# Patient Record
Sex: Male | Born: 1955
Health system: Southern US, Community
[De-identification: ages and names within clinical notes are randomized; demographics above are authoritative.]

## PROBLEM LIST (undated history)

## (undated) DIAGNOSIS — I77810 Thoracic aortic ectasia: Secondary | ICD-10-CM

## (undated) DIAGNOSIS — I4892 Unspecified atrial flutter: Secondary | ICD-10-CM

## (undated) DIAGNOSIS — G4733 Obstructive sleep apnea (adult) (pediatric): Secondary | ICD-10-CM

## (undated) DIAGNOSIS — I420 Dilated cardiomyopathy: Secondary | ICD-10-CM

## (undated) DIAGNOSIS — I4819 Other persistent atrial fibrillation: Secondary | ICD-10-CM

## (undated) DIAGNOSIS — I10 Essential (primary) hypertension: Secondary | ICD-10-CM

## (undated) HISTORY — DX: Obstructive sleep apnea (adult) (pediatric): G47.33

## (undated) HISTORY — DX: Thoracic aortic ectasia: I77.810

## (undated) HISTORY — DX: Unspecified atrial flutter: I48.92

## (undated) HISTORY — DX: Other persistent atrial fibrillation: I48.19

## (undated) HISTORY — DX: Dilated cardiomyopathy: I42.0

## (undated) HISTORY — DX: Morbid (severe) obesity due to excess calories: E66.01

## (undated) HISTORY — DX: Essential (primary) hypertension: I10

---

## 1999-02-15 ENCOUNTER — Emergency Department (HOSPITAL_COMMUNITY): Admission: EM | Admit: 1999-02-15 | Discharge: 1999-02-15 | Payer: Self-pay | Admitting: Emergency Medicine

## 2000-05-20 ENCOUNTER — Encounter: Payer: Self-pay | Admitting: Internal Medicine

## 2000-05-20 ENCOUNTER — Encounter: Admission: RE | Admit: 2000-05-20 | Discharge: 2000-05-20 | Payer: Self-pay | Admitting: Internal Medicine

## 2000-09-25 ENCOUNTER — Inpatient Hospital Stay (HOSPITAL_COMMUNITY): Admission: EM | Admit: 2000-09-25 | Discharge: 2000-09-27 | Payer: Self-pay | Admitting: Emergency Medicine

## 2000-09-25 ENCOUNTER — Encounter: Payer: Self-pay | Admitting: Emergency Medicine

## 2004-04-10 ENCOUNTER — Ambulatory Visit (HOSPITAL_COMMUNITY): Admission: RE | Admit: 2004-04-10 | Discharge: 2004-04-10 | Payer: Self-pay | Admitting: Gastroenterology

## 2012-04-27 ENCOUNTER — Ambulatory Visit (HOSPITAL_BASED_OUTPATIENT_CLINIC_OR_DEPARTMENT_OTHER): Payer: PRIVATE HEALTH INSURANCE

## 2012-04-28 ENCOUNTER — Ambulatory Visit (HOSPITAL_BASED_OUTPATIENT_CLINIC_OR_DEPARTMENT_OTHER): Payer: PRIVATE HEALTH INSURANCE | Attending: Internal Medicine | Admitting: Radiology

## 2012-04-28 VITALS — Ht 72.0 in | Wt 250.0 lb

## 2012-04-28 DIAGNOSIS — G4733 Obstructive sleep apnea (adult) (pediatric): Secondary | ICD-10-CM | POA: Insufficient documentation

## 2012-04-28 DIAGNOSIS — Z6833 Body mass index (BMI) 33.0-33.9, adult: Secondary | ICD-10-CM | POA: Insufficient documentation

## 2012-04-28 DIAGNOSIS — E669 Obesity, unspecified: Secondary | ICD-10-CM | POA: Insufficient documentation

## 2012-05-03 DIAGNOSIS — G4733 Obstructive sleep apnea (adult) (pediatric): Secondary | ICD-10-CM

## 2012-05-03 DIAGNOSIS — R0609 Other forms of dyspnea: Secondary | ICD-10-CM

## 2012-05-03 DIAGNOSIS — R0989 Other specified symptoms and signs involving the circulatory and respiratory systems: Secondary | ICD-10-CM

## 2012-05-04 NOTE — Procedures (Cosign Needed)
NAME:  Colin [name], Colin [name]                ACCOUNT NO.:  [id number]  MEDICAL RECORD NO.:  [id number]          PATIENT TYPE:  OUT  LOCATION:  SLEEP CENTER                 FACILITY:  [facility]  PHYSICIAN:  Clinton D. Maple Hudson, MD, FCCP, FACPDATE OF BIRTH:  12-07-56  DATE OF STUDY:  04/28/2012                           NOCTURNAL POLYSOMNOGRAM  REFERRING PHYSICIAN:  Merlene Laughter. Renae Gloss, M.D.  INDICATION FOR STUDY:  Hypersomnia with sleep apnea.  EPWORTH SLEEPINESS SCORE:  6/24.  BMI 33.9, weight 250 pounds, height 72 inches, neck 18 inches.  MEDICATIONS:  Home medications are charted and reviewed.  SLEEP ARCHITECTURE:  Split study protocol.  During the diagnostic phase, total sleep time 204.5 minutes with sleep efficiency 87.8%.  Stage I was 5.6%, stage II 72.6%, stage III absent, REM 21.8% of total sleep time. Sleep latency 11 minutes, REM latency 36.5 minutes, awake after sleep onset 12.5 minutes.  Arousal index 13.8.  BEDTIME MEDICATION:  None.  RESPIRATORY DATA:  Split study protocol.  Apnea/hypopnea index (AHI) 14.7 per hour.  A total of 50 events was scored including 2 obstructive apneas and 48 hypopneas.  Events were not positional.  REM AHI 45.8 per hour.  CPAP was titrated to 15 CWP, AHI 0 per hour.  He wore a medium ResMed Mirage Quattro full-face mask with heated humidifier.  OXYGEN DATA:  Before CPAP, snoring was moderately loud with oxygen desaturation to a nadir of 84% on room air.  With CPAP titration, snoring was prevented and mean oxygen saturation held 95.7% on room air.  CARDIAC DATA:  Sinus rhythm with PACs.  MOVEMENT-PARASOMNIA:  No significant movement disturbance.  Bathroom x1.  IMPRESSIONS-RECOMMENDATIONS: 1. Mild obstructive sleep apnea/hypopnea syndrome, AHI 14.7 per hour     with non-positional events.  Moderately loud snoring with oxygen     desaturation to a nadir of 84% on room air. 2. Successful CPAP titration to 15 CWP, AHI 0 per hour.  He wore a     medium  ResMed Mirage Quattro full-face mask with heated humidifier.     Snoring was prevented and mean oxygen saturation held 95.7% on room     air.     Clinton D. Maple Hudson, MD, [facility], FACP Diplomate, American Board of Sleep Medicine    CDY/MEDQ  D:  05/03/2012 16:35:11  T:  05/04/2012 00:54:34  Job:  [id number]

## 2014-09-14 ENCOUNTER — Encounter (HOSPITAL_COMMUNITY): Payer: Self-pay | Admitting: Nurse Practitioner

## 2014-09-14 ENCOUNTER — Other Ambulatory Visit (HOSPITAL_COMMUNITY): Payer: Self-pay | Admitting: *Deleted

## 2014-09-14 ENCOUNTER — Ambulatory Visit (HOSPITAL_COMMUNITY)
Admission: RE | Admit: 2014-09-14 | Discharge: 2014-09-14 | Disposition: A | Discharge status: Home / Self Care | Payer: PRIVATE HEALTH INSURANCE | Source: Ambulatory Visit | Attending: Nurse Practitioner | Admitting: Nurse Practitioner

## 2014-09-14 ENCOUNTER — Other Ambulatory Visit: Payer: Self-pay

## 2014-09-14 VITALS — BP 150/98 | HR 72 | Ht 72.0 in | Wt 252.8 lb

## 2014-09-14 DIAGNOSIS — I4891 Unspecified atrial fibrillation: Secondary | ICD-10-CM | POA: Insufficient documentation

## 2014-09-14 DIAGNOSIS — G4733 Obstructive sleep apnea (adult) (pediatric): Secondary | ICD-10-CM | POA: Diagnosis not present

## 2014-09-14 DIAGNOSIS — R030 Elevated blood-pressure reading, without diagnosis of hypertension: Secondary | ICD-10-CM | POA: Insufficient documentation

## 2014-09-14 DIAGNOSIS — I4892 Unspecified atrial flutter: Secondary | ICD-10-CM

## 2014-09-14 DIAGNOSIS — Z87891 Personal history of nicotine dependence: Secondary | ICD-10-CM | POA: Diagnosis not present

## 2014-09-14 LAB — BASIC METABOLIC PANEL
ANION GAP: 10 (ref 5–15)
BUN: 13 mg/dL (ref 6–20)
CHLORIDE: 104 mmol/L (ref 101–111)
CO2: 25 mmol/L (ref 22–32)
CREATININE: 1.14 mg/dL (ref 0.61–1.24)
Calcium: 9.7 mg/dL (ref 8.9–10.3)
Glucose, Bld: 208 mg/dL — ABNORMAL HIGH (ref 70–99)
Potassium: 4.3 mmol/L (ref 3.5–5.1)
Sodium: 139 mmol/L (ref 135–145)

## 2014-09-14 LAB — CBC
HEMATOCRIT: 45.2 % (ref 39.0–52.0)
Hemoglobin: 15.3 g/dL (ref 13.0–17.0)
MCH: 28 pg (ref 26.0–34.0)
MCHC: 33.8 g/dL (ref 30.0–36.0)
MCV: 82.8 fL (ref 78.0–100.0)
Platelets: 224 10*3/uL (ref 150–400)
RBC: 5.46 MIL/uL (ref 4.22–5.81)
RDW: 14.6 % (ref 11.5–15.5)
WBC: 7.1 10*3/uL (ref 4.0–10.5)

## 2014-09-14 MED ORDER — APIXABAN 5 MG PO TABS: 5.0000 mg | ORAL_TABLET | Freq: Two times a day (BID) | ORAL | Status: DC

## 2014-09-14 NOTE — Patient Instructions (Signed)
Your physician has recommended you make the following change in your medication:  1)Stop Aspirin 2)Start Eliquis 5mg  twice a day  Your physician has requested that you have an echocardiogram. Echocardiography is a painless test that uses sound waves to create images of your heart. It provides your doctor with information about the size and shape of your heart and how well your heart's chambers and valves are working. This procedure takes approximately one hour. There are no restrictions for this procedure.  Cardioversion instructions:  Scheduled for May 20th at 2pm.  You will need [facility] Do NOT eat or drink anything after midnight. Do NOT take your diabetes medication (actos, invomet). You can take your other medications with a sip of water.

## 2014-09-15 NOTE — Progress Notes (Signed)
Name: Colin [name] DOB: 10/20/1955     MRN: [id number]   Primary Care Physician: Dr. [facility]sel[facility]arks [facility] Primary Electrophysiologist: Referring Physician: Same   Consepcion HearingBruce E [name] is a 59 y.o. male with a h/o new onset atrial flutter found on Monday at time of regular physical exam. HR per pt initially in the 120's,  who [facility]. He is unsure as to how long this has been present, but did see his PCP 3 months ago and arrythmia was not present at that time. The patient is asymptomatic. He does have sleep apnea but has not been using CPAP for sometime. Positive sleep study in 2013. Additional obesity and nor exercise program. No tobacco or alcohol use. No OTC stimulents, no high caffeine use. Remote history of stent for CAD in 2002? ASA was added at 325 mg and metoprolol at 50 mg bid. Today EKG shows a typical a. Flutter with variable block at 72 bpm. He denies any bleeding history and has a CHA2DS2-VASc Score of  at least 2 for DM and CAD with maybe a 3 for HTN with BP being elevated today but he denies history of same.  Today, he denies symptoms of palpitations, chest pain, shortness of breath, orthopnea, PND, lower extremity edema, dizziness, presyncope, syncope, snoring, daytime somnolence, bleeding, or neurologic sequela. The patient is tolerating medications without difficulties and is otherwise without complaint today.    Atrial Fibrillation Risk Factors:  He does have symptoms or diagnosis of sleep apnea. He  is not compliant with CPAP therapy.  He does not have a history of rheumatic fever.  He does not have a history of alcohol use.  Atrial Fibrillation Management history:  Previous antiarrhythmic drugs: none  Previous cardioversions: none  Previous ablations: none  CHADS2VASC score: 2/3  Anticoagulation history:asa    No Known Allergies  History   Social History  . Marital Status: Married    Spouse  Name: N/A  . Number of Children: N/A  . Years of Education: N/A   Occupational History  . Not on file.   Social History Main Topics  . Smoking status: Former Games developermoker  . Smokeless tobacco: Not on file  . Alcohol Use: Not on file  . Drug Use: Not on file  . Sexual Activity: Not on file   Other Topics Concern  . Not on file   Social History Narrative  . No narrative on file    No family history on file. The patient does not have a history of early familial atrial fibrillation or other arrhythmias.  ROS- All systems are reviewed and negative except as per the HPI above.  Physical Exam: Filed Vitals:   09/14/14 1514  BP: 150/98  Pulse: 72  Height: 6' (1.829 m)  Weight: 252 lb 12.8 oz (114.669 kg)    GEN- The patient is well appearing, alert and oriented x 3 today.   Head- normocephalic, atraumatic Eyes-  Sclera clear, conjunctiva pink Ears- hearing intact Oropharynx- clear Neck- supple, no JVP Lymph- no cervical lymphadenopathy Lungs- Clear to ausculation bilaterally, normal work of breathing Heart- Slightly irregular rate and rhythm, no murmurs, rubs or gallops, PMI not laterally displaced GI- soft, NT, ND, + BS Extremities- no clubbing, cyanosis, or edema MS- no significant deformity or atrophy Skin- no rash or lesion Psych- euthymic mood, full affect Neuro- strength and sensation are intact  EKG Typical aflutter with variable block with HR 72 bpm, LAD, septal infarct,  age indetermined, t wave abnormality. Echo none found   BP Readings from Last 3 Encounters:  09/14/14 150/98   Wt Readings from Last 3 Encounters:  09/14/14 252 lb 12.8 oz (114.669 kg)  04/28/12 250 lb (113.399 kg)   He has a BMI of Body mass index is 34.28 kg/(m^2)..  CBC Latest Ref Rng 09/14/2014  WBC 4.0 - 10.5 K/uL 7.1  Hemoglobin 13.0 - 17.0 g/dL 15.3  Hematocrit 39.0 - 52.0 % 45.2  Platelets 150 - 400 K/uL 224   BMP Latest Ref Rng 09/14/2014  Glucose 70 - 99 mg/dL 208(H)  BUN 6 -  20 mg/dL 13  Creatinine 0.61 - 1.24 mg/dL 1.14  Sodium 135 - 145 mmol/L 139  Potassium 3.5 - 5.1 mmol/L 4.3  Chloride 101 - 111 mmol/L 104  CO2 22 - 32 mmol/L 25  Calcium 8.9 - 10.3 mg/dL 9.7    Lipid Panel  No results found for: CHOL, TRIG, HDL, CHOLHDL, VLDL, LDLCALC, LDLDIRECT   BNP (last 3 results) No results for input(s): BNP in the last 8760 hours.  [facility] are [facility] length today.  Assessment and Plan:  1. Atrial fibrillation The patient has Asymptomatic atrial flutter.   He  is not  appropriately anticoagulated at this time. The patient is adequately rate controlled with metoprolol. Antiarrhythmic therapy to dates has included none  Echo not available and will be ordered. A long discussion with the patient was had today regarding therapeutic strategies.  Extensive discussion of lifestyle modification including begin progressive daily aerobic exercise program, follow a low fat, low cholesterol diet, attempt to lose weight, reduce salt in diet and cooking and continue current medications was also discussed.  Presently, our recommendations include  1. New onset typical a. Flutter Stop ASA and start eliquis 5 mg bid. Pt was informed of risk vrs benefit of all available anticoagulants.Denies bleeding history.  Continue metoprolol. Echocardiogram to look for structural abnormalities.  2. Morbid obesity As above, lifestyle modification was discussed at length including regular exercise and weight reduction.  3. Obstructive sleep apnea The importance of adequate treatment of sleep apnea was discussed today in order to improve our ability to maintain sinus rhythm long term.Pt is willing to restart cpap.  4. Elevated BP today but pt denies DX of HTN Avoid salt.  5. Will be scheduled for TEE/DCCV early next week.  Peni Rupard NP  Nurse Practitioner, [facility] 09/15/2014 8:01 AM     

## 2014-09-16 ENCOUNTER — Ambulatory Visit (HOSPITAL_COMMUNITY)
Admission: RE | Admit: 2014-09-16 | Discharge: 2014-09-16 | Disposition: A | Discharge status: Home / Self Care | Payer: PRIVATE HEALTH INSURANCE | Source: Ambulatory Visit | Attending: Nurse Practitioner | Admitting: Nurse Practitioner

## 2014-09-16 DIAGNOSIS — I080 Rheumatic disorders of both mitral and aortic valves: Secondary | ICD-10-CM | POA: Diagnosis not present

## 2014-09-16 DIAGNOSIS — I4892 Unspecified atrial flutter: Secondary | ICD-10-CM | POA: Diagnosis not present

## 2014-09-16 NOTE — Progress Notes (Signed)
*  PRELIMINARY RESULTS* Echocardiogram 2D Echocardiogram has been performed.  Jeryl Columbialliott, Mahealani Sulak 09/16/2014, 10:02 AM

## 2014-09-17 ENCOUNTER — Telehealth (HOSPITAL_COMMUNITY): Payer: Self-pay | Admitting: *Deleted

## 2014-09-17 NOTE — Telephone Encounter (Signed)
No preauthorization required for TEE/DCCV - [facility]

## 2014-09-20 ENCOUNTER — Telehealth (HOSPITAL_COMMUNITY): Payer: Self-pay | Admitting: Nurse Practitioner

## 2014-09-20 NOTE — Telephone Encounter (Signed)
I told pt results of echo. Tried to find old echo to compare when he was an pt of [facility], [facility] and spoke to medical records. Can not find in system. Pt does not remember if he had reduced EF in past but has had an old MI with previous stent.

## 2014-09-24 ENCOUNTER — Ambulatory Visit (HOSPITAL_COMMUNITY): Payer: PRIVATE HEALTH INSURANCE | Admitting: Anesthesiology

## 2014-09-24 ENCOUNTER — Encounter (HOSPITAL_COMMUNITY): Payer: Self-pay | Admitting: Anesthesiology

## 2014-09-24 ENCOUNTER — Ambulatory Visit (HOSPITAL_COMMUNITY)
Admission: RE | Admit: 2014-09-24 | Discharge: 2014-09-24 | Disposition: A | Discharge status: Home / Self Care | Payer: PRIVATE HEALTH INSURANCE | Source: Ambulatory Visit | Attending: Cardiology | Admitting: Cardiology

## 2014-09-24 ENCOUNTER — Encounter (HOSPITAL_COMMUNITY)
Admission: RE | Disposition: A | Discharge status: Home / Self Care | Payer: Self-pay | Source: Ambulatory Visit | Attending: Cardiology

## 2014-09-24 ENCOUNTER — Other Ambulatory Visit: Payer: Self-pay

## 2014-09-24 DIAGNOSIS — I4892 Unspecified atrial flutter: Secondary | ICD-10-CM | POA: Diagnosis not present

## 2014-09-24 DIAGNOSIS — Z87891 Personal history of nicotine dependence: Secondary | ICD-10-CM | POA: Insufficient documentation

## 2014-09-24 DIAGNOSIS — I483 Typical atrial flutter: Secondary | ICD-10-CM | POA: Insufficient documentation

## 2014-09-24 DIAGNOSIS — I4891 Unspecified atrial fibrillation: Secondary | ICD-10-CM | POA: Diagnosis not present

## 2014-09-24 DIAGNOSIS — I351 Nonrheumatic aortic (valve) insufficiency: Secondary | ICD-10-CM

## 2014-09-24 DIAGNOSIS — G4733 Obstructive sleep apnea (adult) (pediatric): Secondary | ICD-10-CM | POA: Diagnosis not present

## 2014-09-24 HISTORY — PX: TEE WITHOUT CARDIOVERSION: SHX5443

## 2014-09-24 HISTORY — PX: CARDIOVERSION: SHX1299

## 2014-09-24 LAB — GLUCOSE, CAPILLARY: Glucose-Capillary: 154 mg/dL — ABNORMAL HIGH (ref 65–99)

## 2014-09-24 SURGERY — ECHOCARDIOGRAM, TRANSESOPHAGEAL
Anesthesia: Monitor Anesthesia Care

## 2014-09-24 MED ORDER — BUTAMBEN-TETRACAINE-BENZOCAINE 2-2-14 % EX AERO
INHALATION_SPRAY | CUTANEOUS | Status: DC | PRN
  Administered 2014-09-24: 2 via TOPICAL

## 2014-09-24 MED ORDER — SODIUM CHLORIDE 0.9 % IV SOLN
INTRAVENOUS | Status: DC | PRN
  Administered 2014-09-24: 10:00:00 via INTRAVENOUS

## 2014-09-24 MED ORDER — LIDOCAINE VISCOUS 2 % MT SOLN
OROMUCOSAL | Status: DC | PRN
  Administered 2014-09-24: 10 mL via OROMUCOSAL

## 2014-09-24 MED ORDER — PROPOFOL INFUSION 10 MG/ML OPTIME
INTRAVENOUS | Status: DC | PRN
  Administered 2014-09-24: 75 ug/kg/min via INTRAVENOUS

## 2014-09-24 MED ORDER — PROPOFOL 10 MG/ML IV BOLUS
INTRAVENOUS | Status: DC | PRN
  Administered 2014-09-24: 50 mg via INTRAVENOUS
  Administered 2014-09-24: 75 mg via INTRAVENOUS

## 2014-09-24 MED ORDER — MIDAZOLAM HCL 5 MG/5ML IJ SOLN
INTRAMUSCULAR | Status: DC | PRN
  Administered 2014-09-24: 2 mg via INTRAVENOUS

## 2014-09-24 MED ORDER — LIDOCAINE VISCOUS 2 % MT SOLN
OROMUCOSAL | Status: AC
  Filled 2014-09-24: qty 15

## 2014-09-24 MED ORDER — LACTATED RINGERS IV SOLN
INTRAVENOUS | Status: DC
  Administered 2014-09-24: 09:00:00 via INTRAVENOUS

## 2014-09-24 NOTE — CV Procedure (Signed)
       PROCEDURE NOTE  Procedure:  Transesophageal echocardiogram Operator:  Armanda Magicraci Turner, MD Indications:  Atrial Fibrillation Complications:  None IV Meds: Propofol 150mg  IV  Results: Moderately reduced LVF with EF 35-40%.  Spontaneous echo contrast was noted. Normal RV size and function Moderately dilated RA with spontaneous echo contrast Moderately dilated LA with spontaneous echo contrast  Normal sized LA appendage with mild spontaneous echo contrast at the mouth of the appendage but no evidence of thrombus in the LA or LAA.  Emptying velocity 30-40. Normal TV with mild to moderate TR Normal MV with mild MR Normal PV with trivial PR Trileaflet AV with mildly calcified AV leaflets and mild trivial AR Normal interatrial septum with ? Very small PFO - agitated saline contrast injection showed ? Shunt on 5th cardiac cycle. Normal thoracic and ascending aorta.   The patient tolerated the procedure well and went on to DCCV.        Electrical Cardioversion Procedure Note Sandi CarneBruce E Dahmen [id number] 09/07/1955  Procedure: Electrical Cardioversion Indications:  Atrial Flutter  Time Out: Verified patient identification, verified procedure,medications/allergies/relevent history reviewed, required imaging and test results available.  Performed  Procedure Details  The patient was NPO after midnight. Anesthesia was administered at the beside  by Dr.Singer with 75mg  of propofol.  Cardioversion was done with synchronized biphasic defibrillation with AP pads with 150watts.  The patient converted to normal sinus rhythm. The patient tolerated the procedure well   IMPRESSION:  Successful cardioversion of atrial flutter    TURNER,TRACI R 09/24/2014, 10:29 AM

## 2014-09-24 NOTE — Anesthesia Preprocedure Evaluation (Addendum)
Anesthesia Evaluation  Patient identified by MRN, date of birth, ID band Patient awake    Reviewed: Allergy & Precautions, NPO status , Patient's Chart, lab work & pertinent test results  History of Anesthesia Complications Negative for: history of anesthetic complications  Airway Mallampati: II  TM Distance: >3 FB Neck ROM: Full    Dental  (+) Teeth Intact, Dental Advisory Given   Pulmonary former smoker,    Pulmonary exam normal       Cardiovascular + dysrhythmias Atrial Fibrillation Rhythm:Regular     Neuro/Psych negative neurological ROS  negative psych ROS   GI/Hepatic Neg liver ROS,   Endo/Other  diabetesMorbid obesity  Renal/GU      Musculoskeletal   Abdominal   Peds  Hematology   Anesthesia Other Findings   Reproductive/Obstetrics                            Anesthesia Physical Anesthesia Plan  ASA: III  Anesthesia Plan: MAC   Post-op Pain Management:    Induction: Intravenous  Airway Management Planned: Nasal Cannula  Additional Equipment:   Intra-op Plan:   Post-operative Plan:   Informed Consent: I have reviewed the patients History and Physical, chart, labs and discussed the procedure including the risks, benefits and alternatives for the proposed anesthesia with the patient or authorized representative who has indicated his/her understanding and acceptance.   Dental advisory given and Dental Advisory Given  Plan Discussed with: CRNA, Anesthesiologist and Surgeon  Anesthesia Plan Comments:        Anesthesia Quick Evaluation

## 2014-09-24 NOTE — Anesthesia Postprocedure Evaluation (Signed)
  Anesthesia Post-op Note  Patient: Colin [name]  Procedure(s) Performed: Procedure(s): TRANSESOPHAGEAL ECHOCARDIOGRAM (TEE) (N/A) CARDIOVERSION (N/A)  Patient Location: Endoscopy Unit  Anesthesia Type:MAC  Level of Consciousness: awake, alert  and oriented  Airway and Oxygen Therapy: Patient Spontanous Breathing and Patient connected to nasal cannula oxygen  Post-op Pain: none  Post-op Assessment: Post-op Vital signs reviewed, Patient's Cardiovascular Status Stable, Respiratory Function Stable, Patent Airway, No signs of Nausea or vomiting, Adequate PO intake and Pain level controlled  Post-op Vital Signs: Reviewed and stable  Last Vitals:  Filed Vitals:   09/24/14 1034  BP: 121/91  Pulse:   Temp:   Resp: 28    Complications: No apparent anesthesia complications

## 2014-09-24 NOTE — Interval H&P Note (Signed)
History and Physical Interval Note:  09/24/2014 9:39 AM  Colin [name]  has presented today for surgery, with the diagnosis of AFLUTTER  The various methods of treatment have been discussed with the patient and family. After consideration of risks, benefits and other options for treatment, the patient has consented to  Procedure(s): TRANSESOPHAGEAL ECHOCARDIOGRAM (TEE) (N/A) CARDIOVERSION (N/A) as a surgical intervention .  The patient's history has been reviewed, patient examined, no change in status, stable for surgery.  I have reviewed the patient's chart and labs.  Questions were answered to the patient's satisfaction.     [name] R

## 2014-09-24 NOTE — H&P (View-Only) (Signed)
Name: Colin [name] DOB: 10/20/1955     MRN: [id number]   Primary Care Physician: Dr. [facility]selm [facility]Starks [name] Primary Electrophysiologist: Referring Physician: Same   Consepcion HearingBruce E [name] is a 59 y.o. male with a h/o new onset atrial flutter found on Monday at time of regular physical exam. HR per pt initially in the 120's,  who [facility]. He is unsure as to how long this has been present, but did see his PCP 3 months ago and arrythmia was not present at that time. The patient is asymptomatic. He does have sleep apnea but has not been using CPAP for sometime. Positive sleep study in 2013. Additional obesity and nor exercise program. No tobacco or alcohol use. No OTC stimulents, no high caffeine use. Remote history of stent for CAD in 2002? ASA was added at 325 mg and metoprolol at 50 mg bid. Today EKG shows a typical a. Flutter with variable block at 72 bpm. He denies any bleeding history and has a CHA2DS2-VASc Score of  at least 2 for DM and CAD with maybe a 3 for HTN with BP being elevated today but he denies history of same.  Today, he denies symptoms of palpitations, chest pain, shortness of breath, orthopnea, PND, lower extremity edema, dizziness, presyncope, syncope, snoring, daytime somnolence, bleeding, or neurologic sequela. The patient is tolerating medications without difficulties and is otherwise without complaint today.    Atrial Fibrillation Risk Factors:  He does have symptoms or diagnosis of sleep apnea. He  is not compliant with CPAP therapy.  He does not have a history of rheumatic fever.  He does not have a history of alcohol use.  Atrial Fibrillation Management history:  Previous antiarrhythmic drugs: none  Previous cardioversions: none  Previous ablations: none  CHADS2VASC score: 2/3  Anticoagulation history:asa    No Known Allergies  History   Social History  . Marital Status: Married    Spouse  Name: N/A  . Number of Children: N/A  . Years of Education: N/A   Occupational History  . Not on file.   Social History Main Topics  . Smoking status: Former Games developermoker  . Smokeless tobacco: Not on file  . Alcohol Use: Not on file  . Drug Use: Not on file  . Sexual Activity: Not on file   Other Topics Concern  . Not on file   Social History Narrative  . No narrative on file    No family history on file. The patient does not have a history of early familial atrial fibrillation or other arrhythmias.  ROS- All systems are reviewed and negative except as per the HPI above.  Physical Exam: Filed Vitals:   09/14/14 1514  BP: 150/98  Pulse: 72  Height: 6' (1.829 m)  Weight: 252 lb 12.8 oz (114.669 kg)    GEN- The patient is well appearing, alert and oriented x 3 today.   Head- normocephalic, atraumatic Eyes-  Sclera clear, conjunctiva pink Ears- [name] intact Oropharynx- clear Neck- supple, no JVP Lymph- no cervical lymphadenopathy Lungs- Clear to ausculation bilaterally, normal work of breathing Heart- Slightly irregular rate and rhythm, no murmurs, rubs or gallops, PMI not laterally displaced GI- soft, NT, ND, + BS Extremities- no clubbing, cyanosis, or edema MS- no significant deformity or atrophy Skin- no rash or lesion Psych- euthymic mood, full affect Neuro- strength and sensation are intact  EKG Typical aflutter with variable block with HR 72 bpm, LAD, septal infarct,  age indetermined, t wave abnormality. Echo none found   BP Readings from Last 3 Encounters:  09/14/14 150/98   Wt Readings from Last 3 Encounters:  09/14/14 252 lb 12.8 oz (114.669 kg)  04/28/12 250 lb (113.399 kg)   He has a BMI of Body mass index is 34.28 kg/(m^2)..  CBC Latest Ref Rng 09/14/2014  WBC 4.0 - 10.5 K/uL 7.1  Hemoglobin 13.0 - 17.0 g/dL 16.115.3  Hematocrit 09.639.0 - 52.0 % 45.2  Platelets 150 - 400 K/uL 224   BMP Latest Ref Rng 09/14/2014  Glucose 70 - 99 mg/dL 045(W208(H)  BUN 6 -  20 mg/dL 13  Creatinine 0.980.61 - 1.191.24 mg/dL 1.471.14  Sodium 829135 - 562145 mmol/L 139  Potassium 3.5 - 5.1 mmol/L 4.3  Chloride 101 - 111 mmol/L 104  CO2 22 - 32 mmol/L 25  Calcium 8.9 - 10.3 mg/dL 9.7    Lipid Panel  No results found for: CHOL, TRIG, HDL, CHOLHDL, VLDL, LDLCALC, LDLDIRECT   BNP (last 3 results) No results for input(s): BNP in the last 8760 hours.  [facility] are [facility] at length today.  Assessment and Plan:  1. Atrial fibrillation The patient has Asymptomatic atrial flutter.   He  is not  appropriately anticoagulated at this time. The patient is adequately rate controlled with metoprolol. Antiarrhythmic therapy to dates has included none  Echo not available and will be ordered. A long discussion with the patient was had today regarding therapeutic strategies.  Extensive discussion of lifestyle modification including begin progressive daily aerobic exercise program, follow a low fat, low cholesterol diet, attempt to lose weight, reduce salt in diet and cooking and continue current medications was also discussed.  Presently, our recommendations include  1. New onset typical a. Flutter Stop ASA and start eliquis 5 mg bid. Pt was informed of risk vrs benefit of all available anticoagulants.Denies bleeding history.  Continue metoprolol. Echocardiogram to look for structural abnormalities.  2. Morbid obesity As above, lifestyle modification was discussed at length including regular exercise and weight reduction.  3. Obstructive sleep apnea The importance of adequate treatment of sleep apnea was discussed today in order to improve our ability to maintain sinus rhythm long term.Pt is willing to restart cpap.  4. Elevated BP today but pt denies DX of HTN Avoid salt.  5. Will be scheduled for TEE/DCCV early next week.  Colin Cocoonna Abria Vannostrand NP  Nurse Practitioner, [facility] 09/15/2014 8:01 AM

## 2014-09-24 NOTE — Transfer of Care (Signed)
Immediate Anesthesia Transfer of Care Note  Patient: Colin [name]  Procedure(s) Performed: Procedure(s): TRANSESOPHAGEAL ECHOCARDIOGRAM (TEE) (N/A) CARDIOVERSION (N/A)  Patient Location: Endoscopy Unit  Anesthesia Type:MAC  Level of Consciousness: awake, alert  and oriented  Airway & Oxygen Therapy: Patient Spontanous Breathing and Patient connected to nasal cannula oxygen  Post-op Assessment: Report given to RN, Post -op Vital signs reviewed and stable and Patient moving all extremities X 4  Post vital signs: Reviewed and stable  Last Vitals:  Filed Vitals:   09/24/14 1034  BP: 121/91  Pulse:   Temp:   Resp: 28    Complications: No apparent anesthesia complications

## 2014-09-24 NOTE — Anesthesia Procedure Notes (Signed)
Procedure Name: MAC Date/Time: 09/24/2014 10:27 AM Performed by: Carmela RimaMARTINELLI, Josia Cueva F Pre-anesthesia Checklist: Patient being monitored, Suction available, Emergency Drugs available, Patient identified and Timeout performed Patient Re-evaluated:Patient Re-evaluated prior to inductionOxygen Delivery Method: Nasal cannula Placement Confirmation: positive ETCO2

## 2014-09-24 NOTE — Progress Notes (Signed)
Timeout was performed at 1032 but it was not verified completely so recorded at 1054. Timeout was documented at 1032 see timeout. Smonday Rn

## 2014-09-24 NOTE — Progress Notes (Signed)
Echocardiogram Echocardiogram Transesophageal has been performed.  Colin [name], Colin [name] 09/24/2014, 1:41 PM

## 2014-09-24 NOTE — Discharge Instructions (Signed)
Transesophageal Echocardiogram Transesophageal echocardiography (TEE) is a picture test of your heart using sound waves. The pictures taken can give very detailed pictures of your heart. This can help your doctor see if there are problems with your heart. TEE can check:  If your heart has blood clots in it.  How well your heart valves are working.  If you have an infection on the inside of your heart.  Some of the major arteries of your heart.  If your heart valve is working after a Psychologist, forensicrepair.  Your heart before a procedure that uses a shock to your heart to get the rhythm back to normal. BEFORE THE PROCEDURE  Do not eat or drink for 6 hours before the procedure or as told by your doctor.  Make plans to have someone drive you home after the procedure. Do not drive yourself home.  An IV tube will be put in your arm. PROCEDURE  You will be given a medicine to help you relax (sedative). It will be given through the IV tube.  A numbing medicine will be sprayed or gargled in the back of your throat to help numb it.  The tip of the probe is placed into the back of your mouth. You will be asked to swallow. This helps to pass the probe into your esophagus.  Once the tip of the probe is in the right place, your doctor can take pictures o    f your heart.    You may feel pressure at the back of your throat. AFTER THE PROCEDURE  You will be taken to a recovery area so the sedative can wear off.  Your throat may be sore and scratchy. This will go away slowly over time.  You will go home when you are fully awake and able to swallow liquids.  You should have someone stay with you for the next 24 hours.  Do not drive or operate machinery for the next 24 hours. Document Released: 02/18/2009 Document Revised: 04/28/2013 Document Reviewed: 10/23/2012    Electrical Cardioversion, Care After Refer to this sheet in the next few weeks. These instructions provide you with information on  caring for yourself after your procedure. Your health care provider may also give you more specific instructions. Your treatment has been planned according to current medical practices, but problems sometimes occur. Call your health care provider if you have any problems or questions after your procedure. WHAT TO EXPECT AFTER THE PROCEDURE After your procedure, it is typical to have the following sensations:  Some redness on the skin where the shocks were delivered. If this is tender, a sunburn lotion or hydrocortisone cream may help.  Possible return of an abnormal heart rhythm within hours or days after the procedure. HOME CARE INSTRUCTIONS  Take medicines only as directed by your health care provider. Be sure you understand how and when to take your medicine.  Learn how to feel your pulse and check it often.  Limit your activity for 48 hours after the procedure or as directed by your health care provider.  Avoid or minimize caffeine and other stimulants as directed by your health care provider. SEEK MEDICAL CARE IF:  You feel like your heart is beating too fast or your pulse is not regular.  You have any questions about your medicines.  You have bleeding that will not stop. SEEK IMMEDIATE MEDICAL CARE IF:  You are dizzy or feel faint.  It is hard to breathe or you feel short of breath.  There is a change in discomfort in your chest.  Your speech is slurred or you have trouble moving an arm or leg on one side of your body.  You get a serious muscle cramp that does not go away.  Your fingers or toes turn cold or blue. Document Released: 02/11/2013 Document Revised: 09/07/2013 Document Reviewed: 02/11/2013 [facility] Patient Information 2015 [place], [place]. This information is not intended to replace advice given to you by your health care provider. Make sure you discuss any questions you have with your health care provider.  ExitCare Patient Information 2015 [place], [place].  This information is not intended to replace advice given to you by your health care provider. Make sure you discuss any questions you have with your health care provider.

## 2014-09-27 ENCOUNTER — Encounter (HOSPITAL_COMMUNITY): Payer: Self-pay | Admitting: Cardiology

## 2014-09-30 ENCOUNTER — Ambulatory Visit (HOSPITAL_COMMUNITY)
Admission: RE | Admit: 2014-09-30 | Discharge: 2014-09-30 | Disposition: A | Discharge status: Home / Self Care | Payer: PRIVATE HEALTH INSURANCE | Source: Ambulatory Visit | Attending: Nurse Practitioner | Admitting: Nurse Practitioner

## 2014-09-30 ENCOUNTER — Encounter (HOSPITAL_COMMUNITY): Payer: Self-pay | Admitting: Nurse Practitioner

## 2014-09-30 ENCOUNTER — Other Ambulatory Visit (HOSPITAL_COMMUNITY): Payer: Self-pay | Admitting: *Deleted

## 2014-09-30 VITALS — BP 140/84 | HR 102 | Ht 72.0 in | Wt 251.4 lb

## 2014-09-30 DIAGNOSIS — I4892 Unspecified atrial flutter: Secondary | ICD-10-CM | POA: Insufficient documentation

## 2014-09-30 DIAGNOSIS — G4733 Obstructive sleep apnea (adult) (pediatric): Secondary | ICD-10-CM | POA: Diagnosis not present

## 2014-09-30 DIAGNOSIS — I10 Essential (primary) hypertension: Secondary | ICD-10-CM | POA: Diagnosis not present

## 2014-09-30 NOTE — Progress Notes (Signed)
Patient ID: Colin [name], male   DOB: 07/18/1955, 59 y.o.   MRN: [id number]   Name: Colin [name] DOB: 04/20/1956     MRN: [id number]   Primary Care Physician: [facility] Primary Electrophysiologist: None Referring Physician: Same   Colin [name] is a 59 y.o. male with a h/o new onset atrial flutter found on Monday at time of regular physical exam, asymptomatic. HR per pt initially in the 120's He is unsure as to how long this has been present, but did see his PCP 3 months ago and arrythmia was not present at that time. The patient is asymptomatic. He does have sleep apnea but has not been using CPAP for sometime. Positive sleep study in 2013. Additional obesity and no exercise program. No tobacco or alcohol use. No OTC stimulents, no high caffeine use. Remote history of stent for CAD in 2002?  ASA was added at 325 mg and metoprolol at 50 mg bid by PCP.   He denies any bleeding history and has a CHA2DS2-VASc Score of  at least 2 for DM and CAD with maybe a 3 for HTN with BP being elevated today but he denies history of same. He was placed on eliquis 5 mg bid and asa stopped.   He was set up for DCCV and was initially successful but unfortunately had ERAF , but pt was unaware until he was told today. Echo showed moderate LV dysfunction, EF 35-40%,? [facility] or from prior MI. Dr. Alanda AmassWeintraub was his former cardiologist and I did try to locate previous echo but to no avail. Left atrium 43 bpm. He states he has restarted using cpap.  Today, he denies symptoms of palpitations, chest pain, shortness of breath, orthopnea, PND, lower extremity edema, dizziness, presyncope, syncope, snoring, daytime somnolence, bleeding, or neurologic sequela. The patient is tolerating medications without difficulties and is otherwise without complaint today.    Atrial Fibrillation Risk Factors:  He does have symptoms or diagnosis of sleep apnea. He  is not compliant with CPAP therapy.  He does not  have a history of rheumatic fever.  He does not have a history of alcohol use.  Atrial Fibrillation Management history:  Previous antiarrhythmic drugs: none  Previous cardioversions: x1  Previous ablations: none  CHADS2VASC score: 2/3  Anticoagulation history:asa    No Known Allergies  History   Social History  . Marital Status: Married    Spouse Name: N/A  . Number of Children: N/A  . Years of Education: N/A   Occupational History  . Not on file.   Social History Main Topics  . Smoking status: Former Games developermoker  . Smokeless tobacco: Not on file  . Alcohol Use: Not on file  . Drug Use: Not on file  . Sexual Activity: Not on file   Other Topics Concern  . Not on file   Social History Narrative    No family history on file. The patient does not have a history of early familial atrial fibrillation or other arrhythmias.  ROS- All systems are reviewed and negative except as per the HPI above.  Physical Exam: Filed Vitals:   09/30/14 1558 09/30/14 1624  BP: 158/90 140/84  Pulse: 102   Height: 6' (1.829 m)   Weight: 251 lb 6.4 oz (114.034 kg)     GEN- The patient is well appearing, alert and oriented x 3 today.   Head- normocephalic, atraumatic Eyes-  Sclera clear, conjunctiva pink Ears- hearing intact Oropharynx- clear Neck-  supple, no JVP Lymph- no cervical lymphadenopathy Lungs- Clear to ausculation bilaterally, normal work of breathing Heart- Slightly irregular rate and rhythm, no murmurs, rubs or gallops, PMI not laterally displaced GI- soft, NT, ND, + BS Extremities- no clubbing, cyanosis, or edema MS- no significant deformity or atrophy Skin- no rash or lesion Psych- euthymic mood, full affect Neuro- strength and sensation are intact  EKG- Initial EKG Typical aflutter with variable block with HR 72 bpm, LAD, septal infarct, age indetermined, t wave abnormality. EKG today ?Aflutter/fib , 102 bpm, QRS 80 ms, Qtc 380 ms  Echo 5/12-Left ventricle:  The cavity size was normal. Wall thickness was increased in a pattern of mild LVH. Indeterminant diastolic function (atrial fibrillation). Diffuse hypokinesis, worst in the inferior wall. Systolic function was moderately reduced. The estimated ejection fraction was in the range of 35% to 40%. - Aortic valve: There was no stenosis. There was mild regurgitation. - Aorta: Borderline dilated aortic root. Aortic root dimension: 37 mm (ED). - Mitral valve: There was mild regurgitation. - Left atrium: The atrium was moderately dilated. - Right ventricle: The cavity size was normal. Systolic function was normal. - Right atrium: The atrium was mildly dilated. - Pulmonary arteries: PA peak pressure: 28 mm Hg (S). - Systemic veins: IVC measured 2.2 cm with > 50% respirophasic variation, suggesting RA pressure 8 mmHg.  Impressions:  - Normal LV size with mild LV hypertrophy. EF 35-40%. Diffuse hypokinesis, worst in the inferior wall. Normal RV size and systolic function. Mild MR, mild AI. Biatrial enlargement.   BP Readings from Last 3 Encounters:  09/24/14 122/82   Wt Readings from Last 3 Encounters:  09/24/14 252 lb (114.306 kg)  04/28/12 250 lb (113.399 kg)   He has a BMI of Body mass index is 34.09 kg/(m^2)..  CBC Latest Ref Rng 09/14/2014  WBC 4.0 - 10.5 K/uL 7.1  Hemoglobin 13.0 - 17.0 g/dL 40.9  Hematocrit 81.1 - 52.0 % 45.2  Platelets 150 - 400 K/uL 224   BMP Latest Ref Rng 09/14/2014  Glucose 70 - 99 mg/dL 914(N)  BUN 6 - 20 mg/dL 13  Creatinine 8.29 - 5.62 mg/dL 1.30  Sodium 865 - 784 mmol/L 139  Potassium 3.5 - 5.1 mmol/L 4.3  Chloride 101 - 111 mmol/L 104  CO2 22 - 32 mmol/L 25  Calcium 8.9 - 10.3 mg/dL 9.7    Lipid Panel  No results found for: CHOL, TRIG, HDL, CHOLHDL, VLDL, LDLCALC, LDLDIRECT   BNP (last 3 results) No results for input(s): BNP in the last 8760 hours.  [facility].  Assessment and  Plan:  1. Atrial fibrillation The patient has Asymptomatic atrial flutter/?fib.   He  is  appropriately anticoagulated at this time. The patient is adequately rate controlled with metoprolol. Antiarrhythmic therapy to dates has included none  A long discussion with the patient was had today regarding therapeutic strategies.  Extensive discussion of lifestyle modification including begin progressive daily aerobic exercise program, follow a low fat, low cholesterol diet, attempt to lose weight, reduce salt in diet and cooking and continue current medications was also discussed.  Presently, our recommendations include :  1. New onset typical a. Flutter(? Mixed fib), asymptomatic/ ERAF s/p DCCV Continue eliquis 5 mg bid.  Continue metoprolol.  2. Morbid obesity As above, lifestyle modification was discussed at length including regular exercise and weight reduction. Info re weight class given to pt.  3. Obstructive sleep apnea The importance of adequate treatment  of sleep apnea was discussed today in order to improve our ability to maintain sinus rhythm long term.Pt has restarted cpap  4. HTN Avoid salt. Weight loss  F/u with Dr. Johney Frame to discuss ablation vrs AAD. With reduced EF, I think it would be beneficial to pt to restore SR going forward Not a candidate for 1c agents due to CAD/LVD Probably sotalol or Tikosyn best choice for him  Will need a general cardiologist as well Pt requests Dr. Mayford Knife and will arrange for f/u.  Rudi Coco NP  Nurse Practitioner, [facility] 09/30/2014 4:29 PM

## 2014-09-30 NOTE — Patient Instructions (Signed)
Scheduler will be in touch with you regarding appointments with Dr. Johney FrameAllred and Dr. Mayford Knifeurner.  [phone] [address]

## 2014-10-08 ENCOUNTER — Ambulatory Visit (INDEPENDENT_AMBULATORY_CARE_PROVIDER_SITE_OTHER): Payer: PRIVATE HEALTH INSURANCE | Admitting: Internal Medicine

## 2014-10-08 ENCOUNTER — Encounter: Payer: Self-pay | Admitting: Internal Medicine

## 2014-10-08 VITALS — BP 134/80 | HR 75 | Ht 72.0 in | Wt 251.2 lb

## 2014-10-08 DIAGNOSIS — I5022 Chronic systolic (congestive) heart failure: Secondary | ICD-10-CM | POA: Diagnosis not present

## 2014-10-08 DIAGNOSIS — I481 Persistent atrial fibrillation: Secondary | ICD-10-CM | POA: Diagnosis not present

## 2014-10-08 DIAGNOSIS — I4819 Other persistent atrial fibrillation: Secondary | ICD-10-CM

## 2014-10-08 DIAGNOSIS — I4892 Unspecified atrial flutter: Secondary | ICD-10-CM

## 2014-10-08 MED ORDER — SOTALOL HCL 80 MG PO TABS: 80.0000 mg | ORAL_TABLET | Freq: Two times a day (BID) | ORAL | Status: DC

## 2014-10-08 NOTE — Patient Instructions (Signed)
Medication Instructions: - decrease metoprolol to 50 mg 1/2 tablet (25 mg) by mouth twice daily - start sotalol 80 mg one tablet by mouth twice daily  Labwork: - none  Procedures/Testing: - EKG on Monday 10/11/14 at 4:00 pm at the A-fib clinic Rudi Coco(Donna Carroll, NP)  Follow-Up: - You will be set up on Monday to see  Lupita LeashDonna in 2 weeks  Any Additional Special Instructions Will Be Listed Below (If Applicable).

## 2014-10-08 NOTE — Progress Notes (Deleted)
Primary Care Physician: Gwynneth Aliment, MD Referring Physician: Rudi Coco, NP   Colin [name] is a 59 y.o. male with a h/o  new onset atrial flutter found early May at time of regular physical exam, asymptomatic. HR per pt initially in the 120's He is unsure as to how long this has been present, but did see his PCP 3 months ago and arrythmia was not present at that time. The patient is asymptomatic. He does have sleep apnea but has not been using CPAP for sometime. Positive sleep study in 2013. Additional obesity and no exercise program. No tobacco or alcohol use. No OTC stimulents, no high caffeine use. Remote history of stent for CAD in 2002?  Echo obtained 5/12 and was found to have lv dysfunction at 35-40%. Unsure as a consequence of old MI vrs tachycardia mediated cardiomyopathy. Old Echo could not be found at [facility] office. Prior pt of Dr. Alanda Amass. He did have a successful DCCV 5/16 but when he returned for f/u he was in afib/flutter.   He is being evaluated today for other options to obtain SR. He is in rhythm today. Dr. Johney Frame would like to try AAD first before considering ablation. Sotalol is thought to be a good option with pt to start today.   Today, he denies symptoms of palpitations, chest pain, shortness of breath, orthopnea, PND, lower extremity edema, dizziness, presyncope, syncope, or neurologic sequela. The patient is tolerating medications without difficulties and is otherwise without complaint today.   No past medical history on file. Past Surgical History  Procedure Laterality Date  . Tee without cardioversion N/A 09/24/2014    Procedure: TRANSESOPHAGEAL ECHOCARDIOGRAM (TEE);  Surgeon: Quintella Reichert, MD;  Location: [facility] ENDOSCOPY;  Service: Cardiovascular;  Laterality: N/A;  . Cardioversion N/A 09/24/2014    Procedure: CARDIOVERSION;  Surgeon: Quintella Reichert, MD;  Location: [facility] ENDOSCOPY;  Service: Cardiovascular;  Laterality: N/A;    Current Outpatient  Prescriptions  Medication Sig Dispense Refill  . apixaban (ELIQUIS) 5 MG TABS tablet Take 1 tablet (5 mg total) by mouth 2 (two) times daily. 60 tablet 3  . atorvastatin (LIPITOR) 40 MG tablet Take 40 mg by mouth daily.    . Canagliflozin-Metformin HCl 50-1000 MG TABS Take 1 tablet by mouth 2 (two) times daily.    . Cholecalciferol (VITAMIN D) 2000 UNITS tablet Take 2,000 Units by mouth daily.     Marland Kitchen levothyroxine (SYNTHROID, LEVOTHROID) 150 MCG tablet Take 150 mcg by mouth daily before breakfast.    . metoprolol (LOPRESSOR) 50 MG tablet Take 1/2 tablet (25 mg) by mouth twice daily    . Multiple Vitamins-Minerals (CENTRUM SILVER ADULT 50+ PO) Take 1 tablet by mouth daily.     . pioglitazone (ACTOS) 30 MG tablet Take 30 mg by mouth daily.    . sotalol (BETAPACE) 80 MG tablet Take 1 tablet (80 mg total) by mouth 2 (two) times daily. 60 tablet 6   No current facility-administered medications for this visit.    No Known Allergies  History   Social History  . Marital Status: Married    Spouse Name: N/A  . Number of Children: N/A  . Years of Education: N/A   Occupational History  . Not on file.   Social History Main Topics  . Smoking status: Former Games developer  . Smokeless tobacco: Not on file  . Alcohol Use: Not on file  . Drug Use: Not on file  . Sexual Activity: Not on file   Other Topics Concern  .  Not on file   Social History Narrative    No family history on file.  ROS- All systems are reviewed and negative except as per the HPI above  Physical Exam: Filed Vitals:   10/08/14 1626  BP: 134/80  Pulse: 75  Height: 6' (1.829 m)  Weight: 251 lb 3.2 oz (113.944 kg)    GEN- The patient is well appearing, alert and oriented x 3 today.   Head- normocephalic, atraumatic Eyes-  Sclera clear, conjunctiva pink Ears- hearing intact Oropharynx- clear Neck- supple, no JVP Lymph- no cervical lymphadenopathy Lungs- Clear to ausculation bilaterally, normal work of  breathing Heart- Regular rate and rhythm, no murmurs, rubs or gallops, PMI not laterally displaced GI- soft, NT, ND, + BS Extremities- no clubbing, cyanosis, or edema MS- no significant deformity or atrophy Skin- no rash or lesion Psych- euthymic mood, full affect Neuro- strength and sensation are intact  EKG-SR with rare PVC, LAD, NS t wave abnormality. Pr int 160 ms, QRS 82 bpm, QTc 437 ms.  Assessment and Plan:  1. Afib/flutter In sinus rhythm today Start sotalol 80 mg bid  Cut metoprolol to 25 mg bid EKG on Monday in afib clinic F/u Dr. Mayford Knifeurner on 6/17 Echo when in SR to further assess EF

## 2014-10-10 ENCOUNTER — Encounter: Payer: Self-pay | Admitting: Internal Medicine

## 2014-10-10 DIAGNOSIS — I5022 Chronic systolic (congestive) heart failure: Secondary | ICD-10-CM | POA: Insufficient documentation

## 2014-10-10 DIAGNOSIS — I4819 Other persistent atrial fibrillation: Secondary | ICD-10-CM | POA: Insufficient documentation

## 2014-10-10 NOTE — Progress Notes (Signed)
Primary Care Physician: Gwynneth AlimentSANDERS,ROBYN N, MD Referring Physician: Rudi Cocoonna Carroll, NP  Sandi CarneBruce E [name] is a 59 y.o. male with a h/o  new onset atrial flutter found early May at time of regular physical exam, asymptomatic. HR per pt initially in the 120's He is unsure as to how long this has been present, but did see his PCP 3 months ago and arrythmia was not present at that time. The patient is asymptomatic. He does have sleep apnea but has not been using CPAP for sometime. Positive sleep study in 2013. Additional obesity and no exercise program. No tobacco or alcohol use. No OTC stimulents, no high caffeine use. Remote history of stent for CAD in 2002?  Echo obtained 5/12 and was found to have lv dysfunction at 35-40%. Unsure as a consequence of old MI vrs tachycardia mediated cardiomyopathy. [facility] Prior pt of Dr. Alanda AmassWeintraub. He did have a successful DCCV 5/16 but when he returned for f/u he was in afib/flutter.   He is being evaluated today for other options to obtain SR. He is in rhythm today. Dr. Johney FrameAllred would like to try AAD first before considering ablation. Sotalol is thought to be a good option with pt to start today.   Today, he denies symptoms of palpitations, chest pain, shortness of breath, orthopnea, PND, lower extremity edema, dizziness, presyncope, syncope, or neurologic sequela. The patient is tolerating medications without difficulties and is otherwise without complaint today.   Past Medical History  Diagnosis Date  . Persistent atrial fibrillation   . Atrial flutter   . Obesities, morbid   . Hypertension   . Sleep apnea     Past Surgical History  Procedure Laterality Date  . Tee without cardioversion N/A 09/24/2014    Procedure: TRANSESOPHAGEAL ECHOCARDIOGRAM (TEE);  Surgeon: Quintella Reichertraci R Turner, MD;  [facility];  Service: Cardiovascular;  Laterality: N/A;  . Cardioversion N/A 09/24/2014    Procedure: CARDIOVERSION;  Surgeon:  Quintella Reichertraci R Turner, MD;  Location: [facility] ENDOSCOPY;  Service: Cardiovascular;  Laterality: N/A;    Current Outpatient Prescriptions  Medication Sig Dispense Refill  . apixaban (ELIQUIS) 5 MG TABS tablet Take 1 tablet (5 mg total) by mouth 2 (two) times daily. 60 tablet 3  . atorvastatin (LIPITOR) 40 MG tablet Take 40 mg by mouth daily.    . Canagliflozin-Metformin HCl 50-1000 MG TABS Take 1 tablet by mouth 2 (two) times daily.    . Cholecalciferol (VITAMIN D) 2000 UNITS tablet Take 2,000 Units by mouth daily.     Marland Kitchen. levothyroxine (SYNTHROID, LEVOTHROID) 150 MCG tablet Take 150 mcg by mouth daily before breakfast.    . metoprolol (LOPRESSOR) 50 MG tablet Take 1/2 tablet (25 mg) by mouth twice daily    . Multiple Vitamins-Minerals (CENTRUM SILVER ADULT 50+ PO) Take 1 tablet by mouth daily.     . pioglitazone (ACTOS) 30 MG tablet Take 30 mg by mouth daily.    . sotalol (BETAPACE) 80 MG tablet Take 1 tablet (80 mg total) by mouth 2 (two) times daily. 60 tablet 6   No current facility-administered medications for this visit.    No Known Allergies  History   Social History  . Marital Status: Married    Spouse Name: N/A  . Number of Children: N/A  . Years of Education: N/A   Occupational History  . Not on file.   Social History Main Topics  . Smoking status: Former Games developermoker  . Smokeless tobacco: Not on  file  . Alcohol Use: Not on file  . Drug Use: Not on file  . Sexual Activity: Not on file   Other Topics Concern  . Not on file   Social History Narrative   FH- HTN  ROS- All systems are reviewed and negative except as per the HPI above  Physical Exam: Filed Vitals:   10/08/14 1626  BP: 134/80  Pulse: 75  Height: 6' (1.829 m)  Weight: 251 lb 3.2 oz (113.944 kg)    GEN- The patient is overweight appearing, alert and oriented x 3 today.   Head- normocephalic, atraumatic Eyes-  Sclera clear, conjunctiva pink Ears- hearing intact Oropharynx- clear Neck- supple,   Lungs- Clear  to ausculation bilaterally, normal work of breathing Heart- Regular rate and rhythm, no murmurs, rubs or gallops, PMI not laterally displaced GI- soft, NT, ND, + BS Extremities- no clubbing, cyanosis, or edema MS- no significant deformity or atrophy Skin- no rash or lesion Psych- euthymic mood, full affect Neuro- strength and sensation are intact  EKG-SR with stable QT  Assessment and Plan:  1. Afib/flutter In sinus rhythm today Therapeutic strategies for afib/ atrial flutter including medicine and ablation were discussed in detail with the patient today.  At this time, I would advise AAD therapy.  I think that sotalol may be his best option. Start sotalol 80 mg bid   Reduce metoprolol to 25 mg bid EKG on Monday in afib clinic  2. Obesity Lifestyle modification is encouraged  3. OSA F/u Dr. Mayford Knife on 6/17 for further assessment  4. Chronic systolic dysfunction Possibly worsened by AF Consider repeat echo after optimization of AF management and after appropriate medical therapy for reduced EF  Follow up with Dr Mayford Knife and also the AF clinic I will see as needed going forward

## 2014-10-11 ENCOUNTER — Ambulatory Visit (HOSPITAL_COMMUNITY): Payer: PRIVATE HEALTH INSURANCE | Admitting: Nurse Practitioner

## 2014-10-12 ENCOUNTER — Ambulatory Visit (HOSPITAL_COMMUNITY)
Admission: RE | Admit: 2014-10-12 | Discharge: 2014-10-12 | Disposition: A | Discharge status: Home / Self Care | Payer: PRIVATE HEALTH INSURANCE | Source: Ambulatory Visit | Attending: Nurse Practitioner | Admitting: Nurse Practitioner

## 2014-10-12 DIAGNOSIS — I481 Persistent atrial fibrillation: Secondary | ICD-10-CM | POA: Diagnosis present

## 2014-10-12 DIAGNOSIS — I480 Paroxysmal atrial fibrillation: Secondary | ICD-10-CM

## 2014-10-12 DIAGNOSIS — I4819 Other persistent atrial fibrillation: Secondary | ICD-10-CM

## 2014-10-12 NOTE — Progress Notes (Signed)
Patient in for ekg only.  Patient reports no problems since starting sotalol on Friday 6/3. Rudi Cocoonna Carroll, NP to review EKG

## 2014-10-12 NOTE — Progress Notes (Signed)
Patient ID: Sandi CarneBruce E [name], male   DOB: 03/26/1956, 59 y.o.   MRN: [id number] Pt in for f/u starting sotalol 80 mg bid on Friday. He is tolerating and maintaining SR with HR 64 bpm, Pr int 168 ms, QRS 98 ms, QTc 470 ms. He wants to have a tooth pulled but will have to stay on DOAC for 30 days post cardioversion. He is scheduled to get established with Dr. Mayford Knifeurner 6/17 and can have sotalol/rhythm further evaluated then.

## 2014-10-20 ENCOUNTER — Encounter: Payer: Self-pay | Admitting: *Deleted

## 2014-10-20 ENCOUNTER — Telehealth: Payer: Self-pay | Admitting: *Deleted

## 2014-10-20 NOTE — Telephone Encounter (Signed)
unable to reach pt, need fm hx and status... 

## 2014-10-22 ENCOUNTER — Ambulatory Visit (INDEPENDENT_AMBULATORY_CARE_PROVIDER_SITE_OTHER): Payer: PRIVATE HEALTH INSURANCE | Admitting: Cardiology

## 2014-10-22 ENCOUNTER — Encounter: Payer: Self-pay | Admitting: Cardiology

## 2014-10-22 VITALS — BP 124/80 | HR 68 | Ht 72.0 in | Wt 249.4 lb

## 2014-10-22 DIAGNOSIS — G4733 Obstructive sleep apnea (adult) (pediatric): Secondary | ICD-10-CM

## 2014-10-22 DIAGNOSIS — I5022 Chronic systolic (congestive) heart failure: Secondary | ICD-10-CM | POA: Diagnosis not present

## 2014-10-22 DIAGNOSIS — I480 Paroxysmal atrial fibrillation: Secondary | ICD-10-CM | POA: Diagnosis not present

## 2014-10-22 DIAGNOSIS — I481 Persistent atrial fibrillation: Secondary | ICD-10-CM | POA: Diagnosis not present

## 2014-10-22 MED ORDER — METOPROLOL TARTRATE 25 MG PO TABS: 25.0000 mg | ORAL_TABLET | Freq: Two times a day (BID) | ORAL | Status: DC

## 2014-10-22 NOTE — Progress Notes (Signed)
-    Cardiology Office Note   Date:  10/23/2014   ID:  Colin [name], DOB Sep 04, 1955, MRN [id number]  PCP:  Gwynneth Aliment, MD    Chief Complaint  Patient presents with  . Atrial Fibrillation  . Sleep Apnea      History of Present Illness: Colin [name] is a 59 y.o. male who presents for evaluation of sleep apnea.  The patient has a history of atrial fibrillation and flutter and is s/p ablation.  He has chronic systolic CHF felt secondary to AF.  He has a history of OSA but has not used CPAP in some time.  He has now started back using the CPAP device a few months ago.  His last sleep study was about 3 years ago.  He is now referred for further evaluation of sleep apnea and to establish cardiac care for his afib. He uses a full face mask which is working well for him but he wakes up with mouth dryness.  His machine is > 5 years and he says he thinks the humidifier is not working well anymore.  His wife says that he snores even with the mask.  He does not feel rested for the most part when he gets up in the am.  He has problems with napping during the day.  He does not get any aerobic exercise.  He recently saw Dr. Johney Frame for PAF and underwent TEE/DCCV.  He is now on Sotolol and maintaining NSR with no breakthrough of palpitations. During his workup for afib he was found to have a DCM with EF 35-40% and was felt to possibly be tachycardia mediated from afib.  He denies any chest pain, SOB, DOE, LE edema, dizziness, palpitations or syncope.      Past Medical History  Diagnosis Date  . Atrial flutter   . Obesities, morbid   . Hypertension   . Chronic systolic dysfunction of left ventricle   . PAF (paroxysmal atrial fibrillation)     s/p TEE/DCCV now on Sotolol  . OSA (obstructive sleep apnea)     Past Surgical History  Procedure Laterality Date  . Tee without cardioversion N/A 09/24/2014    Procedure: TRANSESOPHAGEAL ECHOCARDIOGRAM (TEE);  Surgeon: Quintella Reichert, MD;   Location: [facility] ENDOSCOPY;  Service: Cardiovascular;  Laterality: N/A;  . Cardioversion N/A 09/24/2014    Procedure: CARDIOVERSION;  Surgeon: Quintella Reichert, MD;  Location: [facility] ENDOSCOPY;  Service: Cardiovascular;  Laterality: N/A;     Current Outpatient Prescriptions  Medication Sig Dispense Refill  . apixaban (ELIQUIS) 5 MG TABS tablet Take 1 tablet (5 mg total) by mouth 2 (two) times daily. 60 tablet 3  . atorvastatin (LIPITOR) 40 MG tablet Take 40 mg by mouth daily.    . Canagliflozin-Metformin HCl 50-1000 MG TABS Take 1 tablet by mouth 2 (two) times daily.    . Cholecalciferol (VITAMIN D) 2000 UNITS tablet Take 2,000 Units by mouth daily.     Marland Kitchen levothyroxine (SYNTHROID, LEVOTHROID) 150 MCG tablet Take 150 mcg by mouth daily before breakfast.    . metoprolol tartrate (LOPRESSOR) 25 MG tablet Take 1 tablet (25 mg total) by mouth 2 (two) times daily. 60 tablet 6  . Multiple Vitamins-Minerals (CENTRUM SILVER ADULT 50+ PO) Take 1 tablet by mouth daily.     . pioglitazone (ACTOS) 30 MG tablet Take 30 mg by mouth daily.    . sotalol (BETAPACE) 80 MG  tablet Take 1 tablet (80 mg total) by mouth 2 (two) times daily. 60 tablet 6   No current facility-administered medications for this visit.    Allergies:   Review of patient's allergies indicates no known allergies.    Social History:  The patient  reports that he has quit smoking. He does not have any smokeless tobacco history on file. He reports that he does not drink alcohol or use illicit drugs.   Family History:  The patient's family history includes Heart Problems in his father and paternal grandfather; Stroke in his mother.    ROS:  Please see the history of present illness.   Otherwise, review of systems are positive for none.   All other systems are reviewed and negative.    PHYSICAL EXAM: VS:  BP 124/80 mmHg  Pulse 68  Ht 6' (1.829 m)  Wt 249 lb 6.4 oz (113.127 kg)  BMI 33.82 kg/m2  SpO2 97% , BMI Body mass index is 33.82  kg/(m^2). GEN: Well nourished, well developed, in no acute distress HEENT: normal Neck: no JVD, carotid bruits, or masses Cardiac: RRR; no murmurs, rubs, or gallops,no edema  Respiratory:  clear to auscultation bilaterally, normal work of breathing GI: soft, nontender, nondistended, + BS MS: no deformity or atrophy Skin: warm and dry, no rash Neuro:  Strength and sensation are intact Psych: euthymic mood, full affect   EKG:  EKG was ordered today and showed NSR with LAD, LVH by voltage QTc 429 msec    Recent Labs: 09/14/2014: BUN 13; Creatinine, Ser 1.14; Hemoglobin 15.3; Platelets 224; Potassium 4.3; Sodium 139    Lipid Panel No results found for: CHOL, TRIG, HDL, CHOLHDL, VLDL, LDLCALC, LDLDIRECT    Wt Readings from Last 3 Encounters:  10/22/14 249 lb 6.4 oz (113.127 kg)  10/08/14 251 lb 3.2 oz (113.944 kg)  09/30/14 251 lb 6.4 oz (114.034 kg)        ASSESSMENT AND PLAN:  1.  Obstructive sleep apnea - his CPAP is > 89 years old.  I would like to repeat a split night PSG and order a new machine for him.  He is having daytime sleepiness so need to repeat titration to find appropriate pressure.  He will continue on current CPAP settings until we get his new sleep study.   2.  Obesity - I have encouraged him to try to get into a routine exercise program 3.  PAF s/p DCCV and maintaining NSR.  QTc stable after starting Sotolol.  Continue metoprolol/sotolol/Eliquis   4.  Chronic systolic CHF most likely secondary to afib.  Appears euvolemic on exam.  EF 35-40%. Continue BB.   Will recheck 2D echo to reassess LVF in August.   Current medicines are reviewed at length with the patient today.  The patient does not have concerns regarding medicines.  The following changes have been made:  no change  Labs/ tests ordered today: See above Assessment and Plan  Orders Placed This Encounter  Procedures  . EKG 12-Lead  . Echocardiogram  . Split night study     Disposition:   FU  with me in 6 months  Signed, Quintella Reichert, MD  10/23/2014 6:46 PM    [facility] [address] [place], [place], [place]  [address] Phone: [phone]; Fax: [phone]

## 2014-10-22 NOTE — Patient Instructions (Addendum)
Medication Instructions:  Your physician recommends that you continue on your current medications as directed. Please refer to the Current Medication list given to you today.   Labwork: None  Testing/Procedures: Your physician has requested that you have an echocardiogram in August, 2016. Echocardiography is a painless test that uses sound waves to create images of your heart. It provides your doctor with information about the size and shape of your heart and how well your heart's chambers and valves are working. This procedure takes approximately one hour. There are no restrictions for this procedure.   Your physician has recommended that you have a sleep study. This test records several body functions during sleep, including: brain activity, eye movement, oxygen and carbon dioxide blood levels, heart rate and rhythm, breathing rate and rhythm, the flow of air through your mouth and nose, snoring, body muscle movements, and chest and belly movement.  Follow-Up: Your physician wants you to follow-up in: 6 months with Dr. Mayford Knife. You will receive a reminder letter in the mail two months in advance. If you don't receive a letter, please call our office to schedule the follow-up appointment.   Any Other Special Instructions Will Be Listed Below (If Applicable).

## 2014-10-23 ENCOUNTER — Encounter: Payer: Self-pay | Admitting: Cardiology

## 2014-10-23 DIAGNOSIS — Z9989 Dependence on other enabling machines and devices: Secondary | ICD-10-CM

## 2014-10-23 DIAGNOSIS — G4733 Obstructive sleep apnea (adult) (pediatric): Secondary | ICD-10-CM | POA: Insufficient documentation

## 2014-12-08 ENCOUNTER — Other Ambulatory Visit (HOSPITAL_COMMUNITY): Payer: PRIVATE HEALTH INSURANCE

## 2014-12-20 ENCOUNTER — Ambulatory Visit (HOSPITAL_COMMUNITY): Payer: PRIVATE HEALTH INSURANCE | Attending: Cardiology

## 2014-12-20 ENCOUNTER — Other Ambulatory Visit: Payer: Self-pay

## 2014-12-20 ENCOUNTER — Other Ambulatory Visit: Payer: Self-pay | Admitting: Cardiology

## 2014-12-20 DIAGNOSIS — I77810 Thoracic aortic ectasia: Secondary | ICD-10-CM | POA: Diagnosis not present

## 2014-12-20 DIAGNOSIS — I10 Essential (primary) hypertension: Secondary | ICD-10-CM | POA: Diagnosis not present

## 2014-12-20 DIAGNOSIS — I480 Paroxysmal atrial fibrillation: Secondary | ICD-10-CM

## 2014-12-20 DIAGNOSIS — E669 Obesity, unspecified: Secondary | ICD-10-CM | POA: Insufficient documentation

## 2014-12-20 DIAGNOSIS — I5022 Chronic systolic (congestive) heart failure: Secondary | ICD-10-CM | POA: Diagnosis not present

## 2014-12-20 DIAGNOSIS — Z6833 Body mass index (BMI) 33.0-33.9, adult: Secondary | ICD-10-CM | POA: Diagnosis not present

## 2014-12-23 ENCOUNTER — Telehealth: Payer: Self-pay | Admitting: *Deleted

## 2014-12-23 DIAGNOSIS — I480 Paroxysmal atrial fibrillation: Secondary | ICD-10-CM

## 2014-12-23 DIAGNOSIS — I5022 Chronic systolic (congestive) heart failure: Secondary | ICD-10-CM

## 2014-12-23 DIAGNOSIS — I4892 Unspecified atrial flutter: Secondary | ICD-10-CM

## 2014-12-23 NOTE — Telephone Encounter (Signed)
I rtnd ptcb and he has been notified of echo results and findings of mildly dilated aortic root. Pt agreeable to plan of care to repeat echo in 15yr. Pt verbalized understanding to plan of care.

## 2015-01-07 ENCOUNTER — Ambulatory Visit (HOSPITAL_BASED_OUTPATIENT_CLINIC_OR_DEPARTMENT_OTHER): Payer: PRIVATE HEALTH INSURANCE | Attending: Cardiology

## 2015-01-07 DIAGNOSIS — G4733 Obstructive sleep apnea (adult) (pediatric): Secondary | ICD-10-CM | POA: Insufficient documentation

## 2015-01-07 DIAGNOSIS — R0683 Snoring: Secondary | ICD-10-CM | POA: Diagnosis not present

## 2015-01-08 NOTE — Progress Notes (Signed)
Patient Name: Colin [name], Colin [name] Date: 01/07/2015 MRN: [id number] Gender: Male D.O.B: 1956-04-10 Age (years): 77 Referring Provider: Fransico Him MD, ABSM Interpreting Physician: Fransico Him MD, ABSM RPSGT: Madelon Lips  Weight (lbs): 250 Height (inches): 72 BMI: 34 Neck Size: 21.00   CLINICAL INFORMATION Sleep Study Type: Split Night CPAP  Indication for sleep study: OSA  Epworth Sleepiness Score: 3  SLEEP STUDY TECHNIQUE As per the AASM Manual for the Scoring of Sleep and Associated Events v2.3 (April 2016) with a hypopnea requiring 4% desaturations.  The channels recorded and monitored were frontal, central and occipital EEG, electrooculogram (EOG), submentalis EMG (chin), nasal and oral airflow, thoracic and abdominal wall motion, anterior tibialis EMG, snore microphone, electrocardiogram, and pulse oximetry. Continuous positive airway pressure (CPAP) was initiated when the patient met split night criteria and was titrated according to treat sleep-disordered breathing.  MEDICATIONS Medications taken by the patient : Apixaban, Lipitor, Lopressor, Synthroid, Actos, Betapace, Canagliflozin/metformin Medications administered by patient during sleep study : No sleep medicine administered.  RESPIRATORY PARAMETERS  Diagnostic Total AHI (/hr): 15.1  RDI (/hr):15.1   OA Index (/hr): 1.4  CA Index (/hr): 0.0 REM AHI (/hr): 42.1  NREM AHI (/hr):7.3  Supine AHI (/hr):N/A  Non-supine AHI (/hr): 15.10 Min O2 Sat (%):85.00  Mean O2 (%): 94.50  Time below 88% (min):0.7    Titration Optimal Pressure (cm):9 AHI at Optimal Pressure (/hr):0.0 Min O2 at Optimal Pressure (%):94.0 Supine % at Optimal (%):0 Sleep % at Optimal (%):97    SLEEP ARCHITECTURE The recording time for the entire night was 362.2 minutes.  During a baseline period of 132.8 minutes, the patient slept for 127.1 minutes in REM and nonREM, yielding a sleep efficiency of 95.7%. Sleep onset after lights out was  2.2 minutes with a REM latency of 56.0 minutes. The patient spent 4.33% of the night in stage N1 sleep, 71.29% in stage N2 sleep, 1.97% in stage N3 and 22.41% in REM.  During the titration period of 223.7 minutes, the patient slept for 219.5 minutes in REM and nonREM, yielding a sleep efficiency of 98.1%. Sleep onset after CPAP initiation was 0.2 minutes with a REM latency of 38.5 minutes. The patient spent 0.91% of the night in stage N1 sleep, 59.68% in stage N2 sleep, 0.46% in stage N3 and 38.95% in REM.  CARDIAC DATA The 2 lead EKG demonstrated sinus rhythm. The mean heart rate was 52.59 beats per minute. Other EKG findings include: None.  LEG MOVEMENT DATA The total Periodic Limb Movements of Sleep (PLMS) were 0. The PLMS index was 0.00 .  IMPRESSIONS Moderate obstructive sleep apnea occurred during the diagnostic portion of the study(AHI = 15.1/hour). An optimal PAP pressure was selected for this patient ( 9 cm of water) No significant central sleep apnea occurred during the diagnostic portion of the study (CAI = 0.0/hour). The patient had minimal or no oxygen desaturation during the diagnostic portion of the study (Min O2 = 85.00%) The patient snored with Moderate snoring volume during the diagnostic portion of the study. No cardiac abnormalities were noted during this study. Clinically significant periodic limb movements did not occur during sleep.  DIAGNOSIS Obstructive Sleep Apnea (327.23 [G47.33 ICD-10])  RECOMMENDATIONS Trial of CPAP therapy on 9 cm H2O with a Medium size Resmed Full Face Mask Quattro FX mask and heated humidification. Avoid alcohol, sedatives and other CNS depressants that may worsen sleep apnea and disrupt normal sleep architecture. Sleep hygiene should be reviewed to assess factors that may  improve sleep quality. Weight management and regular exercise should be initiated or continued. Return to Sleep Provider for re-evaluation after 10 weeks of  therapy  [place], American Board of Sleep Medicine  ELECTRONICALLY SIGNED ON:  01/09/2015, 2:34 PM [place] PH: [phone]   FX: [phone] [place]

## 2015-01-09 ENCOUNTER — Telehealth: Payer: Self-pay | Admitting: Cardiology

## 2015-01-09 NOTE — Telephone Encounter (Signed)
Please let patient know that they have significant sleep apnea and had successful CPAP titration and will be set up with CPAP unit.  Please let DME know that order is in EPIC.  Please set patient up for OV in 10 weeks 

## 2015-01-09 NOTE — Addendum Note (Signed)
Addended by: Armanda Magic R on: 01/09/2015 02:41 PM   Modules accepted: Orders

## 2015-01-11 NOTE — Telephone Encounter (Signed)
Attempted to call. Mailbox is full and cannot accept messages

## 2015-01-13 NOTE — Telephone Encounter (Signed)
Attempted to call again. Cell mailbox is full and cannot accept messages. Left a message on home phone asking that he call me.

## 2015-01-13 NOTE — Telephone Encounter (Signed)
Patient is aware of results. Stated verbal understanding.   [facility] Notified.  Will schedule 10 week follow-up when he is set up with machine.

## 2015-02-22 ENCOUNTER — Encounter: Payer: Self-pay | Admitting: Cardiology

## 2015-04-06 ENCOUNTER — Other Ambulatory Visit (HOSPITAL_COMMUNITY): Payer: Self-pay | Admitting: Nurse Practitioner

## 2015-04-15 ENCOUNTER — Other Ambulatory Visit: Payer: Self-pay

## 2015-04-15 ENCOUNTER — Encounter: Payer: Self-pay | Admitting: Cardiology

## 2015-04-15 ENCOUNTER — Ambulatory Visit (INDEPENDENT_AMBULATORY_CARE_PROVIDER_SITE_OTHER): Payer: PRIVATE HEALTH INSURANCE | Admitting: Cardiology

## 2015-04-15 VITALS — BP 120/84 | HR 92 | Ht 72.0 in | Wt 251.8 lb

## 2015-04-15 DIAGNOSIS — G4733 Obstructive sleep apnea (adult) (pediatric): Secondary | ICD-10-CM | POA: Diagnosis not present

## 2015-04-15 DIAGNOSIS — I77810 Thoracic aortic ectasia: Secondary | ICD-10-CM

## 2015-04-15 DIAGNOSIS — I480 Paroxysmal atrial fibrillation: Secondary | ICD-10-CM | POA: Diagnosis not present

## 2015-04-15 HISTORY — DX: Thoracic aortic ectasia: I77.810

## 2015-04-15 LAB — CBC WITH DIFFERENTIAL/PLATELET
BASOS ABS: 0 10*3/uL (ref 0.0–0.1)
BASOS PCT: 0 % (ref 0–1)
EOS ABS: 0.2 10*3/uL (ref 0.0–0.7)
EOS PCT: 2 % (ref 0–5)
HCT: 47.4 % (ref 39.0–52.0)
Hemoglobin: 16.3 g/dL (ref 13.0–17.0)
Lymphocytes Relative: 34 % (ref 12–46)
Lymphs Abs: 2.6 10*3/uL (ref 0.7–4.0)
MCH: 28.6 pg (ref 26.0–34.0)
MCHC: 34.4 g/dL (ref 30.0–36.0)
MCV: 83.2 fL (ref 78.0–100.0)
MPV: 10.2 fL (ref 8.6–12.4)
Monocytes Absolute: 0.5 10*3/uL (ref 0.1–1.0)
Monocytes Relative: 7 % (ref 3–12)
NEUTROS PCT: 57 % (ref 43–77)
Neutro Abs: 4.3 10*3/uL (ref 1.7–7.7)
PLATELETS: 224 10*3/uL (ref 150–400)
RBC: 5.7 MIL/uL (ref 4.22–5.81)
RDW: 14.7 % (ref 11.5–15.5)
WBC: 7.5 10*3/uL (ref 4.0–10.5)

## 2015-04-15 LAB — BASIC METABOLIC PANEL
BUN: 22 mg/dL (ref 7–25)
CALCIUM: 9.9 mg/dL (ref 8.6–10.3)
CO2: 25 mmol/L (ref 20–31)
CREATININE: 1.19 mg/dL (ref 0.70–1.33)
Chloride: 103 mmol/L (ref 98–110)
Glucose, Bld: 213 mg/dL — ABNORMAL HIGH (ref 65–99)
Potassium: 4.1 mmol/L (ref 3.5–5.3)
Sodium: 138 mmol/L (ref 135–146)

## 2015-04-15 NOTE — Progress Notes (Signed)
Cardiology Office Note   Date:  04/15/2015   ID:  MEREDITH MELLS, DOB [birth year], MRN [id number]  PCP:  Gwynneth Aliment, MD    Chief Complaint  Patient presents with  . Follow-up    sleep      History of Present Illness: Husein Guedes Ma is a 59 y.o. male who presents for followup of OSA.  When I saw him last he complained of having increased daytime sleepiness and wanted a new machine.  He recently underwent PSG showing moderate OSA with an AHI of 15/hr with oxygen saturations < 85%.  He had moderate snoring as well.  He underwent CPAP titration to 9cm H2O.  He is doing well with his CPAP therapy.  He tolerates the full face Patient has been using and benefiting from CPAP use and will continue to benefit from therapy. mask and feels the pressure is adequate.  Since going on CPAP, he has had less daytime sleepiness and feels rested in the am.  He has no mouth or nasal dryness.  His wife says that he does not snore with the mask on.  He denies any recent palpitations, dizziness, chest pain, SOB, LE edema.     Past Medical History  Diagnosis Date  . Atrial flutter (HCC)   . Obesities, morbid (HCC)   . Hypertension   . Chronic systolic dysfunction of left ventricle   . PAF (paroxysmal atrial fibrillation) (HCC)     s/p TEE/DCCV now on Sotolol  . OSA (obstructive sleep apnea)   . PAF (paroxysmal atrial fibrillation) (HCC) 04/15/2015  . Dilated aortic root (HCC) 04/15/2015    Past Surgical History  Procedure Laterality Date  . Tee without cardioversion N/A 09/24/2014    Procedure: TRANSESOPHAGEAL ECHOCARDIOGRAM (TEE);  Surgeon: Quintella Reichert, MD;  Location: [facility] ENDOSCOPY;  Service: Cardiovascular;  Laterality: N/A;  . Cardioversion N/A 09/24/2014    Procedure: CARDIOVERSION;  Surgeon: Quintella Reichert, MD;  Location: [facility] ENDOSCOPY;  Service: Cardiovascular;  Laterality: N/A;     Current Outpatient Prescriptions  Medication Sig Dispense Refill  . atorvastatin  (LIPITOR) 40 MG tablet Take 40 mg by mouth daily.    . Canagliflozin-Metformin HCl 50-1000 MG TABS Take 1 tablet by mouth 2 (two) times daily.    . Cholecalciferol (VITAMIN D) 2000 UNITS tablet Take 2,000 Units by mouth daily.     Marland Kitchen CIALIS 20 MG tablet Take 1 tablet by mouth daily as needed.  3  . ELIQUIS 5 MG TABS tablet TAKE 1 TABLET BY MOUTH TWICE A DAY 60 tablet 6  . levothyroxine (SYNTHROID, LEVOTHROID) 150 MCG tablet Take 150 mcg by mouth daily before breakfast.    . metoprolol tartrate (LOPRESSOR) 25 MG tablet Take 1 tablet (25 mg total) by mouth 2 (two) times daily. 60 tablet 6  . Multiple Vitamins-Minerals (CENTRUM SILVER ADULT 50+ PO) Take 1 tablet by mouth daily.     . pioglitazone (ACTOS) 30 MG tablet Take 30 mg by mouth daily.    . sotalol (BETAPACE) 80 MG tablet Take 1 tablet (80 mg total) by mouth 2 (two) times daily. 60 tablet 6   No current facility-administered medications for this visit.    Allergies:   Review of patient's allergies indicates no known allergies.    Social History:  The patient  reports that he has quit smoking. He does not have any smokeless tobacco history on file. He  reports that he does not drink alcohol or use illicit drugs.   Family History:  The patient's family history includes Heart Problems in his father and paternal grandfather; Stroke in his mother.    ROS:  Please see the history of present illness.   Otherwise, review of systems are positive for none.   All other systems are reviewed and negative.    PHYSICAL EXAM: VS:  BP 120/84 mmHg  Pulse 92  Ht 6' (1.829 m)  Wt 251 lb 12.8 oz (114.216 kg)  BMI 34.14 kg/m2  SpO2 98% , BMI Body mass index is 34.14 kg/(m^2). GEN: Well nourished, well developed, in no acute distress HEENT: normal Neck: no JVD, carotid bruits, or masses Cardiac: RRR; no murmurs, rubs, or gallops,no edema  Respiratory:  clear to auscultation bilaterally, normal work of breathing GI: soft, nontender, nondistended, +  BS MS: no deformity or atrophy Skin: warm and dry, no rash Neuro:  Strength and sensation are intact Psych: euthymic mood, full affect   EKG:  EKG is not ordered today.    Recent Labs: 09/14/2014: BUN 13; Creatinine, Ser 1.14; Hemoglobin 15.3; Platelets 224; Potassium 4.3; Sodium 139    Lipid Panel No results found for: CHOL, TRIG, HDL, CHOLHDL, VLDL, LDLCALC, LDLDIRECT    Wt Readings from Last 3 Encounters:  04/15/15 251 lb 12.8 oz (114.216 kg)  01/07/15 250 lb (113.399 kg)  10/22/14 249 lb 6.4 oz (113.127 kg)      ASSESSMENT AND PLAN:  1.  Moderate OSA with AHI 15/hr and moderate snoring now on CPAP and tolerating well.  His d/l today showed an AHI of 0.9/hr on 9cm H2O and 87% compliance in using more than 4 hours nightly.   2.  Obesity - encouraged him to exercise more 3.  PAF maintaining NSR on BB/Sotolol/Eliquis.  Check BMET.   4.  Mildly dilated aortic root on echo - repeat echo 12/2015   Current medicines are reviewed at length with the patient today.  The patient does not have concerns regarding medicines.  The following changes have been made:  no change  Labs/ tests ordered today: See above Assessment and Plan No orders of the defined types were placed in this encounter.     Disposition:   FU with me in 1 year  Signed, Quintella ReichertURNER,TRACI R, MD  04/15/2015 3:44 PM    [facility] [address], [place], [place]  [address] Phone: [phone](336) [phone]; Fax: [phone]

## 2015-04-15 NOTE — Patient Instructions (Signed)
Medication Instructions:  Your physician recommends that you continue on your current medications as directed. Please refer to the Current Medication list given to you today.   Labwork: TODAY: BMET, CBC  Testing/Procedures: Your physician has requested that you have an echocardiogram in August, 2017. Echocardiography is a painless test that uses sound waves to create images of your heart. It provides your doctor with information about the size and shape of your heart and how well your heart's chambers and valves are working. This procedure takes approximately one hour. There are no restrictions for this procedure.  Follow-Up: Your physician wants you to follow-up in: 6 months with Dr. Turner. You will receive a reminder letter in the mail two months in advance. If you don't receive a letter, please call our office to schedule the follow-up appointment.   Any Other Special Instructions Will Be Listed Below (If Applicable).     If you need a refill on your cardiac medications before your next appointment, please call your pharmacy.   

## 2015-04-19 ENCOUNTER — Telehealth: Payer: Self-pay | Admitting: Cardiology

## 2015-04-19 ENCOUNTER — Encounter: Payer: Self-pay | Admitting: Cardiology

## 2015-04-19 NOTE — Telephone Encounter (Signed)
-----   Message from Quintella Reichertraci R Turner, MD sent at 04/17/2015  7:57 PM EST ----- Please let patient know that labs were normal.  Continue current medical therapy.

## 2015-04-19 NOTE — Telephone Encounter (Signed)
Informed patient of results and verbal understanding expressed.  

## 2015-04-19 NOTE — Telephone Encounter (Signed)
New message ° ° ° ° °Returning a call to the nurse °

## 2015-04-26 ENCOUNTER — Other Ambulatory Visit: Payer: Self-pay | Admitting: Internal Medicine

## 2015-06-25 ENCOUNTER — Other Ambulatory Visit: Payer: Self-pay | Admitting: Cardiology

## 2015-12-07 ENCOUNTER — Other Ambulatory Visit: Payer: Self-pay | Admitting: Internal Medicine

## 2015-12-16 ENCOUNTER — Other Ambulatory Visit: Payer: Self-pay

## 2015-12-16 ENCOUNTER — Ambulatory Visit (HOSPITAL_COMMUNITY): Payer: PRIVATE HEALTH INSURANCE | Attending: Cardiology

## 2015-12-16 DIAGNOSIS — I5022 Chronic systolic (congestive) heart failure: Secondary | ICD-10-CM | POA: Diagnosis not present

## 2015-12-16 DIAGNOSIS — G4733 Obstructive sleep apnea (adult) (pediatric): Secondary | ICD-10-CM | POA: Insufficient documentation

## 2015-12-16 DIAGNOSIS — I480 Paroxysmal atrial fibrillation: Secondary | ICD-10-CM

## 2015-12-16 DIAGNOSIS — Z87891 Personal history of nicotine dependence: Secondary | ICD-10-CM | POA: Insufficient documentation

## 2015-12-16 DIAGNOSIS — Z8249 Family history of ischemic heart disease and other diseases of the circulatory system: Secondary | ICD-10-CM | POA: Insufficient documentation

## 2015-12-16 DIAGNOSIS — I4892 Unspecified atrial flutter: Secondary | ICD-10-CM

## 2015-12-16 DIAGNOSIS — Z6834 Body mass index (BMI) 34.0-34.9, adult: Secondary | ICD-10-CM | POA: Insufficient documentation

## 2015-12-16 DIAGNOSIS — I77810 Thoracic aortic ectasia: Secondary | ICD-10-CM | POA: Diagnosis not present

## 2015-12-16 DIAGNOSIS — E669 Obesity, unspecified: Secondary | ICD-10-CM | POA: Insufficient documentation

## 2015-12-16 DIAGNOSIS — I351 Nonrheumatic aortic (valve) insufficiency: Secondary | ICD-10-CM | POA: Diagnosis not present

## 2015-12-16 DIAGNOSIS — I110 Hypertensive heart disease with heart failure: Secondary | ICD-10-CM | POA: Insufficient documentation

## 2016-01-13 ENCOUNTER — Telehealth: Payer: Self-pay

## 2016-01-13 DIAGNOSIS — I480 Paroxysmal atrial fibrillation: Secondary | ICD-10-CM

## 2016-01-13 DIAGNOSIS — I77810 Thoracic aortic ectasia: Secondary | ICD-10-CM

## 2016-01-13 DIAGNOSIS — I5022 Chronic systolic (congestive) heart failure: Secondary | ICD-10-CM

## 2016-01-13 NOTE — Telephone Encounter (Signed)
-----   Message from Quintella Reichertraci R Turner, MD sent at 12/16/2015  5:28 PM EDT ----- Echo showed normal LVF with increased stiffness of heart muscle.  Mildly thickened AV leaflets.  Mildly dilated aorta.  Mildly enlarged RA and RV.  Severe LA dilatation.  REpeat echo in 1 year to followup on dilated aortic root

## 2016-01-13 NOTE — Telephone Encounter (Signed)
Informed patient of results and verbal understanding expressed.  Repeat ECHO ordered to be scheduled in 1 year. Patient agrees with treatment plan. 

## 2016-02-03 ENCOUNTER — Other Ambulatory Visit (HOSPITAL_COMMUNITY): Payer: Self-pay | Admitting: Nurse Practitioner

## 2016-06-04 ENCOUNTER — Ambulatory Visit: Payer: PRIVATE HEALTH INSURANCE | Admitting: Neurology

## 2016-06-04 ENCOUNTER — Telehealth: Payer: Self-pay

## 2016-06-04 NOTE — Telephone Encounter (Signed)
Patient did not show to NP Appt.

## 2016-06-06 ENCOUNTER — Encounter: Payer: Self-pay | Admitting: Neurology

## 2016-06-19 ENCOUNTER — Ambulatory Visit: Payer: PRIVATE HEALTH INSURANCE | Admitting: Neurology

## 2016-06-20 ENCOUNTER — Ambulatory Visit (INDEPENDENT_AMBULATORY_CARE_PROVIDER_SITE_OTHER): Payer: PRIVATE HEALTH INSURANCE | Admitting: Neurology

## 2016-06-20 ENCOUNTER — Encounter: Payer: Self-pay | Admitting: Neurology

## 2016-06-20 VITALS — BP 122/80 | HR 62 | Resp 18 | Ht 71.0 in | Wt 229.0 lb

## 2016-06-20 DIAGNOSIS — Z794 Long term (current) use of insulin: Secondary | ICD-10-CM

## 2016-06-20 DIAGNOSIS — I2510 Atherosclerotic heart disease of native coronary artery without angina pectoris: Secondary | ICD-10-CM

## 2016-06-20 DIAGNOSIS — I5022 Chronic systolic (congestive) heart failure: Secondary | ICD-10-CM | POA: Diagnosis not present

## 2016-06-20 DIAGNOSIS — H5340 Unspecified visual field defects: Secondary | ICD-10-CM

## 2016-06-20 DIAGNOSIS — I480 Paroxysmal atrial fibrillation: Secondary | ICD-10-CM | POA: Diagnosis not present

## 2016-06-20 DIAGNOSIS — E119 Type 2 diabetes mellitus without complications: Secondary | ICD-10-CM

## 2016-06-20 DIAGNOSIS — IMO0002 Reserved for concepts with insufficient information to code with codable children: Secondary | ICD-10-CM | POA: Insufficient documentation

## 2016-06-20 NOTE — Progress Notes (Signed)
[facility]  PATIENT: Colin [name] DOB: April 11, 1956  REFERRING DOCTOR OR PCP:  Glendale Chard (PCP),  Dr. Gwynn Burly (ophth) referred (fax [phone]) SOURCE: patient, notes from Dr. Joya San OD, notes from cardiology and sleep, sleep study, echo report      _________________________________   HISTORICAL  CHIEF COMPLAINT:  Chief Complaint  Patient presents with  . Visual Disturbance    Colin [name] is here for eval of loss of peripheral vision left eye only, onset 2 weeks ago.  Mild intermittent h/a above right eye for the first couple of days, but no h/a since.  Denies other sx.  No imaging studies.  History of OSA dx. in 2016 (managed by cardiolog--hx. of MI with one stent placement).   Sts. he is compliant with CPAP, machine from [facility]./fim    HISTORY OF PRESENT ILLNESS:  I had the pleasure seeing you patient, Colin [name], at [facility] for neurologic consultation regarding his left visual field defect for the past 2 weeks.  He was at a church meeting in late January 2018.  He was walking with his pastor who was on his left side.   Suddenly he lost peripheral vision to the left and couldn't see him.   At the time, he had a dull ache over the right eye. He did not completely lose vision to the left as he could still make out movements.   He feels the vision is unchanged or mildly better compared to 2 weeks ago.   He notes that the lower left visual loss is worse than the upper left visual changes.     He denies numbness, weakness, clumsiness.   He denies diplopia.  He denis left eye pain but feels a Rago puffy on that side.     He saw Dr. Gwynn Burly (optometry).  I reviewed the visit notes and ophthalmoscopy showed no papilledema.   He had mild HTN changes bilaterally. Humphrey visual field testing was outside normal limits c/w left inferior quadrantopsia bilaterally.  He denies any numbness, weakness, change in gait or balance. There is  no double vision.  Stoke risk factors include AFib (now in NSR), OSA (compliant with CPAP now), dilated CHF with low EF%, NIDDM.   His mother had strokes and died in 61 at age 10.  He had a myocardial infarction and a stent was placed by cardiology.   He was found to have paroxysmal atrial fibrillation and flutter and had a radiofrequency ablation. He has chronic systolic congestive heart failure.  An echocardiogram showed a dilated cardiomyopathy with an ejection fraction 35-40%  He has OSA and is on CPAP. A sleep study dated 04/28/2012 showed an AHI equals 14.7 with oxygen desaturations to 84%. He was titrated to a CPAP pressure of 15 cm.  initially, compliance was not good. He was reevaluated after his cardiac issues. He was found to have moderate OSA with an AHI equals 15.1 on PSG 01/07/2015 with desaturation to 85%.Marland Kitchen He was titrated to centimeters H2O (sees Fransico Him).  REVIEW OF SYSTEMS: Constitutional: No fevers, chills, sweats, or change in appetite Eyes: No visual changes, double vision, eye pain Ear, nose and throat: No hearing loss, ear pain, nasal congestion, sore throat Cardiovascular: No chest pain, palpitations Respiratory: No shortness of breath at rest or with exertion.   No wheezes GastrointestinaI: No nausea, vomiting, diarrhea, abdominal pain, fecal incontinence Genitourinary: No dysuria, urinary retention or frequency.  No nocturia. Musculoskeletal: No neck pain, back pain Integumentary: No rash,  pruritus, skin lesions Neurological: as above Psychiatric: No depression at this time.  No anxiety Endocrine: No palpitations, diaphoresis, change in appetite, change in weigh or increased thirst Hematologic/Lymphatic: No anemia, purpura, petechiae. Allergic/Immunologic: No itchy/runny eyes, nasal congestion, recent allergic reactions, rashes  ALLERGIES: No Known Allergies  HOME MEDICATIONS:  Current Outpatient Prescriptions:  .  atorvastatin (LIPITOR) 40 MG  tablet, Take 40 mg by mouth daily., Disp: , Rfl:  .  Canagliflozin-Metformin HCl 50-1000 MG TABS, Take 1 tablet by mouth 2 (two) times daily., Disp: , Rfl:  .  Cholecalciferol (VITAMIN D) 2000 UNITS tablet, Take 2,000 Units by mouth daily. , Disp: , Rfl:  .  CIALIS 20 MG tablet, Take 1 tablet by mouth daily as needed., Disp: , Rfl: 3 .  CVS ALCOHOL SWABS PADS, See admin instructions., Disp: , Rfl: 3 .  ELIQUIS 5 MG TABS tablet, TAKE 1 TABLET BY MOUTH TWICE A DAY, Disp: 60 tablet, Rfl: 6 .  JARDIANCE 10 MG TABS tablet, Take 10 mg by mouth every morning., Disp: , Rfl: 1 .  LEVEMIR FLEXTOUCH 100 UNIT/ML Pen, INJECT 40 UNITS EVERY DAY, Disp: , Rfl: 0 .  levothyroxine (SYNTHROID, LEVOTHROID) 150 MCG tablet, Take 150 mcg by mouth daily before breakfast., Disp: , Rfl:  .  metoprolol tartrate (LOPRESSOR) 25 MG tablet, TAKE 1 TABLET (25 MG TOTAL) BY MOUTH 2 (TWO) TIMES DAILY., Disp: 60 tablet, Rfl: 9 .  Multiple Vitamins-Minerals (CENTRUM SILVER ADULT 50+ PO), Take 1 tablet by mouth daily. , Disp: , Rfl:  .  NOVOTWIST 32G X 5 MM MISC, See admin instructions., Disp: , Rfl: 3 .  pioglitazone (ACTOS) 30 MG tablet, Take 30 mg by mouth daily., Disp: , Rfl:  .  sotalol (BETAPACE) 80 MG tablet, TAKE 1 TABLET (80 MG TOTAL) BY MOUTH 2 (TWO) TIMES DAILY., Disp: 60 tablet, Rfl: 4  PAST MEDICAL HISTORY: Past Medical History:  Diagnosis Date  . Atrial flutter ([place])   . Chronic systolic dysfunction of left ventricle   . Dilated aortic root ([place]) 04/15/2015  . Hypertension   . Obesities, morbid ([place])   . OSA (obstructive sleep apnea)   . PAF (paroxysmal atrial fibrillation) (HCC)    s/p TEE/DCCV now on Sotolol  . PAF (paroxysmal atrial fibrillation) ([place]) 04/15/2015    PAST SURGICAL HISTORY: Past Surgical History:  Procedure Laterality Date  . CARDIOVERSION N/A 09/24/2014   Procedure: CARDIOVERSION;  Surgeon: Sueanne Margarita, MD;  Location: [facility] ENDOSCOPY;  Service: Cardiovascular;  Laterality: N/A;  . TEE  WITHOUT CARDIOVERSION N/A 09/24/2014   Procedure: TRANSESOPHAGEAL ECHOCARDIOGRAM (TEE);  Surgeon: Sueanne Margarita, MD;  Location: [facility] ENDOSCOPY;  Service: Cardiovascular;  Laterality: N/A;    FAMILY HISTORY: Family History  Problem Relation Age of Onset  . Stroke Mother   . Heart Problems Father     weak heart  . Heart Problems Paternal Grandfather     pacemaker    SOCIAL HISTORY:  Social History   Social History  . Marital status: Married    Spouse name: N/A  . Number of children: N/A  . Years of education: N/A   Occupational History  . Not on file.   Social History Main Topics  . Smoking status: Former Research scientist (life sciences)  . Smokeless tobacco: Never Used  . Alcohol use No  . Drug use: No  . Sexual activity: Not on file   Other Topics Concern  . Not on file   Social History Narrative  . No narrative on file  PHYSICAL EXAM  Vitals:   06/20/16 0843  BP: 122/80  Pulse: 62  Resp: 18  Weight: 229 lb (103.9 kg)  Height: '5\' 11"'  (1.803 m)    Body mass index is 31.94 kg/m.   General: The patient is well-developed and well-nourished and in no acute distress  Eyes:  Funduscopic exam shows normal optic discs and retinal vessels.  Neck: The neck is supple, no carotid bruits are noted.  The neck is nontender.  Cardiovascular: The heart has a regular rate and rhythm with a normal S1 and S2. There were no murmurs, gallops or rubs. Lungs are clear to auscultation.  Skin: Extremities are without significant edema.  Musculoskeletal:  Back is nontender  Neurologic Exam  Mental status: The patient is alert and oriented x 3 at the time of the examination. The patient has apparent normal recent and remote memory, with an apparently normal attention span and concentration ability.   Speech is normal.  Cranial nerves: Extraocular movements are full. Pupils are equal, round, and reactive to light and accomodation.  On visual field testing to confrontation, he had a lower left  quadrantanopsia OS but normal OD.  Facial symmetry is present. There is good facial sensation to soft touch bilaterally.Facial strength is normal.  Trapezius and sternocleidomastoid strength is normal. No dysarthria is noted.  The tongue is midline, and the patient has symmetric elevation of the soft palate. No obvious hearing deficits are noted.  Motor:  Muscle bulk is normal.   Tone is normal. Strength is  5 / 5 in all 4 extremities.   Sensory: Sensory testing is intact to pinprick, soft touch and vibration sensation in all 4 extremities.  Coordination: Cerebellar testing reveals good finger-nose-finger and heel-to-shin bilaterally.  Gait and station: Station is normal.   Gait is normal. Tandem gait is normal. Romberg is negative.   Reflexes: Deep tendon reflexes are symmetric and normal bilaterally.   Plantar responses are flexor.    DIAGNOSTIC DATA (LABS, IMAGING, TESTING) - I reviewed patient records, labs, notes, testing and imaging myself where available.  Lab Results  Component Value Date   WBC 7.5 04/15/2015   HGB 16.3 04/15/2015   HCT 47.4 04/15/2015   MCV 83.2 04/15/2015   PLT 224 04/15/2015      Component Value Date/Time   NA 138 04/15/2015 1608   K 4.1 04/15/2015 1608   CL 103 04/15/2015 1608   CO2 25 04/15/2015 1608   GLUCOSE 213 (H) 04/15/2015 1608   BUN 22 04/15/2015 1608   CREATININE 1.19 04/15/2015 1608   CALCIUM 9.9 04/15/2015 1608   GFRNONAA >60 09/14/2014 1630   GFRAA >60 09/14/2014 1630       ASSESSMENT AND PLAN  Visual field loss - Plan: Sedimentation rate, Homocysteine, C-reactive protein, MR BRAIN W WO CONTRAST, MR MRA HEAD WO CONTRAST, MR MRA NECK W WO CONTRAST  Chronic systolic heart failure (HCC)  PAF (paroxysmal atrial fibrillation) (HCC)  Cardiovascular disease - Plan: MR BRAIN W WO CONTRAST, MR MRA HEAD WO CONTRAST, MR MRA NECK W WO CONTRAST  Type 2 diabetes mellitus without complication, with long-term current use of insulin  (HCC)    In summary, Franciszek Warshaw is a 61 year old man with a left inferior quadrantanopsia that came on suddenly about 2 weeks ago. He continues to have a visual field defect me worse out of the left eye the right eye.   The Humphrey visual field test is significantly more accurate than confrontational visual field testing and showed  bilateral left inferior quadrantanopsia by report. Therefore, a right parietal or occipital stroke is most likely.    He has many risk factors including CHF with low ejection fraction and atrial fibrillation. He will take Eliquis for prophylaxis.   I will check an MRI of the brain, and MRA of the intracranial and neck arteries to determine the extent of the stroke and also whether he has intracranial or extracranial arterial stenosis . Further evaluation will be necessary based on results.  I'll also check ESR, CRP and homocystine for other stroke risk factors.  He is advised to be careful with activities that require lower visual field acuity.    He will return to see me in 6 weeks or sooner if there are new or worsening neurologic symptoms.  We will also call him after the results of the tests are back.  Thank you for asking me to see Mr. Osuna. Please let me know if I can be of further assistance with him or other patients in the future.   Richard A. Felecia Shelling, MD, PhD 4/00/8676, 1:95 AM Certified in Neurology, Clinical Neurophysiology, Sleep Medicine, Pain Medicine and Neuroimaging  [facility] [address] [place], [place] [address] [phone]

## 2016-06-21 LAB — C-REACTIVE PROTEIN: CRP: 4.8 mg/L (ref 0.0–4.9)

## 2016-06-21 LAB — SEDIMENTATION RATE: Sed Rate: 2 mm/hr (ref 0–30)

## 2016-06-21 LAB — HOMOCYSTEINE: HOMOCYSTEINE: 13.1 umol/L (ref 0.0–15.0)

## 2016-06-22 ENCOUNTER — Telehealth: Payer: Self-pay | Admitting: *Deleted

## 2016-06-22 NOTE — Telephone Encounter (Signed)
-----   Message from Asa Lenteichard A Sater, MD sent at 06/21/2016  5:23 PM EST ----- Please let him know the labwork was normal.

## 2016-06-22 NOTE — Telephone Encounter (Signed)
LMOM that per RAS, labs done in our office are normal; he does not need to return this call unless he has questions/fim

## 2016-06-30 ENCOUNTER — Other Ambulatory Visit: Payer: Self-pay | Admitting: Internal Medicine

## 2016-06-30 ENCOUNTER — Other Ambulatory Visit: Payer: Self-pay | Admitting: Cardiology

## 2016-07-10 ENCOUNTER — Other Ambulatory Visit: Payer: Self-pay | Admitting: Neurology

## 2016-07-12 ENCOUNTER — Other Ambulatory Visit: Payer: Self-pay | Admitting: Neurology

## 2016-07-13 ENCOUNTER — Ambulatory Visit
Admission: RE | Admit: 2016-07-13 | Discharge: 2016-07-13 | Disposition: A | Discharge status: Home / Self Care | Payer: PRIVATE HEALTH INSURANCE | Source: Ambulatory Visit | Attending: Neurology | Admitting: Neurology

## 2016-07-13 DIAGNOSIS — I2510 Atherosclerotic heart disease of native coronary artery without angina pectoris: Secondary | ICD-10-CM

## 2016-07-13 DIAGNOSIS — H5340 Unspecified visual field defects: Secondary | ICD-10-CM

## 2016-07-13 MED ORDER — GADOBENATE DIMEGLUMINE 529 MG/ML IV SOLN
20.0000 mL | Freq: Once | INTRAVENOUS | Status: AC | PRN
  Administered 2016-07-13: 20 mL via INTRAVENOUS

## 2016-07-16 ENCOUNTER — Telehealth: Payer: Self-pay | Admitting: Neurology

## 2016-07-16 NOTE — Telephone Encounter (Signed)
I spoke with Mr. Colin [name].   MRI shows a right parieto-occipital hemorrhagic CVA in a location that well explains his visual symptoms.    MRA of the neck was normal.   MRA of the brain showed mild left PCA stenosis (opposite side).   Most likely source of CVA is his AFib.   He is already on Eliquis.  He reports BP is good  Recommend continuing on Eliquis.   I will see him back in about 6 weeks and he should call sooner if new symptoms

## 2016-08-01 ENCOUNTER — Other Ambulatory Visit: Payer: Self-pay | Admitting: Internal Medicine

## 2016-08-02 ENCOUNTER — Other Ambulatory Visit: Payer: Self-pay | Admitting: Cardiology

## 2016-08-02 ENCOUNTER — Encounter: Payer: Self-pay | Admitting: Neurology

## 2016-08-02 ENCOUNTER — Encounter (INDEPENDENT_AMBULATORY_CARE_PROVIDER_SITE_OTHER): Payer: Self-pay

## 2016-08-02 ENCOUNTER — Ambulatory Visit (INDEPENDENT_AMBULATORY_CARE_PROVIDER_SITE_OTHER): Payer: PRIVATE HEALTH INSURANCE | Admitting: Neurology

## 2016-08-02 VITALS — BP 116/64 | HR 70 | Ht 71.0 in | Wt 247.0 lb

## 2016-08-02 DIAGNOSIS — I480 Paroxysmal atrial fibrillation: Secondary | ICD-10-CM | POA: Diagnosis not present

## 2016-08-02 DIAGNOSIS — G4733 Obstructive sleep apnea (adult) (pediatric): Secondary | ICD-10-CM | POA: Diagnosis not present

## 2016-08-02 DIAGNOSIS — I639 Cerebral infarction, unspecified: Secondary | ICD-10-CM

## 2016-08-02 DIAGNOSIS — H53462 Homonymous bilateral field defects, left side: Secondary | ICD-10-CM

## 2016-08-02 DIAGNOSIS — Z9989 Dependence on other enabling machines and devices: Secondary | ICD-10-CM

## 2016-08-02 NOTE — Progress Notes (Signed)
[facility]  PATIENT: Consepcion HearingBruce E [name] DOB: 02/01/1956  REFERRING DOCTOR OR PCP:  Dorothyann Pengobyn Sanders (PCP),  Dr. Santiago Bumpershurmond (ophth) referred (fax [phone]) SOURCE: patient, notes from Dr. Marti Sleighhurman OD, notes from cardiology and sleep, sleep study, echo report      _________________________________   HISTORICAL  CHIEF COMPLAINT:  Chief Complaint  Patient presents with  . Visual Disburbance    Sts. Colin [name] believes visio left eye is some better. MRI brain showed CVA, likely from A-Fib. MRA neck normal and MRA brain showed mild left PCA stenosis.Sts. Colin [name] is taking Eliquis as rx'd.  Pulse normal rate, regular to palpation today/fim  . Cerebrovascular Accident    HISTORY OF PRESENT ILLNESS:   Colin [name] Is a 61 year old man who had the onset of left inferior quadrantanopsia in late January 2018.    MRI 3/102018 shows a subacute stroke in the right parieto-occipital region that could explain his visual field defect.   MR angiogram showed mild left PCA stenosis but no stenosis in the right PCA or other arteries. MRA of the neck was essentially normal.   Since his last visit with me about 6 weeks ago, Colin [name] feels that the vision has improved in the left lower visual field. Colin [name] still sees better on the right lower that Colin [name] does in the left lower but is able to count fingers and discern features of objects in that field.  Colin [name] denies any numbness, weakness, change in gait or balance. There is no double vision.  Stoke risk factors include AFib (now in NSR), OSA (compliant with CPAP now), dilated CHF with low EF%, Insulin dependent Type II DM (HbgA1c has been around 9 but FSG typically around 150)   His mother had strokes and died in [address]1991 at age 60.  Cardiac:  In the past, Colin [name] had a myocardial infarction and a stent was placed by cardiology.   Colin [name] was found to have paroxysmal atrial fibrillation and flutter and had a radiofrequency ablation. Colin [name] has chronic systolic congestive heart failure.  An  echocardiogram showed a dilated cardiomyopathy with an ejection fraction 35-40%.   More recent EF% was 60%  OSA:   Colin [name] has OSA and is on CPAP. A sleep study dated 04/28/2012 showed an AHI equals 14.7 with oxygen desaturations to 84%. Colin [name] was titrated to a CPAP pressure of 15 cm.  initially, compliance was not good. Colin [name] was reevaluated after his cardiac issues. Colin [name] was found to have moderate OSA with an AHI equals 15.1 on PSG 01/07/2015 with desaturation to 85%.Marland Kitchen. Colin [name] was titrated to centimeters H2O (sees Armanda Magicraci Turner).  Stroke History:   Colin [name] was at a church meeting in late January 2018.  Colin [name] was walking with his pastor who was on his left side.   Suddenly Colin [name] lost peripheral vision to the left and couldn't see him.   At the time, Colin [name] had a dull ache over the right eye. Colin [name] did not completely lose vision to the left as Colin [name] could still make out movements.   Colin [name] feels the vision is unchanged or mildly better compared to 2 weeks ago.   Colin [name] notes that the lower left visual loss is worse than the upper left visual changes.     Colin [name] denies numbness, weakness, clumsiness.   Colin [name] denies diplopia.  Colin [name] denis left eye pain but feels a Dillard puffy on that side.     Colin [name] saw Dr. Santiago Bumpershurmond (optometry).  I reviewed the visit notes and ophthalmoscopy showed no papilledema.  Colin [name] had mild HTN changes bilaterally. Humphrey visual field testing was outside normal limits c/w left inferior quadrantopsia bilaterally.   MRI 07/14/2016 showed a subacute stroke in the right parietal occipital region. No significant arterial stenosis is noted in the neck. The left PCA had mild stenosis but the right PCA and other arteries were normal.  REVIEW OF SYSTEMS: Constitutional: No fevers, chills, sweats, or change in appetite Eyes: No visual changes, double vision, eye pain Ear, nose and throat: No hearing loss, ear pain, nasal congestion, sore throat Cardiovascular: No chest pain, palpitations Respiratory: No shortness of breath at rest or with exertion.    No wheezes GastrointestinaI: No nausea, vomiting, diarrhea, abdominal pain, fecal incontinence Genitourinary: No dysuria, urinary retention or frequency.  No nocturia. Musculoskeletal: No neck pain, back pain Integumentary: No rash, pruritus, skin lesions Neurological: as above Psychiatric: No depression at this time.  No anxiety Endocrine: No palpitations, diaphoresis, change in appetite, change in weigh or increased thirst Hematologic/Lymphatic: No anemia, purpura, petechiae. Allergic/Immunologic: No itchy/runny eyes, nasal congestion, recent allergic reactions, rashes  ALLERGIES: No Known Allergies  HOME MEDICATIONS:  Current Outpatient Prescriptions:  .  atorvastatin (LIPITOR) 40 MG tablet, Take 40 mg by mouth daily., Disp: , Rfl:  .  Canagliflozin-Metformin HCl 50-1000 MG TABS, Take 1 tablet by mouth 2 (two) times daily., Disp: , Rfl:  .  Cholecalciferol (VITAMIN D) 2000 UNITS tablet, Take 2,000 Units by mouth daily. , Disp: , Rfl:  .  CIALIS 20 MG tablet, Take 1 tablet by mouth daily as needed., Disp: , Rfl: 3 .  CVS ALCOHOL SWABS PADS, See admin instructions., Disp: , Rfl: 3 .  ELIQUIS 5 MG TABS tablet, TAKE 1 TABLET BY MOUTH TWICE A DAY, Disp: 60 tablet, Rfl: 6 .  JARDIANCE 10 MG TABS tablet, Take 10 mg by mouth every morning., Disp: , Rfl: 1 .  LEVEMIR FLEXTOUCH 100 UNIT/ML Pen, INJECT 40 UNITS EVERY DAY, Disp: , Rfl: 0 .  levothyroxine (SYNTHROID, LEVOTHROID) 150 MCG tablet, Take 150 mcg by mouth daily before breakfast., Disp: , Rfl:  .  metoprolol tartrate (LOPRESSOR) 25 MG tablet, Take 1 tablet (25 mg total) by mouth 2 (two) times daily. *Patient is overdue for an appt and must call and schedule for further refills*, Disp: 30 tablet, Rfl: 0 .  Multiple Vitamins-Minerals (CENTRUM SILVER ADULT 50+ PO), Take 1 tablet by mouth daily. , Disp: , Rfl:  .  NOVOTWIST 32G X 5 MM MISC, See admin instructions., Disp: , Rfl: 3 .  pioglitazone (ACTOS) 30 MG tablet, Take 30 mg by mouth  daily., Disp: , Rfl:  .  sotalol (BETAPACE) 80 MG tablet, Take 1 tablet (80 mg total) by mouth 2 (two) times daily. *Patient is overdue for an appt and must call and schedule for further refills*, Disp: 30 tablet, Rfl: 0  PAST MEDICAL HISTORY: Past Medical History:  Diagnosis Date  . Atrial flutter (HCC)   . Chronic systolic dysfunction of left ventricle   . Dilated aortic root (HCC) 04/15/2015  . Hypertension   . Obesities, morbid (HCC)   . OSA (obstructive sleep apnea)   . PAF (paroxysmal atrial fibrillation) (HCC)    s/p TEE/DCCV now on Sotolol  . PAF (paroxysmal atrial fibrillation) (HCC) 04/15/2015    PAST SURGICAL HISTORY: Past Surgical History:  Procedure Laterality Date  . CARDIOVERSION N/A 09/24/2014   Procedure: CARDIOVERSION;  Surgeon: Quintella Reichert, MD;  Location: [facility] ENDOSCOPY;  Service: Cardiovascular;  Laterality: N/A;  . TEE WITHOUT  CARDIOVERSION N/A 09/24/2014   Procedure: TRANSESOPHAGEAL ECHOCARDIOGRAM (TEE);  Surgeon: Quintella Reichert, MD;  Location: [facility] ENDOSCOPY;  Service: Cardiovascular;  Laterality: N/A;    FAMILY HISTORY: Family History  Problem Relation Age of Onset  . Stroke Mother   . Heart Problems Father     weak heart  . Heart Problems Paternal Grandfather     pacemaker    SOCIAL HISTORY:  Social History   Social History  . Marital status: Married    Spouse name: N/A  . Number of children: N/A  . Years of education: N/A   Occupational History  . Not on file.   Social History Main Topics  . Smoking status: Former Games developer  . Smokeless tobacco: Never Used  . Alcohol use No  . Drug use: No  . Sexual activity: Not on file   Other Topics Concern  . Not on file   Social History Narrative  . No narrative on file     PHYSICAL EXAM  Vitals:   08/02/16 1524  BP: 116/64  Pulse: 70  Weight: 247 lb (112 kg)  Height: 5\' 11"  (1.803 m)    Body mass index is 34.45 kg/m.   General: The patient is well-developed and well-nourished and in  no acute distress   Cardiovascular: The heart has a regular rate and rhythm with a normal S1 and S2. There were no murmurs, gallops or rubs.   Skin: Extremities are without significant edema.   Neurologic Exam  Mental status: The patient is alert and oriented x 3 at the time of the examination. The patient has apparent normal recent and remote memory, with an apparently normal attention span and concentration ability.   Speech is normal.  Cranial nerves: Extraocular movements are full. Pupils are equal, round, and reactive to light and accomodation.  On visual field testing to confrontation, Colin [name] had a partial lower left quadrantanopsia OS worse than OD.   Facial strength and sensation is normal.  Trapezius and sternocleidomastoid strength is normal. No dysarthria is noted.  The tongue is midline, and the patient has symmetric elevation of the soft palate. No obvious hearing deficits are noted.  Motor:  Muscle bulk is normal.   Tone is normal. Strength is  5 / 5 in all 4 extremities.   Sensory: Sensory testing is intact to pinprick, soft touch and vibration sensation in all 4 extremities.  Coordination: Cerebellar testing reveals good finger-nose-finger bilaterally.  Gait and station: Station is normal.   Gait is normal. Tandem gait is normal. Romberg is negative.   Reflexes: Deep tendon reflexes are symmetric and normal bilaterally.        DIAGNOSTIC DATA (LABS, IMAGING, TESTING) - I reviewed patient records, labs, notes, testing and imaging myself where available.  Lab Results  Component Value Date   WBC 7.5 04/15/2015   HGB 16.3 04/15/2015   HCT 47.4 04/15/2015   MCV 83.2 04/15/2015   PLT 224 04/15/2015      Component Value Date/Time   NA 138 04/15/2015 1608   K 4.1 04/15/2015 1608   CL 103 04/15/2015 1608   CO2 25 04/15/2015 1608   GLUCOSE 213 (H) 04/15/2015 1608   BUN 22 04/15/2015 1608   CREATININE 1.19 04/15/2015 1608   CALCIUM 9.9 04/15/2015 1608   GFRNONAA >60  09/14/2014 1630   GFRAA >60 09/14/2014 1630       ASSESSMENT AND PLAN  Cerebrovascular accident (CVA), unspecified mechanism (HCC)  Homonymous bilateral field defects in visual field, left  OSA on CPAP  PAF (paroxysmal atrial fibrillation) (HCC)   1.    Colin [name] had visual field changes from a right parietal occipital stroke and there was some evidence of hemorrhage. As a tic strokes are uncommon in this region, most likely this was an ischemic stroke, possibly embolic that had some hemorrhagic conversion.     Colin [name] also has evidence of chronic microvascular ischemic change. Colin [name] should continue on the Eliquis. If this is ever discontinued consider aspirin plus Plavix. 2.   Colin [name] is compliant with CPAP for his OSA and is compliant with all of his medications. 3.   Colin [name] will follow-up in one year or sooner if there are new or worsening neurologic symptoms.  Richard A. Epimenio Foot, MD, PhD 08/02/2016, 3:59 PM Certified in Neurology, Clinical Neurophysiology, Sleep Medicine, Pain Medicine and Neuroimaging  [facility] [address], [address] [place], [place] [address] [phone]

## 2016-08-26 ENCOUNTER — Other Ambulatory Visit: Payer: Self-pay | Admitting: Cardiology

## 2016-08-26 ENCOUNTER — Other Ambulatory Visit: Payer: Self-pay | Admitting: Internal Medicine

## 2016-09-19 ENCOUNTER — Telehealth: Payer: Self-pay | Admitting: Cardiology

## 2016-09-19 MED ORDER — METOPROLOL TARTRATE 25 MG PO TABS: 25.0000 mg | ORAL_TABLET | Freq: Two times a day (BID) | ORAL | 2 refills | Status: DC

## 2016-09-19 MED ORDER — SOTALOL HCL 80 MG PO TABS: ORAL_TABLET | ORAL | 2 refills | Status: DC

## 2016-09-19 NOTE — Telephone Encounter (Signed)
New message    Pt is calling because he is out of his medication.    *STAT* If patient is at the pharmacy, call can be transferred to refill team.   1. Which medications need to be refilled? (please list name of each medication and dose if known) metoprolol sotalol  2. Which pharmacy/location (including street and city if local pharmacy) is medication to be sent to? [facility] [address]   3. Do they need a 30 day or 90 day supply? 30

## 2016-09-19 NOTE — Telephone Encounter (Signed)
Pt's medications was sent to pt's pharmacy as requested. Confirmation received.  

## 2016-10-11 ENCOUNTER — Other Ambulatory Visit (HOSPITAL_COMMUNITY): Payer: Self-pay | Admitting: Nurse Practitioner

## 2016-10-11 NOTE — Telephone Encounter (Signed)
Age 61 years  Saw Dr Mayford Knifeurner on 04/25/2015 and has appt to see Dr Mayford Knifeurner 11/22/2016 08/02/2016 Wt 112kg Lab work done by Dr Allyne GeeSanders 07/10/2016 Hgb 16.3 HCT 48.1  SrCr  0.94 done on 10/09/2016  in Dr Allyne GeeSanders office Refill done as requested Eliquis 5mg  q 12 hours

## 2016-11-22 ENCOUNTER — Encounter: Payer: Self-pay | Admitting: Cardiology

## 2016-11-22 ENCOUNTER — Ambulatory Visit (INDEPENDENT_AMBULATORY_CARE_PROVIDER_SITE_OTHER): Payer: PRIVATE HEALTH INSURANCE | Admitting: Cardiology

## 2016-11-22 ENCOUNTER — Encounter (INDEPENDENT_AMBULATORY_CARE_PROVIDER_SITE_OTHER): Payer: Self-pay

## 2016-11-22 VITALS — BP 126/62 | HR 60 | Ht 71.0 in | Wt 250.8 lb

## 2016-11-22 DIAGNOSIS — I77810 Thoracic aortic ectasia: Secondary | ICD-10-CM

## 2016-11-22 DIAGNOSIS — I481 Persistent atrial fibrillation: Secondary | ICD-10-CM

## 2016-11-22 DIAGNOSIS — Z9989 Dependence on other enabling machines and devices: Secondary | ICD-10-CM | POA: Diagnosis not present

## 2016-11-22 DIAGNOSIS — G4733 Obstructive sleep apnea (adult) (pediatric): Secondary | ICD-10-CM | POA: Diagnosis not present

## 2016-11-22 DIAGNOSIS — I4819 Other persistent atrial fibrillation: Secondary | ICD-10-CM

## 2016-11-22 NOTE — Progress Notes (Signed)
Cardiology Office Note:    Date:  11/22/2016   ID:  Colin [name], DOB [birth year], MRN [id number]  PCP:  Dorothyann Peng, MD  Cardiologist:  Armanda Magic, MD    Referring MD: Dorothyann Peng, MD   Chief Complaint  Patient presents with  . Atrial Fibrillation  . Sleep Apnea    History of Present Illness:    Colin [name] is a 61 y.o. male with a hx of OSA on CPAP who is here for followup.  He has moderate OSA with an AHI of 15/hr with oxygen saturations < 85%.  He is on CPAP at 9cm H2O.  He is doing well with his CPAP therapy.  He tolerates the full face mask and has been using and benefiting from CPAP use.  He feels the pressure is adequate.  He continues to feel rested in the am with less daytime sleepiness but does still nap when he gets home from work since he has to be at work at [employer].  He has some mouth but resolves after drinking water.  He does not snore when using CPAP.  He also has paroxysmal atrial flutter and atrial fibrillation and is followed in afib clinic s/p ablation, dilated aortic root and HTN.  He denies any recent palpitations, dizziness, chest pain, SOB, DOE, PND, orthopnea or LE edema.   Past Medical History:  Diagnosis Date  . Atrial flutter (HCC)   . DCM (dilated cardiomyopathy) (HCC)    EF 35-40% years ago but echo a year ago showed normal LVF  . Dilated aortic root (HCC) 04/15/2015  . Hypertension   . Obesities, morbid (HCC)   . OSA (obstructive sleep apnea)   . Persistent atrial fibrillation [facility])    s/p TEE/DCCV now on Sotolol    Past Surgical History:  Procedure Laterality Date  . CARDIOVERSION N/A 09/24/2014   Procedure: CARDIOVERSION;  Surgeon: Quintella Reichert, MD;  Location: [facility] ENDOSCOPY;  Service: Cardiovascular;  Laterality: N/A;  . TEE WITHOUT CARDIOVERSION N/A 09/24/2014   Procedure: TRANSESOPHAGEAL ECHOCARDIOGRAM (TEE);  Surgeon: Quintella Reichert, MD;  Location: [facility] ENDOSCOPY;  Service: Cardiovascular;  Laterality: N/A;    Current  Medications: Current Meds  Medication Sig  . atorvastatin (LIPITOR) 40 MG tablet Take 40 mg by mouth daily.  Marland Kitchen CIALIS 20 MG tablet Take 1 tablet by mouth daily as needed.  Marland Kitchen ELIQUIS 5 MG TABS tablet TAKE 1 TABLET BY MOUTH TWICE A DAY  . JARDIANCE 10 MG TABS tablet Take 10 mg by mouth every morning.  Marland Kitchen levothyroxine (SYNTHROID, LEVOTHROID) 150 MCG tablet Take 150 mcg by mouth daily before breakfast.  . metoprolol tartrate (LOPRESSOR) 25 MG tablet Take 1 tablet (25 mg total) by mouth 2 (two) times daily.  . Multiple Vitamins-Minerals (CENTRUM SILVER ADULT 50+ PO) Take 1 tablet by mouth daily.   Marland Kitchen NOVOTWIST 32G X 5 MM MISC See admin instructions.  . sotalol (BETAPACE) 80 MG tablet TAKE 1 TABLET BY MOUTH TWICE A DAY.  . TRESIBA FLEXTOUCH 200 UNIT/ML SOPN INJECT 40 UNITS SUBCUTANEOUSLY AT BEDTIME  . Vitamin D, Ergocalciferol, (DRISDOL) 50000 units CAPS capsule TAKE ONE CAPSULE BY MOUTH ON TUESDAYS AND FRIDAYS  . [DISCONTINUED] pioglitazone (ACTOS) 30 MG tablet Take 30 mg by mouth daily.     Allergies:   Patient has no known allergies.   Social History   Social History  . Marital status: Married    Spouse name: N/A  . Number of children: N/A  . Years of education:  N/A   Social History Main Topics  . Smoking status: Former Games developermoker  . Smokeless tobacco: Never Used  . Alcohol use No  . Drug use: No  . Sexual activity: Not Asked   Other Topics Concern  . None   Social History Narrative  . None     Family History: The patient's family history includes Heart Problems in his father and paternal grandfather; Stroke in his mother. ROS:   Please see the history of present illness.     All other systems reviewed and are negative.  EKGs/Labs/Other Studies Reviewed:    The following studies were reviewed today: CPAP download   EKG:  EKG is not ordered today.    Recent Labs: No results found for requested labs within last 8760 hours.  Recent Lipid Panel No results found for: CHOL,  TRIG, HDL, CHOLHDL, VLDL, LDLCALC, LDLDIRECT  Physical Exam:    VS:  BP 126/62   Pulse 60   Ht 5\' 11"  (1.803 m)   Wt 250 lb 12.8 oz (113.8 kg)   BMI 34.98 kg/m     Wt Readings from Last 3 Encounters:  11/22/16 250 lb 12.8 oz (113.8 kg)  08/02/16 247 lb (112 kg)  06/20/16 229 lb (103.9 kg)     GEN:  Well nourished, well developed in no acute distress HEENT: Normal NECK: No JVD; No carotid bruits LYMPHATICS: No lymphadenopathy CARDIAC: RRR, no murmurs, rubs, gallops RESPIRATORY:  Clear to auscultation without rales, wheezing or rhonchi  ABDOMEN: Soft, non-tender, non-distended MUSCULOSKELETAL:  No edema; No deformity  SKIN: Warm and dry NEUROLOGIC:  Alert and oriented x 3 PSYCHIATRIC:  Normal affect   ASSESSMENT:    1. Persistent atrial fibrillation (HCC)   2. Dilated aortic root (HCC)   3. OSA on CPAP   4. Morbid obesity (HCC)    PLAN:    In order of problems listed above:  1. Persistent atrial fibrillation/paroxysmal atrial flutter - he is maintaining NSR.  He is followed in afib clinic.  He will continue on Eliquis 5mg  BID, Lopressor 25mg  BID and Sotolol 80mg  BID.  I will check BMET and CBC for chronic monitoring of NOAC labs.  He denies any problems with bleeding and is compliant with all his meds.   2.   Dilated aortic root @ 38mm by echo a year ago.  Will repeat for stability. Continue statin.  I will get a copy of last lipid panel from PCP.   3.   OSA - the patient is tolerating PAP therapy well without any problems. The PAP download was reviewed today and showed an AHI of 0.8/hr on 9 cm H2O with 67% compliance in using more than 4 hours nightly.  The patient has been using and benefiting from CPAP use and will continue to benefit from therapy.   4.   Obesity - (BMI is 35) - I have encouraged him to get into a routine exercise program and cut back on carbs and portions.      Medication Adjustments/Labs and Tests Ordered: Current medicines are reviewed at  length with the patient today.  Concerns regarding medicines are outlined above.  No orders of the defined types were placed in this encounter.  No orders of the defined types were placed in this encounter.   Signed, Armanda Magicraci Hayla Hinger, MD  11/22/2016 3:07 PM    [facility]

## 2016-11-22 NOTE — Patient Instructions (Signed)
Medication Instructions:  Your physician recommends that you continue on your current medications as directed. Please refer to the Current Medication list given to you today.   Labwork: TODAY: BMET, CBC  Testing/Procedures: Dr. Mayford Knifeurner recommends you reschedule your ECHO to sooner.  Follow-Up: Your physician wants you to follow-up in: 6 months with Dr. Mayford Knifeurner. You will receive a reminder letter in the mail two months in advance. If you don't receive a letter, please call our office to schedule the follow-up appointment.   Any Other Special Instructions Will Be Listed Below (If Applicable).     If you need a refill on your cardiac medications before your next appointment, please call your pharmacy.

## 2016-11-23 LAB — CBC WITH DIFFERENTIAL/PLATELET
BASOS: 0 %
Basophils Absolute: 0 10*3/uL (ref 0.0–0.2)
EOS (ABSOLUTE): 0.1 10*3/uL (ref 0.0–0.4)
EOS: 1 %
HEMATOCRIT: 42.9 % (ref 37.5–51.0)
Hemoglobin: 14.6 g/dL (ref 13.0–17.7)
IMMATURE GRANS (ABS): 0 10*3/uL (ref 0.0–0.1)
IMMATURE GRANULOCYTES: 0 %
Lymphocytes Absolute: 2 10*3/uL (ref 0.7–3.1)
Lymphs: 26 %
MCH: 28.5 pg (ref 26.6–33.0)
MCHC: 34 g/dL (ref 31.5–35.7)
MCV: 84 fL (ref 79–97)
MONOS ABS: 0.6 10*3/uL (ref 0.1–0.9)
Monocytes: 7 %
NEUTROS ABS: 5 10*3/uL (ref 1.4–7.0)
NEUTROS PCT: 66 %
Platelets: 176 10*3/uL (ref 150–379)
RBC: 5.12 x10E6/uL (ref 4.14–5.80)
RDW: 15.3 % (ref 12.3–15.4)
WBC: 7.7 10*3/uL (ref 3.4–10.8)

## 2016-11-23 LAB — BASIC METABOLIC PANEL
BUN / CREAT RATIO: 16 (ref 10–24)
BUN: 17 mg/dL (ref 8–27)
CO2: 21 mmol/L (ref 20–29)
Calcium: 9.3 mg/dL (ref 8.6–10.2)
Chloride: 101 mmol/L (ref 96–106)
Creatinine, Ser: 1.04 mg/dL (ref 0.76–1.27)
GFR calc Af Amer: 90 mL/min/{1.73_m2} (ref >59)
GFR calc non Af Amer: 78 mL/min/{1.73_m2} (ref >59)
GLUCOSE: 197 mg/dL — AB (ref 65–99)
Potassium: 4.3 mmol/L (ref 3.5–5.2)
SODIUM: 140 mmol/L (ref 134–144)

## 2016-12-13 ENCOUNTER — Other Ambulatory Visit: Payer: Self-pay | Admitting: Cardiology

## 2016-12-21 ENCOUNTER — Other Ambulatory Visit: Payer: Self-pay

## 2016-12-21 ENCOUNTER — Ambulatory Visit (HOSPITAL_COMMUNITY): Payer: PRIVATE HEALTH INSURANCE | Attending: Cardiology

## 2016-12-21 DIAGNOSIS — I110 Hypertensive heart disease with heart failure: Secondary | ICD-10-CM | POA: Diagnosis not present

## 2016-12-21 DIAGNOSIS — I420 Dilated cardiomyopathy: Secondary | ICD-10-CM | POA: Insufficient documentation

## 2016-12-21 DIAGNOSIS — I080 Rheumatic disorders of both mitral and aortic valves: Secondary | ICD-10-CM | POA: Insufficient documentation

## 2016-12-21 DIAGNOSIS — Z6835 Body mass index (BMI) 35.0-35.9, adult: Secondary | ICD-10-CM | POA: Diagnosis not present

## 2016-12-21 DIAGNOSIS — I4892 Unspecified atrial flutter: Secondary | ICD-10-CM | POA: Insufficient documentation

## 2016-12-21 DIAGNOSIS — G4733 Obstructive sleep apnea (adult) (pediatric): Secondary | ICD-10-CM | POA: Diagnosis not present

## 2016-12-21 DIAGNOSIS — I77810 Thoracic aortic ectasia: Secondary | ICD-10-CM | POA: Diagnosis not present

## 2016-12-21 DIAGNOSIS — I481 Persistent atrial fibrillation: Secondary | ICD-10-CM | POA: Insufficient documentation

## 2016-12-21 DIAGNOSIS — I5022 Chronic systolic (congestive) heart failure: Secondary | ICD-10-CM | POA: Diagnosis not present

## 2016-12-28 ENCOUNTER — Telehealth: Payer: Self-pay | Admitting: Cardiology

## 2016-12-28 DIAGNOSIS — I77810 Thoracic aortic ectasia: Secondary | ICD-10-CM

## 2016-12-28 NOTE — Telephone Encounter (Signed)
Follow Up:    Returning Katie's call from yesterday,concerning his Echo results.

## 2016-12-28 NOTE — Telephone Encounter (Signed)
Copied from echo results done 12/21/16:  Notes recorded by Quintella Reichert, MD on 12/21/2016 at 5:19 PM EDT Echo showed mildly thickened heart muscle with low normal LVF with EF 50-55% and mild AR with mildly dilated aortic root and ascending aorta which has increased by 0.3cm in past year - please get a chest CT angio to evaluated aorta further  12/28/16--discussed with pt, pt agreed to get chest CT angio-pt aware I will forward to [facility] to contact pt to schedule.

## 2017-01-02 ENCOUNTER — Ambulatory Visit (INDEPENDENT_AMBULATORY_CARE_PROVIDER_SITE_OTHER)
Admission: RE | Admit: 2017-01-02 | Discharge: 2017-01-02 | Disposition: A | Discharge status: Home / Self Care | Payer: PRIVATE HEALTH INSURANCE | Source: Ambulatory Visit | Attending: Cardiology | Admitting: Cardiology

## 2017-01-02 DIAGNOSIS — I77810 Thoracic aortic ectasia: Secondary | ICD-10-CM | POA: Diagnosis not present

## 2017-01-02 MED ORDER — IOPAMIDOL (ISOVUE-370) INJECTION 76%
100.0000 mL | Freq: Once | INTRAVENOUS | Status: AC | PRN
  Administered 2017-01-02: 100 mL via INTRAVENOUS

## 2017-01-04 ENCOUNTER — Telehealth: Payer: Self-pay | Admitting: Cardiology

## 2017-01-04 DIAGNOSIS — I77810 Thoracic aortic ectasia: Secondary | ICD-10-CM

## 2017-01-04 NOTE — Telephone Encounter (Signed)
-----   Message from Quintella Reichertraci R Turner, MD sent at 01/03/2017 10:13 AM EDT ----- Echo and CT scan correlate with ascending aorta measuring 4.2cm.  Please repeat echo in 6 months to make sure it is stable.

## 2017-01-04 NOTE — Telephone Encounter (Signed)
New message ° ° ° ° °Returning a call to the nurse °

## 2017-01-04 NOTE — Telephone Encounter (Signed)
Informed patient of results and verbal understanding expressed.  Repeat echo ordered to be scheduled in 1 year. Patient agrees with treatment plan.  

## 2017-01-10 ENCOUNTER — Telehealth: Payer: Self-pay | Admitting: Cardiology

## 2017-01-10 ENCOUNTER — Telehealth: Payer: Self-pay

## 2017-01-10 NOTE — Telephone Encounter (Signed)
New message   [facility]

## 2017-01-10 NOTE — Telephone Encounter (Signed)
F/u message  Pt call requesting to speak with RN. He said he was returning RN call. Please call back to discuss if needed.

## 2017-01-10 NOTE — Telephone Encounter (Signed)
Informed scheduler when she called that I did not call the patient.   Attempted to call patient to reiterate to him there is no documentation of anyone calling him from the office.  No VM picked up after several rings.

## 2017-01-11 ENCOUNTER — Telehealth: Payer: Self-pay | Admitting: Cardiology

## 2017-01-11 NOTE — Telephone Encounter (Signed)
Called the patient and LVM to call back to schedule his echo for 06-2017.

## 2017-01-11 NOTE — Telephone Encounter (Signed)
Confirmed with patient there is no documentation of anyone calling since the last conversation with him and that nothing is needed from Nursing at this time. He was grateful for call back.

## 2017-01-14 ENCOUNTER — Other Ambulatory Visit (HOSPITAL_COMMUNITY): Payer: PRIVATE HEALTH INSURANCE

## 2017-04-22 ENCOUNTER — Other Ambulatory Visit (HOSPITAL_COMMUNITY): Payer: Self-pay | Admitting: Cardiology

## 2017-04-22 NOTE — Telephone Encounter (Signed)
Pt last saw Dr Mayford Knifeurner 11/22/16, last labs 11/22/16 Creat 1.04, age 61, weight 113.8kg, based on specified criteria pt is on appropriate dosage of Eliquis 5mg  BID.  Will refill rx.

## 2017-06-24 ENCOUNTER — Other Ambulatory Visit: Payer: Self-pay

## 2017-06-24 ENCOUNTER — Ambulatory Visit (HOSPITAL_COMMUNITY): Payer: PRIVATE HEALTH INSURANCE | Attending: Cardiovascular Disease

## 2017-06-24 ENCOUNTER — Encounter: Payer: Self-pay | Admitting: Cardiology

## 2017-06-24 DIAGNOSIS — I4891 Unspecified atrial fibrillation: Secondary | ICD-10-CM | POA: Insufficient documentation

## 2017-06-24 DIAGNOSIS — I77810 Thoracic aortic ectasia: Secondary | ICD-10-CM | POA: Diagnosis not present

## 2017-06-24 DIAGNOSIS — E669 Obesity, unspecified: Secondary | ICD-10-CM | POA: Insufficient documentation

## 2017-06-24 DIAGNOSIS — I4892 Unspecified atrial flutter: Secondary | ICD-10-CM | POA: Diagnosis not present

## 2017-06-24 DIAGNOSIS — I10 Essential (primary) hypertension: Secondary | ICD-10-CM | POA: Insufficient documentation

## 2017-06-24 DIAGNOSIS — I080 Rheumatic disorders of both mitral and aortic valves: Secondary | ICD-10-CM | POA: Insufficient documentation

## 2017-06-26 ENCOUNTER — Telehealth: Payer: Self-pay

## 2017-06-26 DIAGNOSIS — I77819 Aortic ectasia, unspecified site: Secondary | ICD-10-CM

## 2017-06-26 NOTE — Telephone Encounter (Signed)
Notes recorded by Phineas Semenobertson, Smantha Boakye, RN on 06/26/2017 at 8:22 AM EST Patient's spouse made aware of echo results. (DPR on file). Patient's spouse advised me to call to discuss results with patient as well. Left detailed message on VM of echo results. She verbalized understanding and thanked me for the call. Echo ordered to be scheduled in a year.   Notes recorded by Quintella Reicherturner, Traci R, MD on 06/24/2017 at 8:25 PM EST Stable dilated ascending aorta at 42mm compared to Chest CT 2018 - repeat echo in 1 year

## 2017-07-07 DIAGNOSIS — I10 Essential (primary) hypertension: Secondary | ICD-10-CM | POA: Insufficient documentation

## 2017-07-07 NOTE — Progress Notes (Signed)
Cardiology Office Note:    Date:  07/09/2017   ID:  Colin [name], DOB Sep 05, 1955, MRN [id number]  PCP:  Colin Peng, MD  Cardiologist:  No primary care provider on file.    Referring MD: Colin Peng, MD   Chief Complaint  Patient presents with  . Sleep Apnea  . Hypertension    History of Present Illness:    Colin [name] is a 62 y.o. male with a hx of moderate OSA with an AHI of 15/hr with oxygen saturations <85%. He is on CPAP at 9cm H2O.  He also has a history of paroxysmal atrial flutter and atrial fibrillation and is followed in afib clinic s/p ablation, dilated aortic root and HTN.  He is here today for followup and is doing well.  He denies any chest pain or pressure, SOB, DOE, PND, orthopnea, LE edema, dizziness, palpitations or syncope. He is compliant with his meds and is tolerating meds with no SE.  He is doing well with his CPAP device.  He tolerates the full face mask and feels the pressure is adequate.  Since going on CPAP he feels rested in the am and has no significant daytime sleepiness but at night he will falls alseep watching the news.Marland Kitchen  He denies any significant mouth or nasal dryness or nasal congestion.  He does not think that he snores.     Past Medical History:  Diagnosis Date  . Atrial flutter (HCC)   . DCM (dilated cardiomyopathy) (HCC)    EF 35-40% years ago but echo a year ago showed normal LVF  . Dilated aortic root (HCC) 04/15/2015   42mm by echo 06/2017 and Chest CT 12/2016  . Hypertension   . Obesities, morbid (HCC)   . OSA (obstructive sleep apnea)   . Persistent atrial fibrillation [facility])    s/p TEE/DCCV now on Sotolol    Past Surgical History:  Procedure Laterality Date  . CARDIOVERSION N/A 09/24/2014   Procedure: CARDIOVERSION;  Surgeon: Colin Reichert, MD;  Location: [facility] ENDOSCOPY;  Service: Cardiovascular;  Laterality: N/A;  . TEE WITHOUT CARDIOVERSION N/A 09/24/2014   Procedure: TRANSESOPHAGEAL ECHOCARDIOGRAM (TEE);  Surgeon: Colin Reichert, MD;  Location: [facility] ENDOSCOPY;  Service: Cardiovascular;  Laterality: N/A;    Current Medications: Current Meds  Medication Sig  . atorvastatin (LIPITOR) 40 MG tablet Take 40 mg by mouth daily.  Marland Kitchen CIALIS 20 MG tablet Take 1 tablet by mouth daily as needed.  . colchicine 0.6 MG tablet Take 0.6 mg by mouth daily.  Marland Kitchen ELIQUIS 5 MG TABS tablet TAKE 1 TABLET BY MOUTH TWICE A DAY  . JARDIANCE 10 MG TABS tablet Take 10 mg by mouth every morning.  Marland Kitchen levothyroxine (SYNTHROID, LEVOTHROID) 150 MCG tablet Take 150 mcg by mouth daily before breakfast.  . metoprolol tartrate (LOPRESSOR) 25 MG tablet TAKE 1 TABLET BY MOUTH TWICE A DAY  . Multiple Vitamins-Minerals (CENTRUM SILVER ADULT 50+ PO) Take 1 tablet by mouth daily.   Marland Kitchen NOVOTWIST 32G X 5 MM MISC See admin instructions.  . sotalol (BETAPACE) 80 MG tablet TAKE 1 TABLET BY MOUTH TWICE A DAY  . TRESIBA FLEXTOUCH 200 UNIT/ML SOPN INJECT 40 UNITS SUBCUTANEOUSLY AT BEDTIME  . Vitamin D, Ergocalciferol, (DRISDOL) 50000 units CAPS capsule TAKE ONE CAPSULE BY MOUTH ON TUESDAYS AND FRIDAYS     Allergies:   Patient has no known allergies.   Social History   Socioeconomic History  . Marital status: Married    Spouse name: Not  on file  . Number of children: Not on file  . Years of education: Not on file  . Highest education level: Not on file  Social Needs  . Financial resource strain: Not on file  . Food insecurity - worry: Not on file  . Food insecurity - inability: Not on file  . Transportation needs - medical: Not on file  . Transportation needs - non-medical: Not on file  Occupational History  . Not on file  Tobacco Use  . Smoking status: Former Games developer  . Smokeless tobacco: Never Used  Substance and Sexual Activity  . Alcohol use: No    Alcohol/week: 0.0 oz  . Drug use: No  . Sexual activity: Not on file  Other Topics Concern  . Not on file  Social History Narrative  . Not on file     Family History: The patient's family  history includes Heart Problems in his father and paternal grandfather; Stroke in his mother.  ROS:   Please see the history of present illness.    ROS  All other systems reviewed and negative.   EKGs/Labs/Other Studies Reviewed:    The following studies were reviewed today: PAP download  EKG:  EKG is  ordered today.  The ekg ordered today demonstrates coarse atrial fibrillation at 79bpm with LVH by voltage  Recent Labs: 11/22/2016: BUN 17; Creatinine, Ser 1.04; Hemoglobin 14.6; Platelets 176; Potassium 4.3; Sodium 140   Recent Lipid Panel No results found for: CHOL, TRIG, HDL, CHOLHDL, VLDL, LDLCALC, LDLDIRECT  Physical Exam:    VS:  BP 116/82   Pulse (!) 56   Ht 6' (1.829 m)   Wt 247 lb 3.2 oz (112.1 kg)   SpO2 96%   BMI 33.53 kg/m     Wt Readings from Last 3 Encounters:  07/09/17 247 lb 3.2 oz (112.1 kg)  11/22/16 250 lb 12.8 oz (113.8 kg)  08/02/16 247 lb (112 kg)     GEN:  Well nourished, well developed in no acute distress HEENT: Normal NECK: No JVD; No carotid bruits LYMPHATICS: No lymphadenopathy CARDIAC: irregularly irregular, no murmurs, rubs, gallops RESPIRATORY:  Clear to auscultation without rales, wheezing or rhonchi  ABDOMEN: Soft, non-tender, non-distended MUSCULOSKELETAL:  No edema; No deformity  SKIN: Warm and dry NEUROLOGIC:  Alert and oriented x 3 PSYCHIATRIC:  Normal affect   ASSESSMENT:    1. Persistent atrial fibrillation (HCC)   2. Dilated aortic root (HCC)   3. OSA on CPAP   4. Morbid obesity (HCC)   5. Benign essential HTN    PLAN:    In order of problems listed above:  1.  Persistent atrial fibrillation -  He is back in coarse atrial fibrillation with CVR.  He will continue on lopressor 25mg  BID and Sotolol 80mg  BID  and Eliquis 5mg  BID. He denies any recent bleeding problems. Creatinine was ormal at 1.02 on 04/2017.  I will check a Hbg since he is on a NOAC.  I am going to refer him to afib clinic for further evaulation.  I  encouraged him to be more compliant with his CPAP.  2.  Dilated aortic root/ascending aorta - 2D echo 06/2017 showed ascending aorta at 42mm and aortic root 40mm which is stable from 12/2016.  Continue aggressive BP control.   3.  OSA - the patient is tolerating PAP therapy well without any problems. The PAP download was reviewed today and showed an AHI of 1/hr on 9 cm H2O with 33% compliance in using  more than 4 hours nightly.  The patient has been using and benefiting from PAP use and will continue to benefit from therapy. I have encouraged him to be more compliant with his CPAP. THe main problems is he pulls the mask off in his sleep.    4.  Obesity - I have encouraged him to get into a routine exercise program and cut back on carbs and portions.   5.  HTN - BP is well controlled on exam today.  He will continue on lopressor 25mg  BID.     Medication Adjustments/Labs and Tests Ordered: Current medicines are reviewed at length with the patient today.  Concerns regarding medicines are outlined above.  No orders of the defined types were placed in this encounter.  No orders of the defined types were placed in this encounter.   Signed, Colin Magicraci Olis Viverette, MD  07/09/2017 10:35 AM    [facility]

## 2017-07-09 ENCOUNTER — Ambulatory Visit: Payer: PRIVATE HEALTH INSURANCE | Admitting: Cardiology

## 2017-07-09 VITALS — BP 116/82 | HR 56 | Ht 72.0 in | Wt 247.2 lb

## 2017-07-09 DIAGNOSIS — I77810 Thoracic aortic ectasia: Secondary | ICD-10-CM

## 2017-07-09 DIAGNOSIS — G4733 Obstructive sleep apnea (adult) (pediatric): Secondary | ICD-10-CM

## 2017-07-09 DIAGNOSIS — Z9989 Dependence on other enabling machines and devices: Secondary | ICD-10-CM

## 2017-07-09 DIAGNOSIS — I10 Essential (primary) hypertension: Secondary | ICD-10-CM

## 2017-07-09 DIAGNOSIS — I481 Persistent atrial fibrillation: Secondary | ICD-10-CM | POA: Diagnosis not present

## 2017-07-09 DIAGNOSIS — I4819 Other persistent atrial fibrillation: Secondary | ICD-10-CM

## 2017-07-09 NOTE — Patient Instructions (Signed)
Medication Instructions:  Your provider recommends that you continue on your current medications as directed. Please refer to the Current Medication list given to you today.    Labwork: TODAY: CBC  Testing/Procedures: None  Follow-Up: You have been referred to A FIB CLINIC.  Your provider wants you to follow-up in: 6 months with Dr. Mayford Knifeurner. You will receive a reminder letter in the mail two months in advance. If you don't receive a letter, please call our office to schedule the follow-up appointment.    Any Other Special Instructions Will Be Listed Below (If Applicable).     If you need a refill on your cardiac medications before your next appointment, please call your pharmacy.

## 2017-07-10 LAB — CBC WITH DIFFERENTIAL/PLATELET
Basophils Absolute: 0 10*3/uL (ref 0.0–0.2)
Basos: 0 %
EOS (ABSOLUTE): 0.1 10*3/uL (ref 0.0–0.4)
EOS: 1 %
HEMATOCRIT: 46.2 % (ref 37.5–51.0)
HEMOGLOBIN: 15.6 g/dL (ref 13.0–17.7)
Immature Grans (Abs): 0 10*3/uL (ref 0.0–0.1)
Immature Granulocytes: 0 %
LYMPHS ABS: 1.7 10*3/uL (ref 0.7–3.1)
Lymphs: 28 %
MCH: 28.1 pg (ref 26.6–33.0)
MCHC: 33.8 g/dL (ref 31.5–35.7)
MCV: 83 fL (ref 79–97)
MONOCYTES: 8 %
Monocytes Absolute: 0.5 10*3/uL (ref 0.1–0.9)
NEUTROS ABS: 3.7 10*3/uL (ref 1.4–7.0)
Neutrophils: 63 %
Platelets: 280 10*3/uL (ref 150–379)
RBC: 5.56 x10E6/uL (ref 4.14–5.80)
RDW: 15.3 % (ref 12.3–15.4)
WBC: 5.9 10*3/uL (ref 3.4–10.8)

## 2017-07-11 ENCOUNTER — Other Ambulatory Visit: Payer: Self-pay | Admitting: *Deleted

## 2017-07-11 MED ORDER — APIXABAN 5 MG PO TABS: 5.0000 mg | ORAL_TABLET | Freq: Two times a day (BID) | ORAL | 5 refills | Status: DC

## 2017-07-11 NOTE — Telephone Encounter (Signed)
Pt saw Dr Mayford Knifeurner on 07/09/17, Wt 112.1 Kg. SCr on 11/22/16 was 1.04. Age 62 yrs old. Will refill Eliquis 5mg  BID.

## 2017-07-15 ENCOUNTER — Encounter (HOSPITAL_COMMUNITY): Payer: Self-pay | Admitting: Nurse Practitioner

## 2017-07-15 ENCOUNTER — Ambulatory Visit (HOSPITAL_COMMUNITY)
Admission: RE | Admit: 2017-07-15 | Discharge: 2017-07-15 | Disposition: A | Discharge status: Home / Self Care | Payer: PRIVATE HEALTH INSURANCE | Source: Ambulatory Visit | Attending: Nurse Practitioner | Admitting: Nurse Practitioner

## 2017-07-15 VITALS — BP 134/82 | HR 82 | Ht 72.0 in | Wt 246.0 lb

## 2017-07-15 DIAGNOSIS — Z87891 Personal history of nicotine dependence: Secondary | ICD-10-CM | POA: Diagnosis not present

## 2017-07-15 DIAGNOSIS — I481 Persistent atrial fibrillation: Secondary | ICD-10-CM | POA: Diagnosis not present

## 2017-07-15 DIAGNOSIS — I10 Essential (primary) hypertension: Secondary | ICD-10-CM | POA: Insufficient documentation

## 2017-07-15 DIAGNOSIS — I420 Dilated cardiomyopathy: Secondary | ICD-10-CM | POA: Diagnosis not present

## 2017-07-15 DIAGNOSIS — I484 Atypical atrial flutter: Secondary | ICD-10-CM | POA: Diagnosis not present

## 2017-07-15 DIAGNOSIS — G4733 Obstructive sleep apnea (adult) (pediatric): Secondary | ICD-10-CM | POA: Diagnosis not present

## 2017-07-15 DIAGNOSIS — I4892 Unspecified atrial flutter: Secondary | ICD-10-CM | POA: Insufficient documentation

## 2017-07-15 DIAGNOSIS — Z79899 Other long term (current) drug therapy: Secondary | ICD-10-CM | POA: Diagnosis not present

## 2017-07-15 NOTE — H&P (View-Only) (Signed)
Primary Care Physician: Dorothyann PengSanders, Robyn, MD Referring Physician: Dr. Roger Shelterraci Turner   Colin [name] is a 62 y.o. male with a h/o atrial flutter/atrial  fibrillation, h/o DCM, but normalized with SR, OSA, treated. He was recently found to be out of SR, he was asymptomatic. He is in the afib clinic for f/u. Triggers discussed, he has moderate intake of caffeine and plans to reduce caffeine intake. Other than that, no significant triggers identified. He is still taking sotalol 80 mg bid, has been for over 2 years without any issues with fib/flutter. He did miss a dose of eliquis last week, pretty sure it was before 3/8.  Today, he denies symptoms of palpitations, chest pain, shortness of breath, orthopnea, PND, lower extremity edema, dizziness, presyncope, syncope, or neurologic sequela. The patient is tolerating medications without difficulties and is otherwise without complaint today.   Past Medical History:  Diagnosis Date  . Atrial flutter (HCC)   . DCM (dilated cardiomyopathy) (HCC)    EF 35-40% years ago but echo a year ago showed normal LVF  . Dilated aortic root (HCC) 04/15/2015   42mm by echo 06/2017 and Chest CT 12/2016  . Hypertension   . Obesities, morbid (HCC)   . OSA (obstructive sleep apnea)   . Persistent atrial fibrillation [facility](HCC)    s/p TEE/DCCV now on Sotolol   Past Surgical History:  Procedure Laterality Date  . CARDIOVERSION N/A 09/24/2014   Procedure: CARDIOVERSION;  Surgeon: Quintella Reichertraci R Turner, MD;  Location: [facility] END[facility];  Service: Cardiovascular;  Laterality: N/A;  . TEE WITHOUT CARDIOVERSION N/A 09/24/2014   Procedure: TRANSESOPHAGEAL ECHOCARDIOGRAM (TEE);  Surgeon: Quintella Reichertraci R Turner, MD;  [facility];  Service: Cardiovascular;  Laterality: N/A;    Current Outpatient Medications  Medication Sig Dispense Refill  . apixaban (ELIQUIS) 5 MG TABS tablet Take 1 tablet (5 mg total) by mouth 2 (two) times daily. 60 tablet 5  . atorvastatin (LIPITOR) 40 MG tablet Take  40 mg by mouth daily.    Marland Kitchen. CIALIS 20 MG tablet Take 1 tablet by mouth daily as needed.  3  . colchicine 0.6 MG tablet Take 0.6 mg by mouth daily.    Marland Kitchen. JARDIANCE 10 MG TABS tablet Take 10 mg by mouth every morning.  1  . levothyroxine (SYNTHROID, LEVOTHROID) 150 MCG tablet Take 150 mcg by mouth daily before breakfast.    . metoprolol tartrate (LOPRESSOR) 25 MG tablet TAKE 1 TABLET BY MOUTH TWICE A DAY 60 tablet 11  . Multiple Vitamins-Minerals (CENTRUM SILVER ADULT 50+ PO) Take 1 tablet by mouth daily.     Marland Kitchen. NOVOTWIST 32G X 5 MM MISC See admin instructions.  3  . sotalol (BETAPACE) 80 MG tablet TAKE 1 TABLET BY MOUTH TWICE A DAY 60 tablet 11  . TRESIBA FLEXTOUCH 200 UNIT/ML SOPN INJECT 40 UNITS SUBCUTANEOUSLY AT BEDTIME  1  . Vitamin D, Ergocalciferol, (DRISDOL) 50000 units CAPS capsule TAKE ONE CAPSULE BY MOUTH ON TUESDAYS AND FRIDAYS  0   No current facility-administered medications for this encounter.     No Known Allergies  Social History   Socioeconomic History  . Marital status: Married    Spouse name: Not on file  . Number of children: Not on file  . Years of education: Not on file  . Highest education level: Not on file  Social Needs  . Financial resource strain: Not on file  . Food insecurity - worry: Not on file  . Food insecurity - inability: Not on  file  . Transportation needs - medical: Not on file  . Transportation needs - non-medical: Not on file  Occupational History  . Not on file  Tobacco Use  . Smoking status: Former Games developer  . Smokeless tobacco: Never Used  Substance and Sexual Activity  . Alcohol use: No    Alcohol/week: 0.0 oz  . Drug use: No  . Sexual activity: Not on file  Other Topics Concern  . Not on file  Social History Narrative  . Not on file    Family History  Problem Relation Age of Onset  . Stroke Mother   . Heart Problems Father        weak heart  . Heart Problems Paternal Grandfather        pacemaker    ROS- All systems are  reviewed and negative except as per the HPI above  Physical Exam: Vitals:   07/15/17 1541  BP: 134/82  Pulse: 82  Weight: 246 lb (111.6 kg)  Height: 6' (1.829 m)   Wt Readings from Last 3 Encounters:  07/15/17 246 lb (111.6 kg)  07/09/17 247 lb 3.2 oz (112.1 kg)  11/22/16 250 lb 12.8 oz (113.8 kg)    Labs: Lab Results  Component Value Date   NA 140 11/22/2016   K 4.3 11/22/2016   CL 101 11/22/2016   CO2 21 11/22/2016   GLUCOSE 197 (H) 11/22/2016   BUN 17 11/22/2016   CREATININE 1.04 11/22/2016   CALCIUM 9.3 11/22/2016   No results found for: INR No results found for: CHOL, HDL, LDLCALC, TRIG   GEN- The patient is well appearing, alert and oriented x 3 today.   Head- normocephalic, atraumatic Eyes-  Sclera clear, conjunctiva pink Ears- hearing intact Oropharynx- clear Neck- supple, no JVP Lymph- no cervical lymphadenopathy Lungs- Clear to ausculation bilaterally, normal work of breathing Heart- irregular rate and rhythm, no murmurs, rubs or gallops, PMI not laterally displaced GI- soft, NT, ND, + BS Extremities- no clubbing, cyanosis, or edema MS- no significant deformity or atrophy Skin- no rash or lesion Psych- euthymic mood, full affect Neuro- strength and sensation are intact  EKG- atrial flutter with variable AV block, LAFB, Septal infarct,v rate 82 bpm, qrs int 86 ms, qtc 469 ms Echo-Study Conclusions  - Left ventricle: The cavity size was normal. There was mild   concentric hypertrophy. Systolic function was at the lower limits   of normal. The estimated ejection fraction was in the range of   50% to 55%. Wall motion was normal; there were no regional wall   motion abnormalities. - Aortic valve: There was mild regurgitation. - Mitral valve: Mildly calcified annulus. - Left atrium: The atrium was moderately dilated. - Right atrium: The atrium was mildly dilated.  Impressions:  - During the study, the rhythm appears to be atrial flutter with    variable AV block.   Assessment and Plan: 1. Atrial flutter with variable block Asymptomatic Rate controlled  Continue sotalol and metoprolol at current doses He missed a dose of eliquis before 3/8, reminded not to miss any further doses and will plan on cardioversion 3/29 He will cut back on caffeine Continue to treat sleep apnea IF he notices that he is not tolerating with weight gain, dyspnea, lightheadedness, then call office and will proceed with TEE guided cardioversion  F/u in 2 weeks  Lupita Leash C. [facility] [address] [place], [place] [address] [phone]

## 2017-07-15 NOTE — Progress Notes (Signed)
Primary Care Physician: Dorothyann PengSanders, Robyn, MD Referring Physician: Dr. Roger Shelterraci Turner   Colin [name] is a 62 y.o. male with a h/o atrial flutter/atrial  fibrillation, h/o DCM, but normalized with SR, OSA, treated. He was recently found to be out of SR, he was asymptomatic. He is in the afib clinic for f/u. Triggers discussed, he has moderate intake of caffeine and plans to reduce caffeine intake. Other than that, no significant triggers identified. He is still taking sotalol 80 mg bid, has been for over 2 years without any issues with fib/flutter. He did miss a dose of eliquis last week, pretty sure it was before 3/8.  Today, he denies symptoms of palpitations, chest pain, shortness of breath, orthopnea, PND, lower extremity edema, dizziness, presyncope, syncope, or neurologic sequela. The patient is tolerating medications without difficulties and is otherwise without complaint today.   Past Medical History:  Diagnosis Date  . Atrial flutter (HCC)   . DCM (dilated cardiomyopathy) (HCC)    EF 35-40% years ago but echo a year ago showed normal LVF  . Dilated aortic root (HCC) 04/15/2015   42mm by echo 06/2017 and Chest CT 12/2016  . Hypertension   . Obesities, morbid (HCC)   . OSA (obstructive sleep apnea)   . Persistent atrial fibrillation [facility](HCC)    s/p TEE/DCCV now on Sotolol   Past Surgical History:  Procedure Laterality Date  . CARDIOVERSION N/A 09/24/2014   Procedure: CARDIOVERSION;  Surgeon: Quintella Reichertraci R Turner, MD;  Location: [facility];  Service: Cardiovascular;  Laterality: N/A;  . TEE WITHOUT CARDIOVERSION N/A 09/24/2014   Procedure: TRANSESOPHAGEAL ECHOCARDIOGRAM (TEE);  Surgeon: Quintella Reichertraci R Turner, MD;  [facility];  Service: Cardiovascular;  Laterality: N/A;    Current Outpatient Medications  Medication Sig Dispense Refill  . apixaban (ELIQUIS) 5 MG TABS tablet Take 1 tablet (5 mg total) by mouth 2 (two) times daily. 60 tablet 5  . atorvastatin (LIPITOR) 40 MG tablet Take  40 mg by mouth daily.    Marland Kitchen. CIALIS 20 MG tablet Take 1 tablet by mouth daily as needed.  3  . colchicine 0.6 MG tablet Take 0.6 mg by mouth daily.    Marland Kitchen. JARDIANCE 10 MG TABS tablet Take 10 mg by mouth every morning.  1  . levothyroxine (SYNTHROID, LEVOTHROID) 150 MCG tablet Take 150 mcg by mouth daily before breakfast.    . metoprolol tartrate (LOPRESSOR) 25 MG tablet TAKE 1 TABLET BY MOUTH TWICE A DAY 60 tablet 11  . Multiple Vitamins-Minerals (CENTRUM SILVER ADULT 50+ PO) Take 1 tablet by mouth daily.     Marland Kitchen. NOVOTWIST 32G X 5 MM MISC See admin instructions.  3  . sotalol (BETAPACE) 80 MG tablet TAKE 1 TABLET BY MOUTH TWICE A DAY 60 tablet 11  . TRESIBA FLEXTOUCH 200 UNIT/ML SOPN INJECT 40 UNITS SUBCUTANEOUSLY AT BEDTIME  1  . Vitamin D, Ergocalciferol, (DRISDOL) 50000 units CAPS capsule TAKE ONE CAPSULE BY MOUTH ON TUESDAYS AND FRIDAYS  0   No current facility-administered medications for this encounter.     No Known Allergies  Social History   Socioeconomic History  . Marital status: Married    Spouse name: Not on file  . Number of children: Not on file  . Years of education: Not on file  . Highest education level: Not on file  Social Needs  . Financial resource strain: Not on file  . Food insecurity - worry: Not on file  . Food insecurity - inability: Not on  file  . Transportation needs - medical: Not on file  . Transportation needs - non-medical: Not on file  Occupational History  . Not on file  Tobacco Use  . Smoking status: Former Games developer  . Smokeless tobacco: Never Used  Substance and Sexual Activity  . Alcohol use: No    Alcohol/week: 0.0 oz  . Drug use: No  . Sexual activity: Not on file  Other Topics Concern  . Not on file  Social History Narrative  . Not on file    Family History  Problem Relation Age of Onset  . Stroke Mother   . Heart Problems Father        weak heart  . Heart Problems Paternal Grandfather        pacemaker    ROS- All systems are  reviewed and negative except as per the HPI above  Physical Exam: Vitals:   07/15/17 1541  BP: 134/82  Pulse: 82  Weight: 246 lb (111.6 kg)  Height: 6' (1.829 m)   Wt Readings from Last 3 Encounters:  07/15/17 246 lb (111.6 kg)  07/09/17 247 lb 3.2 oz (112.1 kg)  11/22/16 250 lb 12.8 oz (113.8 kg)    Labs: Lab Results  Component Value Date   NA 140 11/22/2016   K 4.3 11/22/2016   CL 101 11/22/2016   CO2 21 11/22/2016   GLUCOSE 197 (H) 11/22/2016   BUN 17 11/22/2016   CREATININE 1.04 11/22/2016   CALCIUM 9.3 11/22/2016   No results found for: INR No results found for: CHOL, HDL, LDLCALC, TRIG   GEN- The patient is well appearing, alert and oriented x 3 today.   Head- normocephalic, atraumatic Eyes-  Sclera clear, conjunctiva pink Ears- hearing intact Oropharynx- clear Neck- supple, no JVP Lymph- no cervical lymphadenopathy Lungs- Clear to ausculation bilaterally, normal work of breathing Heart- irregular rate and rhythm, no murmurs, rubs or gallops, PMI not laterally displaced GI- soft, NT, ND, + BS Extremities- no clubbing, cyanosis, or edema MS- no significant deformity or atrophy Skin- no rash or lesion Psych- euthymic mood, full affect Neuro- strength and sensation are intact  EKG- atrial flutter with variable AV block, LAFB, Septal infarct,v rate 82 bpm, qrs int 86 ms, qtc 469 ms Echo-Study Conclusions  - Left ventricle: The cavity size was normal. There was mild   concentric hypertrophy. Systolic function was at the lower limits   of normal. The estimated ejection fraction was in the range of   50% to 55%. Wall motion was normal; there were no regional wall   motion abnormalities. - Aortic valve: There was mild regurgitation. - Mitral valve: Mildly calcified annulus. - Left atrium: The atrium was moderately dilated. - Right atrium: The atrium was mildly dilated.  Impressions:  - During the study, the rhythm appears to be atrial flutter with    variable AV block.   Assessment and Plan: 1. Atrial flutter with variable block Asymptomatic Rate controlled  Continue sotalol and metoprolol at current doses He missed a dose of eliquis before 3/8, reminded not to miss any further doses and will plan on cardioversion 3/29 He will cut back on caffeine Continue to treat sleep apnea IF he notices that he is not tolerating with weight gain, dyspnea, lightheadedness, then call office and will proceed with TEE guided cardioversion  F/u in 2 weeks  Lupita Leash C. [facility] [address] [place], [place] [address] [phone]

## 2017-07-19 ENCOUNTER — Other Ambulatory Visit (HOSPITAL_COMMUNITY): Payer: Self-pay | Admitting: *Deleted

## 2017-07-19 ENCOUNTER — Encounter (HOSPITAL_COMMUNITY): Payer: Self-pay | Admitting: *Deleted

## 2017-08-01 ENCOUNTER — Encounter (HOSPITAL_COMMUNITY): Payer: Self-pay | Admitting: Nurse Practitioner

## 2017-08-01 ENCOUNTER — Ambulatory Visit (HOSPITAL_COMMUNITY): Payer: PRIVATE HEALTH INSURANCE | Admitting: Nurse Practitioner

## 2017-08-01 ENCOUNTER — Ambulatory Visit (HOSPITAL_COMMUNITY)
Admission: RE | Admit: 2017-08-01 | Discharge: 2017-08-01 | Disposition: A | Discharge status: Home / Self Care | Payer: PRIVATE HEALTH INSURANCE | Source: Ambulatory Visit | Attending: Nurse Practitioner | Admitting: Nurse Practitioner

## 2017-08-01 DIAGNOSIS — I444 Left anterior fascicular block: Secondary | ICD-10-CM | POA: Insufficient documentation

## 2017-08-01 DIAGNOSIS — I4892 Unspecified atrial flutter: Secondary | ICD-10-CM | POA: Diagnosis not present

## 2017-08-01 DIAGNOSIS — I4439 Other atrioventricular block: Secondary | ICD-10-CM | POA: Diagnosis not present

## 2017-08-01 LAB — BASIC METABOLIC PANEL
Anion gap: 10 (ref 5–15)
BUN: 18 mg/dL (ref 6–20)
CALCIUM: 8.9 mg/dL (ref 8.9–10.3)
CO2: 22 mmol/L (ref 22–32)
Chloride: 106 mmol/L (ref 101–111)
Creatinine, Ser: 1.17 mg/dL (ref 0.61–1.24)
GFR calc Af Amer: >60 mL/min (ref >60)
GFR calc non Af Amer: >60 mL/min (ref >60)
Glucose, Bld: 214 mg/dL — ABNORMAL HIGH (ref 65–99)
Potassium: 4.6 mmol/L (ref 3.5–5.1)
Sodium: 138 mmol/L (ref 135–145)

## 2017-08-01 LAB — CBC
HCT: 44.8 % (ref 39.0–52.0)
Hemoglobin: 14.7 g/dL (ref 13.0–17.0)
MCH: 27.1 pg (ref 26.0–34.0)
MCHC: 32.8 g/dL (ref 30.0–36.0)
MCV: 82.5 fL (ref 78.0–100.0)
PLATELETS: 261 10*3/uL (ref 150–400)
RBC: 5.43 MIL/uL (ref 4.22–5.81)
RDW: 14.2 % (ref 11.5–15.5)
WBC: 6.2 10*3/uL (ref 4.0–10.5)

## 2017-08-01 NOTE — Progress Notes (Addendum)
Pt in for pre dccv labs and EKG. To be reviewed by Rudi Cocoonna Carroll, NP  Pt remains in atrial flutter at 85 bpm. No  missed dosed of anticoagulation x 3 weeks, Pt is scheduled for cardioversion 3/29.

## 2017-08-01 NOTE — Patient Instructions (Signed)
Cardioversion scheduled for Friday, March 29th  - Arrive at the [facility]tr[facility] and go to admitting at 10:30AM  -Do not eat or drink anything after midnight the night prior to your procedure.  - Take all your medication with a sip of water prior to arrival.  - You will not be able to drive home after your procedure.

## 2017-08-02 ENCOUNTER — Encounter: Payer: Self-pay | Admitting: Neurology

## 2017-08-02 ENCOUNTER — Ambulatory Visit (INDEPENDENT_AMBULATORY_CARE_PROVIDER_SITE_OTHER): Payer: PRIVATE HEALTH INSURANCE | Admitting: Neurology

## 2017-08-02 ENCOUNTER — Ambulatory Visit (HOSPITAL_COMMUNITY): Payer: PRIVATE HEALTH INSURANCE | Admitting: Anesthesiology

## 2017-08-02 ENCOUNTER — Encounter (HOSPITAL_COMMUNITY)
Admission: RE | Disposition: A | Discharge status: Home / Self Care | Payer: Self-pay | Source: Ambulatory Visit | Attending: Internal Medicine

## 2017-08-02 ENCOUNTER — Ambulatory Visit (HOSPITAL_COMMUNITY): Payer: PRIVATE HEALTH INSURANCE | Admitting: Nurse Practitioner

## 2017-08-02 ENCOUNTER — Other Ambulatory Visit: Payer: Self-pay

## 2017-08-02 ENCOUNTER — Ambulatory Visit (HOSPITAL_COMMUNITY)
Admission: RE | Admit: 2017-08-02 | Discharge: 2017-08-02 | Disposition: A | Discharge status: Home / Self Care | Payer: PRIVATE HEALTH INSURANCE | Source: Ambulatory Visit | Attending: Internal Medicine | Admitting: Internal Medicine

## 2017-08-02 ENCOUNTER — Encounter (HOSPITAL_COMMUNITY): Payer: Self-pay | Admitting: *Deleted

## 2017-08-02 VITALS — BP 126/81 | Resp 18 | Ht 72.0 in | Wt 247.5 lb

## 2017-08-02 DIAGNOSIS — I4891 Unspecified atrial fibrillation: Secondary | ICD-10-CM

## 2017-08-02 DIAGNOSIS — I481 Persistent atrial fibrillation: Secondary | ICD-10-CM

## 2017-08-02 DIAGNOSIS — Z794 Long term (current) use of insulin: Secondary | ICD-10-CM

## 2017-08-02 DIAGNOSIS — Z87891 Personal history of nicotine dependence: Secondary | ICD-10-CM | POA: Diagnosis not present

## 2017-08-02 DIAGNOSIS — I10 Essential (primary) hypertension: Secondary | ICD-10-CM | POA: Diagnosis not present

## 2017-08-02 DIAGNOSIS — I639 Cerebral infarction, unspecified: Secondary | ICD-10-CM | POA: Diagnosis not present

## 2017-08-02 DIAGNOSIS — E119 Type 2 diabetes mellitus without complications: Secondary | ICD-10-CM

## 2017-08-02 DIAGNOSIS — I420 Dilated cardiomyopathy: Secondary | ICD-10-CM | POA: Diagnosis not present

## 2017-08-02 DIAGNOSIS — Z7902 Long term (current) use of antithrombotics/antiplatelets: Secondary | ICD-10-CM | POA: Insufficient documentation

## 2017-08-02 DIAGNOSIS — H53462 Homonymous bilateral field defects, left side: Secondary | ICD-10-CM | POA: Diagnosis not present

## 2017-08-02 DIAGNOSIS — Z79899 Other long term (current) drug therapy: Secondary | ICD-10-CM | POA: Diagnosis not present

## 2017-08-02 DIAGNOSIS — G4733 Obstructive sleep apnea (adult) (pediatric): Secondary | ICD-10-CM | POA: Diagnosis not present

## 2017-08-02 DIAGNOSIS — I4819 Other persistent atrial fibrillation: Secondary | ICD-10-CM

## 2017-08-02 DIAGNOSIS — H5340 Unspecified visual field defects: Secondary | ICD-10-CM | POA: Diagnosis not present

## 2017-08-02 DIAGNOSIS — I4892 Unspecified atrial flutter: Secondary | ICD-10-CM | POA: Insufficient documentation

## 2017-08-02 DIAGNOSIS — Z9989 Dependence on other enabling machines and devices: Secondary | ICD-10-CM | POA: Diagnosis not present

## 2017-08-02 HISTORY — PX: CARDIOVERSION: SHX1299

## 2017-08-02 LAB — GLUCOSE, CAPILLARY: Glucose-Capillary: 155 mg/dL — ABNORMAL HIGH (ref 65–99)

## 2017-08-02 SURGERY — CARDIOVERSION
Anesthesia: General

## 2017-08-02 MED ORDER — SODIUM CHLORIDE 0.9 % IV SOLN
INTRAVENOUS | Status: DC | PRN
  Administered 2017-08-02: 11:00:00 via INTRAVENOUS

## 2017-08-02 MED ORDER — PROPOFOL 10 MG/ML IV BOLUS
INTRAVENOUS | Status: DC | PRN
  Administered 2017-08-02: 50 mg via INTRAVENOUS

## 2017-08-02 MED ORDER — SOTALOL HCL 120 MG PO TABS: 120.0000 mg | ORAL_TABLET | Freq: Two times a day (BID) | ORAL | 3 refills | Status: DC

## 2017-08-02 MED ORDER — LIDOCAINE 2% (20 MG/ML) 5 ML SYRINGE
INTRAMUSCULAR | Status: DC | PRN
  Administered 2017-08-02: 40 mg via INTRAVENOUS

## 2017-08-02 NOTE — Anesthesia Procedure Notes (Signed)
Procedure Name: General with mask airway Date/Time: 08/02/2017 10:33 AM Performed by: Elliot DallyHuggins, Cindia Hustead, CRNA Pre-anesthesia Checklist: Patient identified, Emergency Drugs available, Suction available and Patient being monitored Patient Re-evaluated:Patient Re-evaluated prior to induction Oxygen Delivery Method: Ambu bag Preoxygenation: Pre-oxygenation with 100% oxygen Induction Type: IV induction

## 2017-08-02 NOTE — Transfer of Care (Signed)
Immediate Anesthesia Transfer of Care Note  Patient: Colin [name]  Procedure(s) Performed: CARDIOVERSION (N/A )  Patient Location: Endoscopy Unit  Anesthesia Type:General  Level of Consciousness: drowsy  Airway & Oxygen Therapy: Patient Spontanous Breathing and Patient connected to nasal cannula oxygen  Post-op Assessment: Report given to RN and Post -op Vital signs reviewed and stable  Post vital signs: Reviewed and stable  Last Vitals:  Vitals Value Taken Time  BP    Temp    Pulse 70 08/02/2017 10:43 AM  Resp 19 08/02/2017 10:43 AM  SpO2 95 % 08/02/2017 10:43 AM  Vitals shown include unvalidated device data.  Last Pain:  Vitals:   08/02/17 0952  TempSrc: Oral  PainSc: 0-No pain         Complications: No apparent anesthesia complications

## 2017-08-02 NOTE — Anesthesia Preprocedure Evaluation (Addendum)
Anesthesia Evaluation  Patient identified by MRN, date of birth, ID band Patient awake    Reviewed: Allergy & Precautions, NPO status   Airway Mallampati: II  TM Distance: >3 FB Neck ROM: Full    Dental no notable dental hx. (+) Teeth Intact   Pulmonary sleep apnea , former smoker,    Pulmonary exam normal breath sounds clear to auscultation       Cardiovascular hypertension, Pt. on medications and Pt. on home beta blockers + Peripheral Vascular Disease  + dysrhythmias Atrial Fibrillation  Rhythm:Irregular Rate:Normal  Dilated aortic root Dilated Cardiomyopathy LVEF35-40% but on most recent Echo 06/24/2017 LVEF improved to 50-55%   Neuro/Psych Bilateral homonymous hemianopia CVA, Residual Symptoms negative psych ROS   GI/Hepatic negative GI ROS, Neg liver ROS,   Endo/Other  diabetes, Poorly Controlled, Type 2, Oral Hypoglycemic AgentsHyperlipidemia Obesity  Renal/GU negative Renal ROS  negative genitourinary   Musculoskeletal  (+) Arthritis , Hx/o gout   Abdominal (+) + obese,   Peds  Hematology Eliquis- last dose this am   Anesthesia Other Findings   Reproductive/Obstetrics                            Anesthesia Physical Anesthesia Plan  ASA: III  Anesthesia Plan: General   Post-op Pain Management:    Induction: Intravenous  PONV Risk Score and Plan: Propofol infusion, Ondansetron and Treatment may vary due to age or medical condition  Airway Management Planned: Mask  Additional Equipment:   Intra-op Plan:   Post-operative Plan:   Informed Consent: I have reviewed the patients History and Physical, chart, labs and discussed the procedure including the risks, benefits and alternatives for the proposed anesthesia with the patient or authorized representative who has indicated his/her understanding and acceptance.   Dental advisory given  Plan Discussed with: CRNA,  Anesthesiologist and Surgeon  Anesthesia Plan Comments:         Anesthesia Quick Evaluation

## 2017-08-02 NOTE — CV Procedure (Signed)
Cardioversion  Patient anesthetized by anesthesia with lidocaine and Propofol intravenously  With pads in AP position, patient cardioverted to SR with 200 J synchronized biphasic energy.   In a few seconds he reverted to atrial fibrillation  Second attempt made with 200 J synchronized biphasic energy. Patient cardioverted to SR again for just a few seconds then reverted to atrial fibrillation.     Procedure was without complications

## 2017-08-02 NOTE — Progress Notes (Signed)
Attempt at cardioversion unsuccessful  Patient converted but did not maintain SR REviewed with T. Turner. REcomm increase Sotalol to 120 mg bid    Attempt cardioversion on higher dose on Tuesday April 2nd  Continue other meds

## 2017-08-02 NOTE — Discharge Instructions (Signed)
Electrical Cardioversion, Care After °This sheet gives you information about how to care for yourself after your procedure. Your health care provider may also give you more specific instructions. If you have problems or questions, contact your health care provider. °What can I expect after the procedure? °After the procedure, it is common to have: °· Some redness on the skin where the shocks were given. ° °Follow these instructions at home: °· Do not drive for 24 hours if you were given a medicine to help you relax (sedative). °· Take over-the-counter and prescription medicines only as told by your health care provider. °· Ask your health care provider how to check your pulse. Check it often. °· Rest for 48 hours after the procedure or as told by your health care provider. °· Avoid or limit your caffeine use as told by your health care provider. °Contact a health care provider if: °· You feel like your heart is beating too quickly or your pulse is not regular. °· You have a serious muscle cramp that does not go away. °Get help right away if: °· You have discomfort in your chest. °· You are dizzy or you feel faint. °· You have trouble breathing or you are short of breath. °· Your speech is slurred. °· You have trouble moving an arm or leg on one side of your body. °· Your fingers or toes turn cold or blue. °This information is not intended to replace advice given to you by your health care provider. Make sure you discuss any questions you have with your health care provider. °Document Released: 02/11/2013 Document Revised: 11/25/2015 Document Reviewed: 10/28/2015 °Elsevier Interactive Patient Education © 2018 [employer] ° °

## 2017-08-02 NOTE — Anesthesia Postprocedure Evaluation (Signed)
Anesthesia Post Note  Patient: Calloway E Vanhandel  Procedure(s) Performed: CARDIOVERSION (N/A )     Patient location during evaluation: PACU Anesthesia Type: General Level of consciousness: awake and alert and oriented Pain management: pain level controlled Vital Signs Assessment: post-procedure vital signs reviewed and stable Respiratory status: spontaneous breathing, nonlabored ventilation and respiratory function stable Cardiovascular status: blood pressure returned to baseline and stable Postop Assessment: no apparent nausea or vomiting Anesthetic complications: no    Last Vitals:  Vitals:   08/02/17 1100 08/02/17 1110  BP: 122/78 131/80  Pulse: 67 86  Resp: (!) 36 19  Temp:    SpO2: 96% 97%    Last Pain:  Vitals:   08/02/17 1110  TempSrc:   PainSc: 0-No pain                 Kendry Pfarr A.

## 2017-08-02 NOTE — Progress Notes (Signed)
[facility]  PATIENT: Colin [name] DOB: 05/20/1955  REFERRING DOCTOR OR PCP:  Dorothyann Peng (PCP),  Dr. Santiago Bumpers (ophth) referred (fax [phone]) SOURCE: patient, notes from Dr. Marti Sleigh OD, notes from cardiology and sleep, sleep study, echo report      _________________________________   HISTORICAL  CHIEF COMPLAINT:  Chief Complaint  Patient presents with  . Hx. of CVA    Denies new stroke sx. and reports compliance with Eliquis. He is having a planned cardioversion (A-Fib) today at [facility]. Reports vision is left eye is some better but not back to baseline.  Reports compliance with CPAP./fim  . Decreased Visual Acuity  . Sleep Apnea    HISTORY OF PRESENT ILLNESS:  Update 08/02/2017:  He is a 62 yo man who had a stroke in late January 2018 and has a persistent left partial inferior quadrantanopsia.   MR angiogram showed occlusion of the left PCA but was otherwise normal.  MRA of the neck was normal.  Of note, he was in A. fib and was noncompliant with CPAP at the time of the stroke.    He is on Eliquis and tolerates it well.   He is goig to have the Afib cardioverted later today.   He is on sotalol and metoprolol.     He denies any new TIA or stroke symptoms.  Specifically no new visual changes, weakness, speech changes, numbness, clumsiness.    He feels that the vision improved partially after the stroke and over the next couple months.  However, there has been no further improvement over the past 6 months.  Stroke risk factors: His main risk factor is atrial fibrillation.  He has had some difficulty getting his diabetes under control.  His last HgbA1c was 9.3.    He uses CPAP nightly (Traci Turner follows)  From 07/12/2016: Colin [name] Is a 61 year old man who had the onset of left inferior quadrantanopsia in late January 2018.    MRI 3/102018 shows a subacute stroke in the right parieto-occipital region that could explain his visual field defect.   MR  angiogram showed mild left PCA stenosis but no stenosis in the right PCA or other arteries. MRA of the neck was essentially normal.   Since his last visit with me about 6 weeks ago, he feels that the vision has improved in the left lower visual field. He still sees better on the right lower that he does in the left lower but is able to count fingers and discern features of objects in that field.  He denies any numbness, weakness, change in gait or balance. There is no double vision.  Stoke risk factors include AFib (now in NSR), OSA (compliant with CPAP now), dilated CHF with low EF%, Insulin dependent Type II DM (HbgA1c has been around 9 but FSG typically around 150)   His mother had strokes and died in 12 at age 1.  Cardiac:  In the past, he had a myocardial infarction and a stent was placed by cardiology.   He was found to have paroxysmal atrial fibrillation and flutter and had a radiofrequency ablation. He has chronic systolic congestive heart failure.  An echocardiogram showed a dilated cardiomyopathy with an ejection fraction 35-40%.   More recent EF% was 60%  OSA:   He has OSA and is on CPAP. A sleep study dated 04/28/2012 showed an AHI equals 14.7 with oxygen desaturations to 84%. He was titrated to a CPAP pressure of 15 cm.  initially, compliance  was not good. He was reevaluated after his cardiac issues. He was found to have moderate OSA with an AHI equals 15.1 on PSG 01/07/2015 with desaturation to 85%.Marland Kitchen He was titrated to centimeters H2O (sees Armanda Magic).  Stroke History:   He was at a church meeting in late January 2018.  He was walking with his pastor who was on his left side.   Suddenly he lost peripheral vision to the left and couldn't see him.   At the time, he had a dull ache over the right eye. He did not completely lose vision to the left as he could still make out movements.   He feels the vision is unchanged or mildly better compared to 2 weeks ago.   He notes that the lower left  visual loss is worse than the upper left visual changes.     He denies numbness, weakness, clumsiness.   He denies diplopia.  He denis left eye pain but feels a Wandler puffy on that side.     He saw Dr. Santiago Bumpers (optometry).  I reviewed the visit notes and ophthalmoscopy showed no papilledema.   He had mild HTN changes bilaterally. Humphrey visual field testing was outside normal limits c/w left inferior quadrantopsia bilaterally.   MRI 07/14/2016 showed a subacute stroke in the right parietal occipital region. No significant arterial stenosis is noted in the neck. The left PCA had mild stenosis but the right PCA and other arteries were normal.  REVIEW OF SYSTEMS: Constitutional: No fevers, chills, sweats, or change in appetite Eyes: No visual changes, double vision, eye pain Ear, nose and throat: No hearing loss, ear pain, nasal congestion, sore throat Cardiovascular: No chest pain, palpitations Respiratory: No shortness of breath at rest or with exertion.   No wheezes GastrointestinaI: No nausea, vomiting, diarrhea, abdominal pain, fecal incontinence Genitourinary: No dysuria, urinary retention or frequency.  No nocturia. Musculoskeletal: No neck pain, back pain Integumentary: No rash, pruritus, skin lesions Neurological: as above Psychiatric: No depression at this time.  No anxiety Endocrine: No palpitations, diaphoresis, change in appetite, change in weigh or increased thirst Hematologic/Lymphatic: No anemia, purpura, petechiae. Allergic/Immunologic: No itchy/runny eyes, nasal congestion, recent allergic reactions, rashes  ALLERGIES: No Known Allergies  HOME MEDICATIONS:  Current Outpatient Medications:  .  apixaban (ELIQUIS) 5 MG TABS tablet, Take 1 tablet (5 mg total) by mouth 2 (two) times daily., Disp: 60 tablet, Rfl: 5 .  atorvastatin (LIPITOR) 40 MG tablet, Take 40 mg by mouth daily., Disp: , Rfl:  .  CIALIS 20 MG tablet, Take 1 tablet by mouth daily as needed., Disp: , Rfl:  3 .  colchicine 0.6 MG tablet, Take 0.6 mg by mouth daily as needed. , Disp: , Rfl:  .  JARDIANCE 10 MG TABS tablet, Take 10 mg by mouth every morning., Disp: , Rfl: 1 .  levothyroxine (SYNTHROID, LEVOTHROID) 150 MCG tablet, Take 150 mcg by mouth daily before breakfast., Disp: , Rfl:  .  metoprolol tartrate (LOPRESSOR) 25 MG tablet, TAKE 1 TABLET BY MOUTH TWICE A DAY, Disp: 60 tablet, Rfl: 11 .  Multiple Vitamins-Minerals (CENTRUM SILVER ADULT 50+ PO), Take 1 tablet by mouth daily. , Disp: , Rfl:  .  NOVOTWIST 32G X 5 MM MISC, See admin instructions., Disp: , Rfl: 3 .  nystatin cream (MYCOSTATIN), Apply 1 application topically 2 (two) times daily., Disp: , Rfl:  .  sotalol (BETAPACE) 80 MG tablet, TAKE 1 TABLET BY MOUTH TWICE A DAY, Disp: 60 tablet, Rfl: 11 .  TRESIBA FLEXTOUCH 200 UNIT/ML SOPN, INJECT 40 UNITS SUBCUTANEOUSLY AT BEDTIME, Disp: , Rfl: 1 .  Vitamin D, Ergocalciferol, (DRISDOL) 50000 units CAPS capsule, TAKE ONE CAPSULE BY MOUTH ON TUESDAYS AND FRIDAYS, Disp: , Rfl: 0  PAST MEDICAL HISTORY: Past Medical History:  Diagnosis Date  . Atrial flutter (HCC)   . DCM (dilated cardiomyopathy) (HCC)    EF 35-40% years ago but echo a year ago showed normal LVF  . Dilated aortic root (HCC) 04/15/2015   42mm by echo 06/2017 and Chest CT 12/2016  . Hypertension   . Obesities, morbid (HCC)   . OSA (obstructive sleep apnea)   . Persistent atrial fibrillation (HCC)    s/p TEE/DCCV now on Sotolol    PAST SURGICAL HISTORY: Past Surgical History:  Procedure Laterality Date  . CARDIOVERSION N/A 09/24/2014   Procedure: CARDIOVERSION;  Surgeon: Quintella Reichertraci R Turner, MD;  Location: [facility] ENDOSCOPY;  Service: Cardiovascular;  Laterality: N/A;  . TEE WITHOUT CARDIOVERSION N/A 09/24/2014   Procedure: TRANSESOPHAGEAL ECHOCARDIOGRAM (TEE);  Surgeon: Quintella Reichertraci R Turner, MD;  [facility];  Service: Cardiovascular;  Laterality: N/A;    FAMILY HISTORY: Family History  Problem Relation Age of Onset  .  Stroke Mother   . Heart Problems Father        weak heart  . Heart Problems Paternal Grandfather        pacemaker    SOCIAL HISTORY:  Social History   Socioeconomic History  . Marital status: Married    Spouse name: Not on file  . Number of children: Not on file  . Years of education: Not on file  . Highest education level: Not on file  Occupational History  . Not on file  Social Needs  . Financial resource strain: Not on file  . Food insecurity:    Worry: Not on file    Inability: Not on file  . Transportation needs:    Medical: Not on file    Non-medical: Not on file  Tobacco Use  . Smoking status: Former Games developermoker  . Smokeless tobacco: Never Used  Substance and Sexual Activity  . Alcohol use: No    Alcohol/week: 0.0 oz  . Drug use: No  . Sexual activity: Not on file  Lifestyle  . Physical activity:    Days per week: Not on file    Minutes per session: Not on file  . Stress: Not on file  Relationships  . Social connections:    Talks on phone: Not on file    Gets together: Not on file    Attends religious service: Not on file    Active member of club or organization: Not on file    Attends meetings of clubs or organizations: Not on file    Relationship status: Not on file  . Intimate partner violence:    Fear of current or ex partner: Not on file    Emotionally abused: Not on file    Physically abused: Not on file    Forced sexual activity: Not on file  Other Topics Concern  . Not on file  Social History Narrative  . Not on file     PHYSICAL EXAM  Vitals:   08/02/17 0821  BP: 126/81  Resp: 18  Weight: 247 lb 8 oz (112.3 kg)  Height: 6' (1.829 m)    Body mass index is 33.57 kg/m.   General: The patient is well-developed and well-nourished and in no acute distress   Cardiovascular: The heart has a regular  rate and rhythm with a normal S1 and S2. There were no murmurs, gallops or rubs.   Skin: Extremities are without significant  edema.   Neurologic Exam  Mental status: The patient is alert and oriented x 3 at the time of the examination. The patient has apparent normal recent and remote memory, with an apparently normal attention span and concentration ability.   Speech is normal.  Cranial nerves: Extraocular muscles are normal.  Pupils reactive to light and accommodation.  With visual field testing, there was partial lower left quadrantanopsia, more noticeable on the left than the right.  Facial strength and sensation was normal.  Trapezius strength was strong..  The tongue is midline, and the patient has symmetric elevation of the soft palate. No obvious hearing deficits are noted.  Motor:  Muscle bulk is normal.   Tone is normal. Strength is  5 / 5 in all 4 extremities.   Sensory: Sensory testing is intact to pinprick, soft touch and vibration sensation in all 4 extremities.  Coordination: Cerebellar testing reveals good finger-nose-finger bilaterally.  Gait and station: Station is normal.  The gait is normal.  Tandem gait is normal.  Romberg is negative  Reflexes: Deep tendon reflexes are symmetric and normal bilaterally.        DIAGNOSTIC DATA (LABS, IMAGING, TESTING) - I reviewed patient records, labs, notes, testing and imaging myself where available.  Lab Results  Component Value Date   WBC 6.2 08/01/2017   HGB 14.7 08/01/2017   HCT 44.8 08/01/2017   MCV 82.5 08/01/2017   PLT 261 08/01/2017      Component Value Date/Time   NA 138 08/01/2017 0930   NA 140 11/22/2016 1532   K 4.6 08/01/2017 0930   CL 106 08/01/2017 0930   CO2 22 08/01/2017 0930   GLUCOSE 214 (H) 08/01/2017 0930   BUN 18 08/01/2017 0930   BUN 17 11/22/2016 1532   CREATININE 1.17 08/01/2017 0930   CREATININE 1.19 04/15/2015 1608   CALCIUM 8.9 08/01/2017 0930   GFRNONAA >60 08/01/2017 0930   GFRAA >60 08/01/2017 0930       ASSESSMENT AND PLAN  Persistent atrial fibrillation (HCC)  Cerebrovascular accident (CVA),  unspecified mechanism (HCC)  OSA on CPAP  Visual field loss  Homonymous bilateral field defects in visual field, left   1.     He is going to be cardioverted today. 2.  We discussed the importance of staying on CPAP nightly and when he has planned naps to help to reduce the risk of reversion to A. fib and stroke.   3.   Continue Eliquis. If this is discontinued, he would need to go on Plavix and aspirin. He will return to see me as needed if he has any new or worsening neurologic symptoms.  Chakira Jachim A. Epimenio Foot, MD, PhD 08/02/2017, 8:41 AM Certified in Neurology, Clinical Neurophysiology, Sleep Medicine, Pain Medicine and Neuroimaging  [facility] [address], [address] [place], [place] [address] [phone]

## 2017-08-02 NOTE — Interval H&P Note (Signed)
History and Physical Interval Note:  08/02/2017 9:49 AM  Colin [name]  has presented today for surgery, with the diagnosis of afib  The various methods of treatment have been discussed with the patient and family. After consideration of risks, benefits and other options for treatment, the patient has consented to  Procedure(s): CARDIOVERSION (N/A) as a surgical intervention .  The patient's history has been reviewed, patient examined, no change in status, stable for surgery.  I have reviewed the patient's chart and labs.  Questions were answered to the patient's satisfaction.     Dietrich PatesPaula Sendy Pluta

## 2017-08-04 ENCOUNTER — Encounter (HOSPITAL_COMMUNITY): Payer: Self-pay | Admitting: Internal Medicine

## 2017-08-06 ENCOUNTER — Encounter (HOSPITAL_COMMUNITY)
Admission: RE | Disposition: A | Discharge status: Home / Self Care | Payer: Self-pay | Source: Ambulatory Visit | Attending: Cardiology

## 2017-08-06 ENCOUNTER — Ambulatory Visit (HOSPITAL_COMMUNITY)
Admission: RE | Admit: 2017-08-06 | Discharge: 2017-08-06 | Disposition: A | Discharge status: Home / Self Care | Payer: PRIVATE HEALTH INSURANCE | Source: Ambulatory Visit | Attending: Cardiology | Admitting: Cardiology

## 2017-08-06 DIAGNOSIS — I77819 Aortic ectasia, unspecified site: Secondary | ICD-10-CM | POA: Insufficient documentation

## 2017-08-06 DIAGNOSIS — I4892 Unspecified atrial flutter: Secondary | ICD-10-CM | POA: Insufficient documentation

## 2017-08-06 DIAGNOSIS — Z87891 Personal history of nicotine dependence: Secondary | ICD-10-CM | POA: Insufficient documentation

## 2017-08-06 DIAGNOSIS — I4430 Unspecified atrioventricular block: Secondary | ICD-10-CM | POA: Insufficient documentation

## 2017-08-06 DIAGNOSIS — I444 Left anterior fascicular block: Secondary | ICD-10-CM | POA: Diagnosis not present

## 2017-08-06 DIAGNOSIS — Z7901 Long term (current) use of anticoagulants: Secondary | ICD-10-CM | POA: Diagnosis not present

## 2017-08-06 DIAGNOSIS — I480 Paroxysmal atrial fibrillation: Secondary | ICD-10-CM

## 2017-08-06 DIAGNOSIS — Z79899 Other long term (current) drug therapy: Secondary | ICD-10-CM | POA: Diagnosis not present

## 2017-08-06 DIAGNOSIS — I10 Essential (primary) hypertension: Secondary | ICD-10-CM | POA: Insufficient documentation

## 2017-08-06 DIAGNOSIS — Z538 Procedure and treatment not carried out for other reasons: Secondary | ICD-10-CM | POA: Insufficient documentation

## 2017-08-06 DIAGNOSIS — I481 Persistent atrial fibrillation: Secondary | ICD-10-CM | POA: Diagnosis present

## 2017-08-06 DIAGNOSIS — G4733 Obstructive sleep apnea (adult) (pediatric): Secondary | ICD-10-CM | POA: Diagnosis not present

## 2017-08-06 DIAGNOSIS — I420 Dilated cardiomyopathy: Secondary | ICD-10-CM | POA: Insufficient documentation

## 2017-08-06 SURGERY — CANCELLED PROCEDURE

## 2017-08-06 NOTE — Progress Notes (Signed)
Patient arrived to endoscopy for outpatient cardioversion today. Sinus rhythm on monitor, 12-lead EKG reveals same. Verbal order obtained from Dr. Mayford Knifeurner to cancel cardioversion for self conversion. Dr. Mayford Knifeurner spoke with patient prior to discharge to home.

## 2017-08-06 NOTE — Interval H&P Note (Signed)
History and Physical Interval Note:  08/06/2017 10:30 AM  [name]  has presented today for surgery, with the diagnosis of a fib  The various methods of treatment have been discussed with the patient and family. After consideration of risks, benefits and other options for treatment, the patient has consented to  Procedure(s): Cardioversion Cancelled for Self Conversion as a surgical intervention .  The patient's history has been reviewed, patient examined, no change in status, stable for surgery.  I have reviewed the patient's chart and labs.  Questions were answered to the patient's satisfaction.     Armanda Magicraci Corrion Stirewalt

## 2017-08-06 NOTE — Progress Notes (Signed)
Patient noted to be in NSR on presentation to Endoscopy for DCCV.  Instructed to continue current meds.  Instructed patient to call Afib clinic to reschedule followup for first week of May.

## 2017-08-08 ENCOUNTER — Ambulatory Visit (HOSPITAL_COMMUNITY): Payer: PRIVATE HEALTH INSURANCE | Admitting: Nurse Practitioner

## 2017-09-03 ENCOUNTER — Encounter (HOSPITAL_COMMUNITY): Payer: Self-pay | Admitting: Nurse Practitioner

## 2017-09-03 ENCOUNTER — Ambulatory Visit (HOSPITAL_COMMUNITY)
Admission: RE | Admit: 2017-09-03 | Discharge: 2017-09-03 | Disposition: A | Discharge status: Home / Self Care | Payer: PRIVATE HEALTH INSURANCE | Source: Ambulatory Visit | Attending: Nurse Practitioner | Admitting: Nurse Practitioner

## 2017-09-03 VITALS — BP 124/82 | HR 109 | Ht 72.0 in | Wt 240.0 lb

## 2017-09-03 DIAGNOSIS — I420 Dilated cardiomyopathy: Secondary | ICD-10-CM | POA: Insufficient documentation

## 2017-09-03 DIAGNOSIS — R9431 Abnormal electrocardiogram [ECG] [EKG]: Secondary | ICD-10-CM | POA: Insufficient documentation

## 2017-09-03 DIAGNOSIS — G4733 Obstructive sleep apnea (adult) (pediatric): Secondary | ICD-10-CM | POA: Diagnosis not present

## 2017-09-03 DIAGNOSIS — Z87891 Personal history of nicotine dependence: Secondary | ICD-10-CM | POA: Insufficient documentation

## 2017-09-03 DIAGNOSIS — I10 Essential (primary) hypertension: Secondary | ICD-10-CM | POA: Insufficient documentation

## 2017-09-03 DIAGNOSIS — I481 Persistent atrial fibrillation: Secondary | ICD-10-CM | POA: Diagnosis present

## 2017-09-03 DIAGNOSIS — I4892 Unspecified atrial flutter: Secondary | ICD-10-CM | POA: Diagnosis present

## 2017-09-03 DIAGNOSIS — I444 Left anterior fascicular block: Secondary | ICD-10-CM | POA: Diagnosis not present

## 2017-09-03 DIAGNOSIS — Z79899 Other long term (current) drug therapy: Secondary | ICD-10-CM | POA: Diagnosis not present

## 2017-09-03 DIAGNOSIS — I4819 Other persistent atrial fibrillation: Secondary | ICD-10-CM

## 2017-09-03 NOTE — Progress Notes (Addendum)
Primary Care Physician: Dorothyann Peng, MD Referring Physician: Dr. Roger Shelter Achey is a 62 y.o. male with a h/o atrial flutter/atrial  fibrillation, h/o DCM, but normalized with SR, OSA, treated. He was recently found to be out of SR, he was asymptomatic. He is in the afib clinic for f/u. Triggers discussed, he has moderate intake of caffeine and plans to reduce caffeine intake. Other than that, no significant triggers identified. He is still taking sotalol 80 mg bid, has been for over 2 years without any issues with fib/flutter. He did miss a dose of eliquis last week, pretty sure it was before 3/8. He was scheduled for cardioversion when on anticoagulation uninterrupted x 3 weeks. Unfortunately, hd did not cardiovert to SR. Sotalol was increased to 120 mg bid by Dr. Tenny Craw and he was then brought back for cardioversion. HE was then in SR and cardioversion was cancelled.  Today in afib clinic, he has returned to afib, rate controlled at 91 bpm. He is asymptomatic.  Today, he denies symptoms of palpitations, chest pain, shortness of breath, orthopnea, PND, lower extremity edema, dizziness, presyncope, syncope, or neurologic sequela. The patient is tolerating medications without difficulties and is otherwise without complaint today.   Past Medical History:  Diagnosis Date  . Atrial flutter (HCC)   . DCM (dilated cardiomyopathy) (HCC)    EF 35-40% years ago but echo a year ago showed normal LVF  . Dilated aortic root (HCC) 04/15/2015   42mm by echo 06/2017 and Chest CT 12/2016  . Hypertension   . Obesities, morbid (HCC)   . OSA (obstructive sleep apnea)   . Persistent atrial fibrillation [facility])    s/p TEE/DCCV now on Sotolol   Past Surgical History:  Procedure Laterality Date  . CARDIOVERSION N/A 09/24/2014   Procedure: CARDIOVERSION;  Surgeon: Quintella Reichert, MD;  Location[facility] ENDOSCOPY;  Service: Cardiovascular;  Laterality: N/A;  . CARDIOVERSION N/A 08/02/2017   Procedure:  CARDIOVERSION;  Surgeon: Pricilla Riffle, MD;  Location: [facility] ENDOSCOPY;  Service: Cardiovascular;  Laterality: N/A;  . TEE WITHOUT CARDIOVERSION N/A 09/24/2014   Procedure: TRANSESOPHAGEAL ECHOCARDIOGRAM (TEE);  Surgeon: Quintella Reichert, MD;  Location: [facility] ENDOSCOPY;  Service: Cardiovascular;  Laterality: N/A;    Current Outpatient Medications  Medication Sig Dispense Refill  . allopurinol (ZYLOPRIM) 100 MG tablet Take 200 mg by mouth at bedtime.    Marland Kitchen apixaban (ELIQUIS) 5 MG TABS tablet Take 1 tablet (5 mg total) by mouth 2 (two) times daily. 60 tablet 5  . atorvastatin (LIPITOR) 40 MG tablet Take 40 mg by mouth daily.    Marland Kitchen CIALIS 20 MG tablet Take 1 tablet by mouth daily as needed.  3  . colchicine 0.6 MG tablet Take 0.6 mg by mouth daily as needed.     . Insulin Glargine (TOUJEO MAX SOLOSTAR) 300 UNIT/ML SOPN Inject 40 Units into the skin.    Marland Kitchen JARDIANCE 10 MG TABS tablet Take 10 mg by mouth every morning.  1  . levothyroxine (SYNTHROID, LEVOTHROID) 150 MCG tablet Take 150 mcg by mouth daily before breakfast.    . metoprolol tartrate (LOPRESSOR) 25 MG tablet TAKE 1 TABLET BY MOUTH TWICE A DAY 60 tablet 11  . Multiple Vitamins-Minerals (CENTRUM SILVER ADULT 50+ PO) Take 1 tablet by mouth daily.     Marland Kitchen NOVOTWIST 32G X 5 MM MISC See admin instructions.  3  . nystatin cream (MYCOSTATIN) Apply 1 application topically 2 (two) times daily.    Marland Kitchen  sotalol (BETAPACE) 120 MG tablet Take 1 tablet (120 mg total) by mouth 2 (two) times daily. 60 tablet 3  . Vitamin D, Ergocalciferol, (DRISDOL) 50000 units CAPS capsule TAKE ONE CAPSULE BY MOUTH ON TUESDAYS AND FRIDAYS  0   No current facility-administered medications for this encounter.     No Known Allergies  Social History   Socioeconomic History  . Marital status: Married    Spouse name: Not on file  . Number of children: Not on file  . Years of education: Not on file  . Highest education level: Not on file  Occupational History  . Not on file    Social Needs  . Financial resource strain: Not on file  . Food insecurity:    Worry: Not on file    Inability: Not on file  . Transportation needs:    Medical: Not on file    Non-medical: Not on file  Tobacco Use  . Smoking status: Former Games developer  . Smokeless tobacco: Never Used  Substance and Sexual Activity  . Alcohol use: No    Alcohol/week: 0.0 oz  . Drug use: No  . Sexual activity: Not on file  Lifestyle  . Physical activity:    Days per week: Not on file    Minutes per session: Not on file  . Stress: Not on file  Relationships  . Social connections:    Talks on phone: Not on file    Gets together: Not on file    Attends religious service: Not on file    Active member of club or organization: Not on file    Attends meetings of clubs or organizations: Not on file    Relationship status: Not on file  . Intimate partner violence:    Fear of current or ex partner: Not on file    Emotionally abused: Not on file    Physically abused: Not on file    Forced sexual activity: Not on file  Other Topics Concern  . Not on file  Social History Narrative  . Not on file    Family History  Problem Relation Age of Onset  . Stroke Mother   . Heart Problems Father        weak heart  . Heart Problems Paternal Grandfather        pacemaker    ROS- All systems are reviewed and negative except as per the HPI above  Physical Exam: Vitals:   09/03/17 1548  BP: 124/82  Pulse: (!) 109  Weight: 240 lb (108.9 kg)  Height: 6' (1.829 m)   Wt Readings from Last 3 Encounters:  09/03/17 240 lb (108.9 kg)  08/02/17 247 lb 8 oz (112.3 kg)  07/15/17 246 lb (111.6 kg)    Labs: Lab Results  Component Value Date   NA 138 08/01/2017   K 4.6 08/01/2017   CL 106 08/01/2017   CO2 22 08/01/2017   GLUCOSE 214 (H) 08/01/2017   BUN 18 08/01/2017   CREATININE 1.17 08/01/2017   CALCIUM 8.9 08/01/2017   No results found for: INR No results found for: CHOL, HDL, LDLCALC, TRIG   GEN-  The patient is well appearing, alert and oriented x 3 today.   Head- normocephalic, atraumatic Eyes-  Sclera clear, conjunctiva pink Ears- hearing intact Oropharynx- clear Neck- supple, no JVP Lymph- no cervical lymphadenopathy Lungs- Clear to ausculation bilaterally, normal work of breathing Heart- irregular rate and rhythm, no murmurs, rubs or gallops, PMI not laterally displaced GI- soft, NT,  ND, + BS Extremities- no clubbing, cyanosis, or edema MS- no significant deformity or atrophy Skin- no rash or lesion Psych- euthymic mood, full affect Neuro- strength and sensation are intact  EKG- atrial fib at 109 bpm, qrs int 86 ms, qtc 492 ms Echo- 06/2017-Study Conclusions  - Left ventricle: The cavity size was normal. There was mild   concentric hypertrophy. Systolic function was at the lower limits   of normal. The estimated ejection fraction was in the range of   50% to 55%. Wall motion was normal; there were no regional wall   motion abnormalities. - Aortic valve: There was mild regurgitation. - Mitral valve: Mildly calcified annulus. - Left atrium: The atrium was moderately dilated. - Right atrium: The atrium was mildly dilated.  Impressions:  - During the study, the rhythm appears to be atrial flutter with   variable AV block.   Assessment and Plan: 1. Atrial flutter/fib with variable block Failed increase in Sotalol to maintain SR Asymptomatic Rate controlled  Continue sotalol and metoprolol at current doses We discussed options as sotalol is not working to keep pt in SR Change in antiarrythmic and ablation discussed He preferred to pursue appointment with Dr. Johney Frame to discuss abaltion Continue to treat sleep apnea Weight loss encouraged   Lupita Leash C. Matthew Folks [facility] [address] [place], [place] [address] [phone]

## 2017-10-09 ENCOUNTER — Ambulatory Visit: Payer: PRIVATE HEALTH INSURANCE | Admitting: Internal Medicine

## 2017-10-09 ENCOUNTER — Encounter: Payer: Self-pay | Admitting: Internal Medicine

## 2017-10-09 VITALS — BP 132/74 | HR 80 | Ht 72.0 in | Wt 239.0 lb

## 2017-10-09 DIAGNOSIS — I5022 Chronic systolic (congestive) heart failure: Secondary | ICD-10-CM

## 2017-10-09 DIAGNOSIS — Z9989 Dependence on other enabling machines and devices: Secondary | ICD-10-CM

## 2017-10-09 DIAGNOSIS — I481 Persistent atrial fibrillation: Secondary | ICD-10-CM | POA: Diagnosis not present

## 2017-10-09 DIAGNOSIS — G4733 Obstructive sleep apnea (adult) (pediatric): Secondary | ICD-10-CM

## 2017-10-09 DIAGNOSIS — I484 Atypical atrial flutter: Secondary | ICD-10-CM

## 2017-10-09 DIAGNOSIS — I4819 Other persistent atrial fibrillation: Secondary | ICD-10-CM

## 2017-10-09 NOTE — Progress Notes (Signed)
PCP: Dorothyann Peng, MD Primary Cardiologist: Dr Mayford Knife Primary EP: Dr Ardeen Garland Colin [name] is a 62 y.o. male who presents today for routine electrophysiology followup.  Since last being seen in our clinic, the patient reports doing reasonably well.  He has been followed by the AF clinic and with Dr Mayford Knife.  He is mostly asymptomatic with his atrial arrhythmias.  He prefers a conservative approach.  He recently failed cardioversion despite sotalol 120mg  BID.  Today, he denies symptoms of palpitations, chest pain, shortness of breath,  lower extremity edema, dizziness, presyncope, or syncope.  The patient is otherwise without complaint today.   Past Medical History:  Diagnosis Date  . Atrial flutter (HCC)   . DCM (dilated cardiomyopathy) (HCC)    EF 35-40% years ago but echo a year ago showed normal LVF  . Dilated aortic root (HCC) 04/15/2015   42mm by echo 06/2017 and Chest CT 12/2016  . Hypertension   . Obesities, morbid (HCC)   . OSA (obstructive sleep apnea)   . Persistent atrial fibrillation [facility])    s/p TEE/DCCV now on Sotolol   Past Surgical History:  Procedure Laterality Date  . CARDIOVERSION N/A 09/24/2014   Procedure: CARDIOVERSION;  Surgeon: Quintella Reichert, MD;  Location: [facility] ENDOSCOPY;  Service: Cardiovascular;  Laterality: N/A;  . CARDIOVERSION N/A 08/02/2017   Procedure: CARDIOVERSION;  Surgeon: Pricilla Riffle, MD;  Location: [facility] ENDOSCOPY;  Service: Cardiovascular;  Laterality: N/A;  . TEE WITHOUT CARDIOVERSION N/A 09/24/2014   Procedure: TRANSESOPHAGEAL ECHOCARDIOGRAM (TEE);  Surgeon: Quintella Reichert, MD;  Location: [facility] ENDOSCOPY;  Service: Cardiovascular;  Laterality: N/A;    ROS- all systems are reviewed and negatives except as per HPI above  Current Outpatient Medications  Medication Sig Dispense Refill  . allopurinol (ZYLOPRIM) 100 MG tablet Take 200 mg by mouth at bedtime.    Marland Kitchen apixaban (ELIQUIS) 5 MG TABS tablet Take 1 tablet (5 mg total) by mouth 2 (two) times  daily. 60 tablet 5  . atorvastatin (LIPITOR) 40 MG tablet Take 40 mg by mouth daily.    Marland Kitchen CIALIS 20 MG tablet Take 1 tablet by mouth daily as needed.  3  . colchicine 0.6 MG tablet Take 0.6 mg by mouth daily as needed.     . Insulin Glargine (TOUJEO MAX SOLOSTAR) 300 UNIT/ML SOPN Inject 40 Units into the skin.    Marland Kitchen JARDIANCE 10 MG TABS tablet Take 10 mg by mouth every morning.  1  . levothyroxine (SYNTHROID, LEVOTHROID) 175 MCG tablet Take 175 mcg by mouth daily.    . metoprolol tartrate (LOPRESSOR) 25 MG tablet TAKE 1 TABLET BY MOUTH TWICE A DAY 60 tablet 11  . Multiple Vitamins-Minerals (CENTRUM SILVER ADULT 50+ PO) Take 1 tablet by mouth daily.     Marland Kitchen NOVOTWIST 32G X 5 MM MISC See admin instructions.  3  . nystatin cream (MYCOSTATIN) Apply 1 application topically 2 (two) times daily.    . Semaglutide (OZEMPIC ) Inject into the skin as directed.    . sotalol (BETAPACE) 80 MG tablet Take 80 mg by mouth 2 (two) times daily.    . Vitamin D, Ergocalciferol, (DRISDOL) 50000 units CAPS capsule TAKE ONE CAPSULE BY MOUTH ON TUESDAYS AND FRIDAYS  0  . sotalol (BETAPACE) 120 MG tablet Take 1 tablet (120 mg total) by mouth 2 (two) times daily. (Patient not taking: Reported on 10/09/2017) 60 tablet 3   No current facility-administered medications for this visit.     Physical  Exam: Vitals:   10/09/17 1537  BP: 132/74  Pulse: 80  Weight: 239 lb (108.4 kg)  Height: 6' (1.829 m)    GEN- The patient is well appearing, alert and oriented x 3 today.   Head- normocephalic, atraumatic Eyes-  Sclera clear, conjunctiva pink Ears- hearing intact Oropharynx- clear Lungs- Clear to ausculation bilaterally, normal work of breathing Heart- irregular rate and rhythm, no murmurs, rubs or gallops, PMI not laterally displaced GI- soft, NT, ND, + BS Extremities- no clubbing, cyanosis, or edema  Wt Readings from Last 3 Encounters:  10/09/17 239 lb (108.4 kg)  09/03/17 240 lb (108.9 kg)  08/02/17 247 lb 8 oz  (112.3 kg)    EKG tracing ordered today is personally reviewed and shows atypical atrial flutter, V rate 80 bpm, ekg from 09/03/17 reveals afib  Assessment and Plan:  1. Afib/ atypical atrial flutter Asymptomatic.   His left atrium is moderately enlarged.  Our ability to maintain sinus rhythm long term is probably quite low.  We discussed options of rate control and rhythm control at length today.  He is clear that he would prefer a rate control/ conservative approach going forward. I will therefore stop sotalol today.  2. nonischemic CM Previously improved with sinus Echo 06/24/17 is reviewed and reveals EF 50-55%.  Will need to follow his EF going forward  3. OSA Compliance with CPAP encouraged  4. Obesity Body mass index is 32.41 kg/m. Wt Readings from Last 3 Encounters:  10/09/17 239 lb (108.4 kg)  09/03/17 240 lb (108.9 kg)  08/02/17 247 lb 8 oz (112.3 kg)   Lifestyle modification is encouraged.  He is making some progress.  He will return to see me in 6 weeks.  If still asymptomatic at that time, rate control will be pursued going forward.  Colin RangeJames Grace Haggart MD, [facility] 10/09/2017 4:20 PM

## 2017-10-09 NOTE — Patient Instructions (Addendum)
Medication Instructions:  Your physician has recommended you make the following change in your medication:   1.  STOP taking SOTALOL  Labwork: None ordered.  Testing/Procedures: None ordered.  Follow-Up: Your physician wants you to follow-up in: 6 weeks with Dr. Johney FrameAllred.      Any Other Special Instructions Will Be Listed Below (If Applicable).  If you need a refill on your cardiac medications before your next appointment, please call your pharmacy.

## 2017-11-04 ENCOUNTER — Encounter: Payer: Self-pay | Admitting: Internal Medicine

## 2017-11-13 ENCOUNTER — Encounter: Payer: Self-pay | Admitting: Internal Medicine

## 2017-11-13 ENCOUNTER — Ambulatory Visit (INDEPENDENT_AMBULATORY_CARE_PROVIDER_SITE_OTHER): Payer: PRIVATE HEALTH INSURANCE | Admitting: Internal Medicine

## 2017-11-13 VITALS — BP 116/72 | HR 102 | Ht 72.0 in | Wt 240.4 lb

## 2017-11-13 DIAGNOSIS — I4819 Other persistent atrial fibrillation: Secondary | ICD-10-CM

## 2017-11-13 DIAGNOSIS — I481 Persistent atrial fibrillation: Secondary | ICD-10-CM | POA: Diagnosis not present

## 2017-11-13 DIAGNOSIS — G4733 Obstructive sleep apnea (adult) (pediatric): Secondary | ICD-10-CM

## 2017-11-13 DIAGNOSIS — I484 Atypical atrial flutter: Secondary | ICD-10-CM | POA: Diagnosis not present

## 2017-11-13 DIAGNOSIS — Z9989 Dependence on other enabling machines and devices: Secondary | ICD-10-CM

## 2017-11-13 MED ORDER — METOPROLOL TARTRATE 25 MG PO TABS: 37.5000 mg | ORAL_TABLET | Freq: Two times a day (BID) | ORAL | 3 refills | Status: DC

## 2017-11-13 NOTE — Patient Instructions (Addendum)
Medication Instructions:  Your physician has recommended you make the following change in your medication:  1.  Increase your metoprolol 25 mg- Take 1.5 tablets by mouth twice a day  Labwork: None ordered.  Testing/Procedures: None ordered.  Follow-Up: Your physician wants you to follow-up in: 3 months with Rudi Cocoonna Carroll, NP at the afib clinic. Any Other Special Instructions Will Be Listed Below (If Applicable).  If you need a refill on your cardiac medications before your next appointment, please call your pharmacy.

## 2017-11-13 NOTE — Progress Notes (Signed)
PCP: Dorothyann Peng, MD Primary Cardiologist: Dr Mayford Knife Primary EP: Dr Ardeen Garland Migliaccio is a 62 y.o. male who presents today for routine electrophysiology followup.  Since last being seen in our clinic, the patient reports doing very well.  Today, he denies symptoms of palpitations, chest pain, shortness of breath,  lower extremity edema, dizziness, presyncope, or syncope.  The patient is otherwise without complaint today.   Past Medical History:  Diagnosis Date  . Atrial flutter (HCC)   . DCM (dilated cardiomyopathy) (HCC)    EF 35-40% years ago but echo a year ago showed normal LVF  . Dilated aortic root (HCC) 04/15/2015   42mm by echo 06/2017 and Chest CT 12/2016  . Hypertension   . Obesities, morbid (HCC)   . OSA (obstructive sleep apnea)   . Persistent atrial fibrillation [facility])    s/p TEE/DCCV now on Sotolol   Past Surgical History:  Procedure Laterality Date  . CARDIOVERSION N/A 09/24/2014   Procedure: CARDIOVERSION;  Surgeon: Quintella Reichert, MD;  Location: [facility] ENDOSCOPY;  Service: Cardiovascular;  Laterality: N/A;  . CARDIOVERSION N/A 08/02/2017   Procedure: CARDIOVERSION;  Surgeon: Pricilla Riffle, MD;  Location: [facility] ENDOSCOPY;  Service: Cardiovascular;  Laterality: N/A;  . TEE WITHOUT CARDIOVERSION N/A 09/24/2014   Procedure: TRANSESOPHAGEAL ECHOCARDIOGRAM (TEE);  Surgeon: Quintella Reichert, MD;  Location: [facility] ENDOSCOPY;  Service: Cardiovascular;  Laterality: N/A;    ROS- all systems are reviewed and negatives except as per HPI above  Current Outpatient Medications  Medication Sig Dispense Refill  . allopurinol (ZYLOPRIM) 100 MG tablet Take 200 mg by mouth at bedtime.    Marland Kitchen apixaban (ELIQUIS) 5 MG TABS tablet Take 1 tablet (5 mg total) by mouth 2 (two) times daily. 60 tablet 5  . atorvastatin (LIPITOR) 40 MG tablet Take 40 mg by mouth daily.    Marland Kitchen CIALIS 20 MG tablet Take 1 tablet by mouth daily as needed.  3  . colchicine 0.6 MG tablet Take 0.6 mg by mouth daily as needed.      . Insulin Glargine (TOUJEO MAX SOLOSTAR) 300 UNIT/ML SOPN Inject 40 Units into the skin.    Marland Kitchen JARDIANCE 10 MG TABS tablet Take 10 mg by mouth every morning.  1  . levothyroxine (SYNTHROID, LEVOTHROID) 175 MCG tablet Take 175 mcg by mouth daily.    . metoprolol tartrate (LOPRESSOR) 25 MG tablet TAKE 1 TABLET BY MOUTH TWICE A DAY 60 tablet 11  . Multiple Vitamins-Minerals (CENTRUM SILVER ADULT 50+ PO) Take 1 tablet by mouth daily.     Marland Kitchen NOVOTWIST 32G X 5 MM MISC See admin instructions.  3  . nystatin cream (MYCOSTATIN) Apply 1 application topically 2 (two) times daily.    . predniSONE (DELTASONE) 5 MG tablet   0  . Semaglutide (OZEMPIC [place]) Inject into the skin as directed.    . Vitamin D, Ergocalciferol, (DRISDOL) 50000 units CAPS capsule TAKE ONE CAPSULE BY MOUTH ON TUESDAYS AND FRIDAYS  0   No current facility-administered medications for this visit.     Physical Exam: Vitals:   11/13/17 1702  BP: 116/72  Pulse: (!) 102  SpO2: 98%  Weight: 240 lb 6.4 oz (109 kg)  Height: 6' (1.829 m)    GEN- The patient is well appearing, alert and oriented x 3 today.   Head- normocephalic, atraumatic Eyes-  Sclera clear, conjunctiva pink Ears- hearing intact Oropharynx- clear Lungs- Clear to ausculation bilaterally, normal work of breathing Heart- tachycardic irregular rhythm,  no murmurs, rubs or gallops, PMI not laterally displaced GI- soft, NT, ND, + BS Extremities- no clubbing, cyanosis, or edema  Wt Readings from Last 3 Encounters:  11/13/17 240 lb 6.4 oz (109 kg)  10/09/17 239 lb (108.4 kg)  09/03/17 240 lb (108.9 kg)    EKG tracing ordered today is personally reviewed and shows afib, V rate 102 bpm  Assessment and Plan:  1. Persistent afib/ atypical atrial flutter Doing well with rate control and off AAD therapy Continue long term anticoagulation Increase metoprolol to 37.5mg  BID.  This can be further increased if required for rate control.  2. Nonischemic CM Stable, EF has  previously recovered No change required today  3. OSA Compliance with CPAP encouraged  4. Obesity Body mass index is 32.6 kg/m. Wt Readings from Last 3 Encounters:  11/13/17 240 lb 6.4 oz (109 kg)  10/09/17 239 lb (108.4 kg)  09/03/17 240 lb (108.9 kg)   Follow-up with AF clinic and Dr Mayford Knifeurner going forward I will see as needed  Hillis RangeJames Ridhi Hoffert MD, [facility] Center[facility] 11/13/2017 5:25 PM

## 2018-01-03 ENCOUNTER — Other Ambulatory Visit: Payer: Self-pay | Admitting: Cardiology

## 2018-01-03 NOTE — Telephone Encounter (Signed)
Pt last saw Dr Johney FrameAllred 11/13/17, last labs 08/01/17 Creat 1.17, age 62, weight 109kg, based on specified criteria pt is on appropriate dosage of Eliquis 5mg  BID.  Will refill rx.

## 2018-01-03 NOTE — Telephone Encounter (Signed)
eliquis  

## 2018-01-03 NOTE — Telephone Encounter (Signed)
Order Providers   Prescribing Provider Encounter Provider  Hillis RangeAllred, James, MD Hillis RangeAllred, James, MD  Outpatient Medication Detail    Disp Refills Start End   metoprolol tartrate (LOPRESSOR) 25 MG tablet 270 tablet 3 11/13/2017    Sig - Route: Take 1.5 tablets (37.5 mg total) by mouth 2 (two) times daily. - Oral   Sent to pharmacy as: metoprolol tartrate (LOPRESSOR) 25 MG tablet   E-Prescribing Status: Receipt confirmed by pharmacy (11/13/2017 5:36 PM EDT)   [facility]   [id number] -  [place], [place] - [address] [facility] [address]

## 2018-03-11 ENCOUNTER — Other Ambulatory Visit: Payer: Self-pay | Admitting: Nurse Practitioner

## 2018-03-12 ENCOUNTER — Other Ambulatory Visit: Payer: Self-pay

## 2018-03-17 ENCOUNTER — Encounter (HOSPITAL_COMMUNITY): Payer: Self-pay | Admitting: Nurse Practitioner

## 2018-03-17 ENCOUNTER — Ambulatory Visit (HOSPITAL_COMMUNITY)
Admission: RE | Admit: 2018-03-17 | Discharge: 2018-03-17 | Disposition: A | Discharge status: Home / Self Care | Payer: PRIVATE HEALTH INSURANCE | Source: Ambulatory Visit | Attending: Nurse Practitioner | Admitting: Nurse Practitioner

## 2018-03-17 VITALS — BP 126/72 | HR 136 | Ht 72.0 in | Wt 241.0 lb

## 2018-03-17 DIAGNOSIS — I4891 Unspecified atrial fibrillation: Secondary | ICD-10-CM | POA: Insufficient documentation

## 2018-03-17 DIAGNOSIS — Z79890 Hormone replacement therapy (postmenopausal): Secondary | ICD-10-CM | POA: Diagnosis not present

## 2018-03-17 DIAGNOSIS — I10 Essential (primary) hypertension: Secondary | ICD-10-CM | POA: Insufficient documentation

## 2018-03-17 DIAGNOSIS — I484 Atypical atrial flutter: Secondary | ICD-10-CM | POA: Diagnosis not present

## 2018-03-17 DIAGNOSIS — Z87891 Personal history of nicotine dependence: Secondary | ICD-10-CM | POA: Diagnosis not present

## 2018-03-17 DIAGNOSIS — Z8249 Family history of ischemic heart disease and other diseases of the circulatory system: Secondary | ICD-10-CM | POA: Insufficient documentation

## 2018-03-17 DIAGNOSIS — Z7901 Long term (current) use of anticoagulants: Secondary | ICD-10-CM | POA: Insufficient documentation

## 2018-03-17 DIAGNOSIS — G4733 Obstructive sleep apnea (adult) (pediatric): Secondary | ICD-10-CM | POA: Insufficient documentation

## 2018-03-17 DIAGNOSIS — Z79899 Other long term (current) drug therapy: Secondary | ICD-10-CM | POA: Insufficient documentation

## 2018-03-17 DIAGNOSIS — I420 Dilated cardiomyopathy: Secondary | ICD-10-CM | POA: Diagnosis not present

## 2018-03-17 DIAGNOSIS — I4430 Unspecified atrioventricular block: Secondary | ICD-10-CM | POA: Insufficient documentation

## 2018-03-17 DIAGNOSIS — I4892 Unspecified atrial flutter: Secondary | ICD-10-CM | POA: Diagnosis not present

## 2018-03-17 DIAGNOSIS — I351 Nonrheumatic aortic (valve) insufficiency: Secondary | ICD-10-CM | POA: Insufficient documentation

## 2018-03-17 MED ORDER — METOPROLOL TARTRATE 50 MG PO TABS: 50.0000 mg | ORAL_TABLET | Freq: Two times a day (BID) | ORAL | 3 refills | Status: DC

## 2018-03-17 NOTE — Patient Instructions (Signed)
Increase metoprolol to 50mg twice a day 

## 2018-03-17 NOTE — Progress Notes (Signed)
Primary Care Physician: Colin Peng, MD Referring Physician: Dr. Roger Shelter [name] is a 62 y.o. male with a h/o atrial flutter/atrial  fibrillation, h/o DCM, but normalized with SR, OSA, treated. He was recently found to be out of SR, he was asymptomatic. He is in the afib clinic for f/u. Triggers discussed, he has moderate intake of caffeine and plans to reduce caffeine intake. Other than that, no significant triggers identified. He is still taking sotalol 80 mg bid, has been for over 2 years without any issues with fib/flutter. He did miss a dose of eliquis last week, pretty sure it was before 3/8. He was scheduled for cardioversion when on anticoagulation uninterrupted x 3 weeks. Unfortunately, hd did not cardiovert to SR. Sotalol was increased to 120 mg bid by Dr. Tenny Craw and he was then brought back for cardioversion. He was then in SR and cardioversion was cancelled.  Today in afib clinic, he has returned to afib, rate controlled at 91 bpm. He is asymptomatic.  F/u in afib clinic 11/11. He has seen Dr. Johney Frame and it was decided to stop sotalol and rate control pt as pt did not want to do other antiarrythmic's. His metoprolol was increased to 37.5 mg bid, as he wanted a conservative approach.   Today, he does not feel the afib but his v rate is 136 bpm.  Today, he denies symptoms of palpitations, chest pain, shortness of breath, orthopnea, PND, lower extremity edema, dizziness, presyncope, syncope, or neurologic sequela. The patient is tolerating medications without difficulties and is otherwise without complaint today.   Past Medical History:  Diagnosis Date  . Atrial flutter (HCC)   . DCM (dilated cardiomyopathy) (HCC)    EF 35-40% years ago but echo a year ago showed normal LVF  . Dilated aortic root (HCC) 04/15/2015   42mm by echo 06/2017 and Chest CT 12/2016  . Hypertension   . Obesities, morbid (HCC)   . OSA (obstructive sleep apnea)   . Persistent atrial fibrillation      s/p TEE/DCCV now on Sotolol   Past Surgical History:  Procedure Laterality Date  . CARDIOVERSION N/A 09/24/2014   Procedure: CARDIOVERSION;  Surgeon: Quintella Reichert, MD;  Location: [facility] ENDOSCOPY;  Service: Cardiovascular;  Laterality: N/A;  . CARDIOVERSION N/A 08/02/2017   Procedure: CARDIOVERSION;  Surgeon: Pricilla Riffle, MD;  Location: [facility] ENDOSCOPY;  Service: Cardiovascular;  Laterality: N/A;  . TEE WITHOUT CARDIOVERSION N/A 09/24/2014   Procedure: TRANSESOPHAGEAL ECHOCARDIOGRAM (TEE);  Surgeon: Quintella Reichert, MD;  Location: [facility] ENDOSCOPY;  Service: Cardiovascular;  Laterality: N/A;    Current Outpatient Medications  Medication Sig Dispense Refill  . allopurinol (ZYLOPRIM) 100 MG tablet Take 200 mg by mouth at bedtime.    Marland Kitchen atorvastatin (LIPITOR) 40 MG tablet Take 40 mg by mouth daily.    Marland Kitchen CIALIS 20 MG tablet Take 1 tablet by mouth daily as needed.  3  . colchicine 0.6 MG tablet Take 0.6 mg by mouth daily as needed. colcrys    . ELIQUIS 5 MG TABS tablet TAKE 1 TABLET(5 MG) BY MOUTH TWICE DAILY 60 tablet 6  . levothyroxine (SYNTHROID, LEVOTHROID) 175 MCG tablet Take 175 mcg by mouth daily.    . metoprolol tartrate (LOPRESSOR) 50 MG tablet Take 1 tablet (50 mg total) by mouth 2 (two) times daily. 60 tablet 3  . Multiple Vitamins-Minerals (CENTRUM SILVER ADULT 50+ PO) Take 1 tablet by mouth daily.     Marland Kitchen NOVOTWIST 32G X  5 MM MISC See admin instructions.  3  . nystatin cream (MYCOSTATIN) Apply 1 application topically 2 (two) times daily.    . Semaglutide (OZEMPIC [place]) Inject into the skin as directed.    Nathen May SOLOSTAR 300 UNIT/ML SOPN INJECT 40 UNITS UNDER THE SKIN AS PER INSULIN PROTOCOL 5 pen 1  . Vitamin D, Ergocalciferol, (DRISDOL) 50000 units CAPS capsule TAKE ONE CAPSULE BY MOUTH ON TUESDAYS AND FRIDAYS  0  . predniSONE (DELTASONE) 5 MG tablet   0   No current facility-administered medications for this encounter.     No Known Allergies  Social History   Socioeconomic History   . Marital status: Married    Spouse name: Not on file  . Number of children: Not on file  . Years of education: Not on file  . Highest education level: Not on file  Occupational History  . Not on file  Social Needs  . Financial resource strain: Not on file  . Food insecurity:    Worry: Not on file    Inability: Not on file  . Transportation needs:    Medical: Not on file    Non-medical: Not on file  Tobacco Use  . Smoking status: Former Games developer  . Smokeless tobacco: Never Used  Substance and Sexual Activity  . Alcohol use: No    Alcohol/week: 0.0 standard drinks  . Drug use: No  . Sexual activity: Not on file  Lifestyle  . Physical activity:    Days per week: Not on file    Minutes per session: Not on file  . Stress: Not on file  Relationships  . Social connections:    Talks on phone: Not on file    Gets together: Not on file    Attends religious service: Not on file    Active member of club or organization: Not on file    Attends meetings of clubs or organizations: Not on file    Relationship status: Not on file  . Intimate partner violence:    Fear of current or ex partner: Not on file    Emotionally abused: Not on file    Physically abused: Not on file    Forced sexual activity: Not on file  Other Topics Concern  . Not on file  Social History Narrative  . Not on file    Family History  Problem Relation Age of Onset  . Stroke Mother   . Heart Problems Father        weak heart  . Heart Problems Paternal Grandfather        pacemaker    ROS- All systems are reviewed and negative except as per the HPI above  Physical Exam: Vitals:   03/17/18 1600  BP: 126/72  Pulse: (!) 136  Weight: 109.3 kg  Height: 6' (1.829 m)   Wt Readings from Last 3 Encounters:  03/17/18 109.3 kg  11/13/17 109 kg  10/09/17 108.4 kg    Labs: Lab Results  Component Value Date   NA 138 08/01/2017   K 4.6 08/01/2017   CL 106 08/01/2017   CO2 22 08/01/2017   GLUCOSE 214  (H) 08/01/2017   BUN 18 08/01/2017   CREATININE 1.17 08/01/2017   CALCIUM 8.9 08/01/2017   No results found for: INR No results found for: CHOL, HDL, LDLCALC, TRIG   GEN- The patient is well appearing, alert and oriented x 3 today.   Head- normocephalic, atraumatic Eyes-  Sclera clear, conjunctiva pink Ears- hearing intact  Oropharynx- clear Neck- supple, no JVP Lymph- no cervical lymphadenopathy Lungs- Clear to ausculation bilaterally, normal work of breathing Heart- irregular rate and rhythm, no murmurs, rubs or gallops, PMI not laterally displaced GI- soft, NT, ND, + BS Extremities- no clubbing, cyanosis, or edema MS- no significant deformity or atrophy Skin- no rash or lesion Psych- euthymic mood, full affect Neuro- strength and sensation are intact  EKG- atrial flutter at 136 bpm, qrs int 86 ms, qtc 492 ms Echo- 06/2017-Study Conclusions  - Left ventricle: The cavity size was normal. There was mild   concentric hypertrophy. Systolic function was at the lower limits   of normal. The estimated ejection fraction was in the range of   50% to 55%. Wall motion was normal; there were no regional wall   motion abnormalities. - Aortic valve: There was mild regurgitation. - Mitral valve: Mildly calcified annulus. - Left atrium: The atrium was moderately dilated. - Right atrium: The atrium was mildly dilated.  Impressions:  - During the study, the rhythm appears to be atrial flutter with   variable AV block.   Assessment and Plan: 1. Atrial flutter/fib with variable block Failed increase in Sotalol to maintain SR Asymptomatic Sotalol stopped by Dr. Johney Frame in July and increased rate control He has rvr today but is asymptomatic  Increase metoprolol to 50 mg bid We discussed options  He still wants a conservative approach,so will continue with rate control, but may entertain Tikosyn after the first of the year when he has  more time off He is continuing to treat sleep  apnea Weight loss encouraged  F/u in 7-10 days  Lupita Leash C. Matthew Folks [facility] [address] [place], [place] [address] [phone]

## 2018-03-24 ENCOUNTER — Ambulatory Visit (HOSPITAL_COMMUNITY)
Admission: RE | Admit: 2018-03-24 | Discharge: 2018-03-24 | Disposition: A | Discharge status: Home / Self Care | Payer: PRIVATE HEALTH INSURANCE | Source: Ambulatory Visit | Attending: Nurse Practitioner | Admitting: Nurse Practitioner

## 2018-03-24 ENCOUNTER — Encounter (HOSPITAL_COMMUNITY): Payer: Self-pay | Admitting: Nurse Practitioner

## 2018-03-24 DIAGNOSIS — I4891 Unspecified atrial fibrillation: Secondary | ICD-10-CM | POA: Insufficient documentation

## 2018-03-24 DIAGNOSIS — I4892 Unspecified atrial flutter: Secondary | ICD-10-CM | POA: Diagnosis not present

## 2018-03-24 NOTE — Progress Notes (Addendum)
Pt in for repeat EKG today.  To be reviewed by Rudi Cocoonna Zanna Hawn, NP   Pt shows better rate control with increase in BB. Afib at 99 bpm. He feels well. He will have f/u in 2 weeks.

## 2018-03-31 ENCOUNTER — Ambulatory Visit: Payer: PRIVATE HEALTH INSURANCE | Admitting: Internal Medicine

## 2018-04-02 ENCOUNTER — Ambulatory Visit: Payer: PRIVATE HEALTH INSURANCE | Admitting: Internal Medicine

## 2018-04-10 ENCOUNTER — Other Ambulatory Visit: Payer: Self-pay

## 2018-04-10 ENCOUNTER — Encounter: Payer: Self-pay | Admitting: Internal Medicine

## 2018-04-10 ENCOUNTER — Ambulatory Visit: Payer: PRIVATE HEALTH INSURANCE | Admitting: Internal Medicine

## 2018-04-10 VITALS — BP 108/66 | HR 88 | Temp 97.5°F | Ht 72.0 in | Wt 246.8 lb

## 2018-04-10 DIAGNOSIS — I483 Typical atrial flutter: Secondary | ICD-10-CM

## 2018-04-10 DIAGNOSIS — E1122 Type 2 diabetes mellitus with diabetic chronic kidney disease: Secondary | ICD-10-CM | POA: Diagnosis not present

## 2018-04-10 DIAGNOSIS — I1310 Hypertensive heart and chronic kidney disease without heart failure, with stage 1 through stage 4 chronic kidney disease, or unspecified chronic kidney disease: Secondary | ICD-10-CM | POA: Diagnosis not present

## 2018-04-10 DIAGNOSIS — G4733 Obstructive sleep apnea (adult) (pediatric): Secondary | ICD-10-CM

## 2018-04-10 DIAGNOSIS — E1165 Type 2 diabetes mellitus with hyperglycemia: Secondary | ICD-10-CM

## 2018-04-10 DIAGNOSIS — IMO0002 Reserved for concepts with insufficient information to code with codable children: Secondary | ICD-10-CM

## 2018-04-10 DIAGNOSIS — Z6833 Body mass index (BMI) 33.0-33.9, adult: Secondary | ICD-10-CM

## 2018-04-10 DIAGNOSIS — N182 Chronic kidney disease, stage 2 (mild): Secondary | ICD-10-CM | POA: Diagnosis not present

## 2018-04-10 DIAGNOSIS — E6609 Other obesity due to excess calories: Secondary | ICD-10-CM

## 2018-04-10 DIAGNOSIS — Z9989 Dependence on other enabling machines and devices: Secondary | ICD-10-CM

## 2018-04-10 DIAGNOSIS — Z794 Long term (current) use of insulin: Secondary | ICD-10-CM

## 2018-04-10 MED ORDER — SILDENAFIL CITRATE 100 MG PO TABS: ORAL_TABLET | ORAL | 1 refills | Status: DC

## 2018-04-10 NOTE — Patient Instructions (Signed)

## 2018-04-11 LAB — CMP14+EGFR
ALK PHOS: 134 IU/L — AB (ref 39–117)
ALT: 49 IU/L — AB (ref 0–44)
AST: 26 IU/L (ref 0–40)
Albumin/Globulin Ratio: 2 (ref 1.2–2.2)
Albumin: 4.3 g/dL (ref 3.6–4.8)
BILIRUBIN TOTAL: 0.5 mg/dL (ref 0.0–1.2)
BUN / CREAT RATIO: 12 (ref 10–24)
BUN: 14 mg/dL (ref 8–27)
CHLORIDE: 102 mmol/L (ref 96–106)
CO2: 21 mmol/L (ref 20–29)
Calcium: 9 mg/dL (ref 8.6–10.2)
Creatinine, Ser: 1.13 mg/dL (ref 0.76–1.27)
GFR calc Af Amer: 81 mL/min/{1.73_m2} (ref >59)
GFR calc non Af Amer: 70 mL/min/{1.73_m2} (ref >59)
GLUCOSE: 222 mg/dL — AB (ref 65–99)
Globulin, Total: 2.1 g/dL (ref 1.5–4.5)
Potassium: 4 mmol/L (ref 3.5–5.2)
Sodium: 139 mmol/L (ref 134–144)
Total Protein: 6.4 g/dL (ref 6.0–8.5)

## 2018-04-11 LAB — HEMOGLOBIN A1C
Est. average glucose Bld gHb Est-mCnc: 260 mg/dL
HEMOGLOBIN A1C: 10.7 % — AB (ref 4.8–5.6)

## 2018-04-13 ENCOUNTER — Encounter: Payer: Self-pay | Admitting: Internal Medicine

## 2018-04-13 ENCOUNTER — Other Ambulatory Visit: Payer: Self-pay | Admitting: Internal Medicine

## 2018-04-13 NOTE — Progress Notes (Signed)
Subjective:     Patient ID: Colin [name] , male    DOB: [birth year] , 62 y.o.   MRN: [id number]   Chief Complaint  Patient presents with  . Diabetes  . Hypertension    HPI  Reports he stopped Jardiance due to severe, smelly rash in his groin.   Diabetes  He presents for his follow-up diabetic visit. He has type 2 diabetes mellitus. His disease course has been improving. There are no hypoglycemic associated symptoms. Pertinent negatives for diabetes include no blurred vision. There are no hypoglycemic complications. Risk factors for coronary artery disease include diabetes mellitus, dyslipidemia, hypertension, male sex, obesity and sedentary lifestyle. Current diabetic treatment includes insulin injections and oral agent (monotherapy).  Hypertension  This is a chronic problem. The current episode started more than 1 year ago. The problem has been gradually improving since onset. Pertinent negatives include no blurred vision.   He reports compliance with meds.   Past Medical History:  Diagnosis Date  . Atrial flutter ([place])   . DCM (dilated cardiomyopathy) ([place])    EF 35-40% years ago but echo a year ago showed normal LVF  . Dilated aortic root ([place]) 04/15/2015   40m by echo 06/2017 and Chest CT 12/2016  . Hypertension   . Obesities, morbid ([place]   . OSA (obstructive sleep apnea)   . Persistent atrial fibrillation    s/p TEE/DCCV now on Sotolol     Family History  Problem Relation Age of Onset  . Stroke Mother   . Heart Problems Father        weak heart  . Heart Problems Paternal Grandfather        pacemaker     Current Outpatient Medications:  .  allopurinol (ZYLOPRIM) 100 MG tablet, Take 200 mg by mouth at bedtime., Disp: , Rfl:  .  atorvastatin (LIPITOR) 80 MG tablet, TK 1 T PO QD, Disp: , Rfl: 0 .  CIALIS 20 MG tablet, Take 1 tablet by mouth daily as needed., Disp: , Rfl: 3 .  colchicine 0.6 MG tablet, Take 0.6 mg by mouth daily as needed. colcrys, Disp: , Rfl:  .   ELIQUIS 5 MG TABS tablet, TAKE 1 TABLET(5 MG) BY MOUTH TWICE DAILY, Disp: 60 tablet, Rfl: 6 .  levothyroxine (SYNTHROID, LEVOTHROID) 200 MCG tablet, TK 1 T PO QD, Disp: , Rfl: 0 .  metoprolol tartrate (LOPRESSOR) 50 MG tablet, Take 1 tablet (50 mg total) by mouth 2 (two) times daily., Disp: 60 tablet, Rfl: 3 .  Multiple Vitamins-Minerals (CENTRUM SILVER ADULT 50+ PO), Take 1 tablet by mouth daily. , Disp: , Rfl:  .  NOVOTWIST 32G X 5 MM MISC, See admin instructions., Disp: , Rfl: 3 .  nystatin cream (MYCOSTATIN), Apply 1 application topically 2 (two) times daily., Disp: , Rfl:  .  predniSONE (DELTASONE) 5 MG tablet, , Disp: , Rfl: 0 .  Semaglutide (OZEMPIC [place]), Inject 0.5 mg into the skin as directed. , Disp: , Rfl:  .  TOUJEO SOLOSTAR 300 UNIT/ML SOPN, INJECT 40 UNITS UNDER THE SKIN AS PER INSULIN PROTOCOL, Disp: 5 pen, Rfl: 1 .  Vitamin D, Ergocalciferol, (DRISDOL) 50000 units CAPS capsule, TAKE ONE CAPSULE BY MOUTH ON TUESDAYS AND FRIDAYS, Disp: , Rfl: 0 .  sildenafil (VIAGRA) 100 MG tablet, TK 1 T PO QD 1 HOUR B SEXUAL ACTIVITY PRN, Disp: 10 tablet, Rfl: 1   No Known Allergies   Review of Systems  Constitutional: Negative.   Eyes: Negative for blurred  vision.  Respiratory: Negative.   Cardiovascular: Negative.   Gastrointestinal: Negative.   Genitourinary: Negative.   Neurological: Negative.   Psychiatric/Behavioral: Negative.      Today's Vitals   04/10/18 1553  BP: 108/66  Pulse: 88  Temp: (!) 97.5 F (36.4 C)  TempSrc: Oral  Weight: 246 lb 12.8 oz (111.9 kg)  Height: 6' (1.829 m)   Body mass index is 33.47 kg/m.   Objective:  Physical Exam  Constitutional: He is oriented to person, place, and time. He appears well-developed and well-nourished.  HENT:  Head: Normocephalic and atraumatic.  Eyes: EOM are normal.  Cardiovascular: Normal rate, regular rhythm and normal heart sounds.  Pulmonary/Chest: Effort normal and breath sounds normal.  Neurological: He is alert  and oriented to person, place, and time.  Psychiatric: He has a normal mood and affect.  Nursing note and vitals reviewed.       Assessment And Plan:     1. Uncontrolled type 2 diabetes mellitus with stage 2 chronic kidney disease ([place])  I will check labs as listed below. He is encouraged to incorporate more exercise into his daily routine.  - CMP14+EGFR - Hemoglobin A1c  2. Chronic renal disease, stage II  Chronic. I will check a GFR, Cr today.   3. Hypertensive heart and renal disease with renal failure, stage 1 through stage 4 or unspecified chronic kidney disease, without heart failure  Well controlled. He will continue with current meds. He is encouraged to avoid adding salt to his foods.   4. Typical atrial flutter (HCC)  Chronic. He is also followed by Cardiology.   5. OSA on CPAP  Chronic. He is encouraged to wear CPAP at least four hours per night, ideally six hours.   6. Class 1 obesity due to excess calories with serious comorbidity and body mass index (BMI) of 33.0 to 33.9 in adult  He is encouraged to strive for BMI less than 30 to decrease cardiac risk. He is encouraged to incorporate 4-5 days of moderate exercise, for 30 minutes.  He is also encouraged to avoid processed foods.   Maximino Greenland, MD

## 2018-04-15 ENCOUNTER — Encounter (HOSPITAL_COMMUNITY): Payer: Self-pay | Admitting: Nurse Practitioner

## 2018-04-15 ENCOUNTER — Ambulatory Visit (HOSPITAL_COMMUNITY)
Admission: RE | Admit: 2018-04-15 | Discharge: 2018-04-15 | Disposition: A | Discharge status: Home / Self Care | Payer: PRIVATE HEALTH INSURANCE | Source: Ambulatory Visit | Attending: Nurse Practitioner | Admitting: Nurse Practitioner

## 2018-04-15 VITALS — BP 120/72 | HR 102 | Ht 72.0 in | Wt 247.0 lb

## 2018-04-15 DIAGNOSIS — Z823 Family history of stroke: Secondary | ICD-10-CM | POA: Insufficient documentation

## 2018-04-15 DIAGNOSIS — I4891 Unspecified atrial fibrillation: Secondary | ICD-10-CM | POA: Diagnosis not present

## 2018-04-15 DIAGNOSIS — Z79899 Other long term (current) drug therapy: Secondary | ICD-10-CM | POA: Diagnosis not present

## 2018-04-15 DIAGNOSIS — Z8249 Family history of ischemic heart disease and other diseases of the circulatory system: Secondary | ICD-10-CM | POA: Diagnosis not present

## 2018-04-15 DIAGNOSIS — I420 Dilated cardiomyopathy: Secondary | ICD-10-CM | POA: Diagnosis not present

## 2018-04-15 DIAGNOSIS — G4733 Obstructive sleep apnea (adult) (pediatric): Secondary | ICD-10-CM | POA: Insufficient documentation

## 2018-04-15 DIAGNOSIS — I4892 Unspecified atrial flutter: Secondary | ICD-10-CM | POA: Insufficient documentation

## 2018-04-15 DIAGNOSIS — I4819 Other persistent atrial fibrillation: Secondary | ICD-10-CM | POA: Diagnosis not present

## 2018-04-15 DIAGNOSIS — Z79890 Hormone replacement therapy (postmenopausal): Secondary | ICD-10-CM | POA: Insufficient documentation

## 2018-04-15 DIAGNOSIS — Z7901 Long term (current) use of anticoagulants: Secondary | ICD-10-CM | POA: Diagnosis not present

## 2018-04-15 DIAGNOSIS — Z794 Long term (current) use of insulin: Secondary | ICD-10-CM | POA: Insufficient documentation

## 2018-04-15 DIAGNOSIS — Z87891 Personal history of nicotine dependence: Secondary | ICD-10-CM | POA: Insufficient documentation

## 2018-04-15 DIAGNOSIS — I10 Essential (primary) hypertension: Secondary | ICD-10-CM | POA: Insufficient documentation

## 2018-04-16 NOTE — Progress Notes (Signed)
Primary Care Physician: Dorothyann Peng, MD Referring Physician: Dr. Roger Shelter Colin [name] is a 62 y.o. male with a h/o atrial flutter/atrial  fibrillation, h/o DCM, but normalized with SR, OSA, treated. He was recently found to be out of SR, he was asymptomatic. He is in the afib clinic for f/u. Triggers discussed, he has moderate intake of caffeine and plans to reduce caffeine intake. Other than that, no significant triggers identified. He is still taking sotalol 80 mg bid, has been for over 2 years without any issues with fib/flutter. He did miss a dose of eliquis last week, pretty sure it was before 3/8. He was scheduled for cardioversion when on anticoagulation uninterrupted x 3 weeks. Unfortunately, hd did not cardiovert to SR. Sotalol was increased to 120 mg bid by Dr. Tenny Craw and he was then brought back for cardioversion. He was then in SR and cardioversion was cancelled.  Today in afib clinic, he has returned to afib, rate controlled at 91 bpm. He is asymptomatic.  F/u in afib clinic 11/11. He has seen Dr. Johney Frame and it was decided to stop sotalol and rate control pt as pt did not want to do other antiarrythmic's. His metoprolol was increased to 37.5 mg bid, as he wanted a conservative approach.   Today, he does not feel the afib but his v rate is 136 bpm.  F/u in afib clinic 12/11. He is back for evaluation of rate control. He is at afib of 100 bpm today, 135 on last visit. At PCP recently 88 bpm, and this is what he is finding at home. He still feels well in afib and wants to continue with rate control.  Today, he denies symptoms of palpitations, chest pain, shortness of breath, orthopnea, PND, lower extremity edema, dizziness, presyncope, syncope, or neurologic sequela. The patient is tolerating medications without difficulties and is otherwise without complaint today.   Past Medical History:  Diagnosis Date  . Atrial flutter (HCC)   . DCM (dilated cardiomyopathy) (HCC)    EF  35-40% years ago but echo a year ago showed normal LVF  . Dilated aortic root (HCC) 04/15/2015   42mm by echo 06/2017 and Chest CT 12/2016  . Hypertension   . Obesities, morbid (HCC)   . OSA (obstructive sleep apnea)   . Persistent atrial fibrillation    s/p TEE/DCCV now on Sotolol   Past Surgical History:  Procedure Laterality Date  . CARDIOVERSION N/A 09/24/2014   Procedure: CARDIOVERSION;  Surgeon: Quintella Reichert, MD;  Location: [facility] ENDOSCOPY;  Service: Cardiovascular;  Laterality: N/A;  . CARDIOVERSION N/A 08/02/2017   Procedure: CARDIOVERSION;  Surgeon: Pricilla Riffle, MD;  Location: [facility] ENDOSCOPY;  Service: Cardiovascular;  Laterality: N/A;  . TEE WITHOUT CARDIOVERSION N/A 09/24/2014   Procedure: TRANSESOPHAGEAL ECHOCARDIOGRAM (TEE);  Surgeon: Quintella Reichert, MD;  Location: [facility] ENDOSCOPY;  Service: Cardiovascular;  Laterality: N/A;    Current Outpatient Medications  Medication Sig Dispense Refill  . allopurinol (ZYLOPRIM) 100 MG tablet Take 200 mg by mouth at bedtime.    Marland Kitchen atorvastatin (LIPITOR) 80 MG tablet TK 1 T PO QD  0  . CIALIS 20 MG tablet Take 1 tablet by mouth daily as needed.  3  . colchicine 0.6 MG tablet Take 0.6 mg by mouth daily as needed. colcrys    . ELIQUIS 5 MG TABS tablet TAKE 1 TABLET(5 MG) BY MOUTH TWICE DAILY 60 tablet 6  . levothyroxine (SYNTHROID, LEVOTHROID) 200 MCG tablet TK 1 T  PO QD  0  . metoprolol tartrate (LOPRESSOR) 50 MG tablet Take 1 tablet (50 mg total) by mouth 2 (two) times daily. 60 tablet 3  . Multiple Vitamins-Minerals (CENTRUM SILVER ADULT 50+ PO) Take 1 tablet by mouth daily.     Marland Kitchen NOVOTWIST 32G X 5 MM MISC See admin instructions.  3  . nystatin cream (MYCOSTATIN) Apply 1 application topically 2 (two) times daily.    . Semaglutide (OZEMPIC [place]) Inject 0.5 mg into the skin as directed.     . sildenafil (VIAGRA) 100 MG tablet TK 1 T PO QD 1 HOUR B SEXUAL ACTIVITY PRN 10 tablet 1  . TOUJEO SOLOSTAR 300 UNIT/ML SOPN INJECT 40 UNITS UNDER THE SKIN AS  PER INSULIN PROTOCOL 5 pen 1  . Vitamin D, Ergocalciferol, (DRISDOL) 50000 units CAPS capsule TAKE ONE CAPSULE BY MOUTH ON TUESDAYS AND FRIDAYS  0  . predniSONE (DELTASONE) 5 MG tablet   0   No current facility-administered medications for this encounter.     No Known Allergies  Social History   Socioeconomic History  . Marital status: Married    Spouse name: Not on file  . Number of children: Not on file  . Years of education: Not on file  . Highest education level: Not on file  Occupational History  . Not on file  Social Needs  . Financial resource strain: Not on file  . Food insecurity:    Worry: Not on file    Inability: Not on file  . Transportation needs:    Medical: Not on file    Non-medical: Not on file  Tobacco Use  . Smoking status: Former Games developer  . Smokeless tobacco: Never Used  Substance and Sexual Activity  . Alcohol use: No    Alcohol/week: 0.0 standard drinks  . Drug use: No  . Sexual activity: Not on file  Lifestyle  . Physical activity:    Days per week: Not on file    Minutes per session: Not on file  . Stress: Not on file  Relationships  . Social connections:    Talks on phone: Not on file    Gets together: Not on file    Attends religious service: Not on file    Active member of club or organization: Not on file    Attends meetings of clubs or organizations: Not on file    Relationship status: Not on file  . Intimate partner violence:    Fear of current or ex partner: Not on file    Emotionally abused: Not on file    Physically abused: Not on file    Forced sexual activity: Not on file  Other Topics Concern  . Not on file  Social History Narrative  . Not on file    Family History  Problem Relation Age of Onset  . Stroke Mother   . Heart Problems Father        weak heart  . Heart Problems Paternal Grandfather        pacemaker    ROS- All systems are reviewed and negative except as per the HPI above  Physical Exam: Vitals:    04/15/18 1611  BP: 120/72  Pulse: (!) 102  Weight: 112 kg  Height: 6' (1.829 m)   Wt Readings from Last 3 Encounters:  04/15/18 112 kg  04/10/18 111.9 kg  03/17/18 109.3 kg    Labs: Lab Results  Component Value Date   NA 139 04/10/2018   K 4.0 04/10/2018  CL 102 04/10/2018   CO2 21 04/10/2018   GLUCOSE 222 (H) 04/10/2018   BUN 14 04/10/2018   CREATININE 1.13 04/10/2018   CALCIUM 9.0 04/10/2018   No results found for: INR No results found for: CHOL, HDL, LDLCALC, TRIG   GEN- The patient is well appearing, alert and oriented x 3 today.   Head- normocephalic, atraumatic Eyes-  Sclera clear, conjunctiva pink Ears- hearing intact Oropharynx- clear Neck- supple, no JVP Lymph- no cervical lymphadenopathy Lungs- Clear to ausculation bilaterally, normal work of breathing Heart- irregular rate and rhythm, no murmurs, rubs or gallops, PMI not laterally displaced GI- soft, NT, ND, + BS Extremities- no clubbing, cyanosis, or edema MS- no significant deformity or atrophy Skin- no rash or lesion Psych- euthymic mood, full affect Neuro- strength and sensation are intact  EKG- atrial flutter at 102 bpm, qrs int 72 ms, qtc 479 ms Echo- 06/2017-Study Conclusions  - Left ventricle: The cavity size was normal. There was mild   concentric hypertrophy. Systolic function was at the lower limits   of normal. The estimated ejection fraction was in the range of   50% to 55%. Wall motion was normal; there were no regional wall   motion abnormalities. - Aortic valve: There was mild regurgitation. - Mitral valve: Mildly calcified annulus. - Left atrium: The atrium was moderately dilated. - Right atrium: The atrium was mildly dilated.  Impressions:  - During the study, the rhythm appears to be atrial flutter with   variable AV block.   Assessment and Plan: 1. Atrial flutter/fib with variable block Failed increase in Sotalol to maintain SR Asymptomatic, he prefers rate  control Sotalol stopped by Dr. Johney FrameAllred in July and increased rate control He has better rate control now, even better at home, v rates in the 80's Continue  Metoprolol 50 mg bid He is continuing to treat sleep apnea Weight loss encouraged  F/u in 2 months  Colin LeashDonna C. Matthew [name], ANP-C Afib Clinic [facility] [address] [place], [place] [address] [phone]

## 2018-04-18 ENCOUNTER — Other Ambulatory Visit: Payer: Self-pay

## 2018-04-27 ENCOUNTER — Other Ambulatory Visit: Payer: Self-pay | Admitting: Internal Medicine

## 2018-05-08 ENCOUNTER — Other Ambulatory Visit: Payer: Self-pay | Admitting: Internal Medicine

## 2018-05-09 ENCOUNTER — Other Ambulatory Visit: Payer: Self-pay | Admitting: Internal Medicine

## 2018-05-26 ENCOUNTER — Ambulatory Visit: Payer: Self-pay | Admitting: Internal Medicine

## 2018-05-29 ENCOUNTER — Ambulatory Visit: Payer: PRIVATE HEALTH INSURANCE | Admitting: Internal Medicine

## 2018-05-29 ENCOUNTER — Other Ambulatory Visit: Payer: Self-pay

## 2018-05-29 ENCOUNTER — Encounter: Payer: Self-pay | Admitting: Internal Medicine

## 2018-05-29 VITALS — BP 124/76 | HR 96 | Temp 97.6°F | Ht 72.0 in | Wt 240.8 lb

## 2018-05-29 DIAGNOSIS — E1122 Type 2 diabetes mellitus with diabetic chronic kidney disease: Secondary | ICD-10-CM | POA: Diagnosis not present

## 2018-05-29 DIAGNOSIS — N182 Chronic kidney disease, stage 2 (mild): Secondary | ICD-10-CM

## 2018-05-29 DIAGNOSIS — Z6832 Body mass index (BMI) 32.0-32.9, adult: Secondary | ICD-10-CM

## 2018-05-29 DIAGNOSIS — Z09 Encounter for follow-up examination after completed treatment for conditions other than malignant neoplasm: Secondary | ICD-10-CM | POA: Diagnosis not present

## 2018-05-29 DIAGNOSIS — E039 Hypothyroidism, unspecified: Secondary | ICD-10-CM | POA: Diagnosis not present

## 2018-05-29 DIAGNOSIS — E6609 Other obesity due to excess calories: Secondary | ICD-10-CM

## 2018-05-29 DIAGNOSIS — M1A4790 Other secondary chronic gout, unspecified ankle and foot, without tophus (tophi): Secondary | ICD-10-CM

## 2018-05-29 MED ORDER — DAPAGLIFLOZIN PROPANEDIOL 5 MG PO TABS: ORAL_TABLET | ORAL | 3 refills | Status: DC

## 2018-05-29 NOTE — Patient Instructions (Signed)
Diabetes Mellitus and Exercise Exercising regularly is important for your overall health, especially when you have diabetes (diabetes mellitus). Exercising is not only about losing weight. It has many other health benefits, such as increasing muscle strength and bone density and reducing body fat and stress. This leads to improved fitness, flexibility, and endurance, all of which result in better overall health. Exercise has additional benefits for people with diabetes, including:  Reducing appetite.  Helping to lower and control blood glucose.  Lowering blood pressure.  Helping to control amounts of fatty substances (lipids) in the blood, such as cholesterol and triglycerides.  Helping the body to respond better to insulin (improving insulin sensitivity).  Reducing how much insulin the body needs.  Decreasing the risk for heart disease by: ? Lowering cholesterol and triglyceride levels. ? Increasing the levels of good cholesterol. ? Lowering blood glucose levels. What is my activity plan? Your health care provider or certified diabetes educator can help you make a plan for the type and frequency of exercise (activity plan) that works for you. Make sure that you:  Do at least 150 minutes of moderate-intensity or vigorous-intensity exercise each week. This could be brisk walking, biking, or water aerobics. ? Do stretching and strength exercises, such as yoga or weightlifting, at least 2 times a week. ? Spread out your activity over at least 3 days of the week.  Get some form of physical activity every day. ? Do not go more than 2 days in a row without some kind of physical activity. ? Avoid being inactive for more than 30 minutes at a time. Take frequent breaks to walk or stretch.  Choose a type of exercise or activity that you enjoy, and set realistic goals.  Start slowly, and gradually increase the intensity of your exercise over time. What do I need to know about managing my  diabetes?   Check your blood glucose before and after exercising. ? If your blood glucose is 240 mg/dL (13.3 mmol/L) or higher before you exercise, check your urine for ketones. If you have ketones in your urine, do not exercise until your blood glucose returns to normal. ? If your blood glucose is 100 mg/dL (5.6 mmol/L) or lower, eat a snack containing 15-20 grams of carbohydrate. Check your blood glucose 15 minutes after the snack to make sure that your level is above 100 mg/dL (5.6 mmol/L) before you start your exercise.  Know the symptoms of low blood glucose (hypoglycemia) and how to treat it. Your risk for hypoglycemia increases during and after exercise. Common symptoms of hypoglycemia can include: ? Hunger. ? Anxiety. ? Sweating and feeling clammy. ? Confusion. ? Dizziness or feeling light-headed. ? Increased heart rate or palpitations. ? Blurry vision. ? Tingling or numbness around the mouth, lips, or tongue. ? Tremors or shakes. ? Irritability.  Keep a rapid-acting carbohydrate snack available before, during, and after exercise to help prevent or treat hypoglycemia.  Avoid injecting insulin into areas of the body that are going to be exercised. For example, avoid injecting insulin into: ? The arms, when playing tennis. ? The legs, when jogging.  Keep records of your exercise habits. Doing this can help you and your health care provider adjust your diabetes management plan as needed. Write down: ? Food that you eat before and after you exercise. ? Blood glucose levels before and after you exercise. ? The type and amount of exercise you have done. ? When your insulin is expected to peak, if you use   insulin. Avoid exercising at times when your insulin is peaking.  When you start a new exercise or activity, work with your health care provider to make sure the activity is safe for you, and to adjust your insulin, medicines, or food intake as needed.  Drink plenty of water while  you exercise to prevent dehydration or heat stroke. Drink enough fluid to keep your urine clear or pale yellow. Summary  Exercising regularly is important for your overall health, especially when you have diabetes (diabetes mellitus).  Exercising has many health benefits, such as increasing muscle strength and bone density and reducing body fat and stress.  Your health care provider or certified diabetes educator can help you make a plan for the type and frequency of exercise (activity plan) that works for you.  When you start a new exercise or activity, work with your health care provider to make sure the activity is safe for you, and to adjust your insulin, medicines, or food intake as needed. This information is not intended to replace advice given to you by your health care provider. Make sure you discuss any questions you have with your health care provider. Document Released: 07/14/2003 Document Revised: 11/01/2016 Document Reviewed: 10/03/2015 Elsevier Interactive Patient Education  2019 Elsevier Inc.  

## 2018-05-30 LAB — BMP8+EGFR
BUN/Creatinine Ratio: 10 (ref 10–24)
BUN: 10 mg/dL (ref 8–27)
CALCIUM: 9.8 mg/dL (ref 8.6–10.2)
CHLORIDE: 98 mmol/L (ref 96–106)
CO2: 26 mmol/L (ref 20–29)
Creatinine, Ser: 1.03 mg/dL (ref 0.76–1.27)
GFR, EST AFRICAN AMERICAN: 90 mL/min/{1.73_m2} (ref >59)
GFR, EST NON AFRICAN AMERICAN: 78 mL/min/{1.73_m2} (ref >59)
Glucose: 211 mg/dL — ABNORMAL HIGH (ref 65–99)
Potassium: 4.6 mmol/L (ref 3.5–5.2)
Sodium: 138 mmol/L (ref 134–144)

## 2018-05-30 LAB — T4, FREE: Free T4: 1.73 ng/dL (ref 0.82–1.77)

## 2018-05-30 LAB — URIC ACID: URIC ACID: 7.6 mg/dL (ref 3.7–8.6)

## 2018-05-30 LAB — TSH: TSH: 0.633 u[IU]/mL (ref 0.450–4.500)

## 2018-05-31 NOTE — Progress Notes (Signed)
Subjective:     Patient ID: Colin [name] , male    DOB: [birth year] , 63 y.o.   MRN: [id number]   Chief Complaint  Patient presents with  . [place] f/u    HPI  He is here today for f/u [place].  He was started on 12m Farxiga at his last visit. He did not have any problems with the medication. He has been on Jardiance in the past, but it caused a terrible yeast infection/balanitis.    Past Medical History:  Diagnosis Date  . Atrial flutter ([place]   . DCM (dilated [place]) ([place]    EF 35-40% years ago but echo a year ago showed normal LVF  . Dilated aortic root ([place] 04/15/2015   420mby echo 06/2017 and Chest CT 12/2016  . Hypertension   . Obesities, morbid ([place]  . OSA (obstructive sleep apnea)   . Persistent atrial fibrillation    s/p TEE/DCCV now on Sotolol     Family History  Problem Relation Age of Onset  . Stroke Mother   . Heart Problems Father        weak heart  . Heart Problems Paternal Grandfather        pacemaker     Current Outpatient Medications:  .  allopurinol (ZYLOPRIM) 100 MG tablet, Take 200 mg by mouth at bedtime., Disp: , Rfl:  .  atorvastatin (LIPITOR) 80 MG tablet, TAKE 1 TABLET BY MOUTH EVERY DAY, Disp: 90 tablet, Rfl: 1 .  CIALIS 20 MG tablet, Take 1 tablet by mouth daily as needed., Disp: , Rfl: 3 .  colchicine 0.6 MG tablet, Take 0.6 mg by mouth daily as needed. colcrys, Disp: , Rfl:  .  dapagliflozin propanediol (FARXIGA) 5 MG TABS tablet, Take 1 tablet by mouth daily, Disp: 30 tablet, Rfl: 3 .  ELIQUIS 5 MG TABS tablet, TAKE 1 TABLET(5 MG) BY MOUTH TWICE DAILY, Disp: 60 tablet, Rfl: 6 .  levothyroxine (SYNTHROID, LEVOTHROID) 200 MCG tablet, TAKE 1 TABLET BY MOUTH EVERY DAY, Disp: 90 tablet, Rfl: 1 .  metoprolol tartrate (LOPRESSOR) 50 MG tablet, Take 1 tablet (50 mg total) by mouth 2 (two) times daily., Disp: 60 tablet, Rfl: 3 .  Multiple Vitamins-Minerals (CENTRUM SILVER ADULT 50+ PO), Take 1 tablet by mouth daily. , Disp: , Rfl:  .   NOVOTWIST 32G X 5 MM MISC, See admin instructions., Disp: , Rfl: 3 .  nystatin cream (MYCOSTATIN), Apply 1 application topically 2 (two) times daily., Disp: , Rfl:  .  OZEMPIC, 0.25 OR 0.5 MG/DOSE, 2 MG/1.5ML SOPN, INJECT 0.5 MG UNDER THE SKIN EVERY WEEK ON THE SAME DAY OF EACH WEEK, IN THE ABDOMEN, THIGH OR UPPER ARM ROTATING INJECTION SITES, Disp: 1 mL, Rfl: 1 .  predniSONE (DELTASONE) 5 MG tablet, , Disp: , Rfl: 0 .  TOUJEO SOLOSTAR 300 UNIT/ML SOPN, INJECT 40 UNITS UNDER THE SKIN AS PER INSULIN PROTOCOL, Disp: 5 pen, Rfl: 1 .  Vitamin D, Ergocalciferol, (DRISDOL) 1.25 MG (50000 UT) CAPS capsule, Take 50,000 Units by mouth every 7 (seven) days., Disp: , Rfl:    No Known Allergies   Review of Systems  Constitutional: Negative.   Respiratory: Negative.   Cardiovascular: Negative.   Gastrointestinal: Negative.   Neurological: Negative.   Psychiatric/Behavioral: Negative.      Today's Vitals   05/29/18 1542  BP: 124/76  Pulse: 96  Temp: 97.6 F (36.4 C)  TempSrc: Oral  Weight: 240 lb 12.8 oz (109.2 kg)  Height: 6' (  1.829 m)   Body mass index is 32.66 kg/m.   Objective:  Physical Exam Vitals signs reviewed.  Constitutional:      Appearance: Normal appearance. He is obese.  HENT:     Head: Normocephalic and atraumatic.  Cardiovascular:     Rate and Rhythm: Normal rate. Rhythm irregular.     Heart sounds: Normal heart sounds.  Pulmonary:     Effort: Pulmonary effort is normal.     Breath sounds: Normal breath sounds.  Neurological:     General: No focal deficit present.     Mental Status: He is alert.  Psychiatric:        Mood and Affect: Mood normal.         Assessment And Plan:     1. Diabetes mellitus with stage 2 chronic kidney disease ([place])  He will continue with Farxiga 50m once daily.  I will check a bmet today to ensure kidney function is stable. He is encouraged to stay well hydrated while on this medication.   - BMP8+EGFR  2. Primary  hypothyroidism  I will check a thyroid panel and adjust meds as needed.   - TSH - T4, Free  3. 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 I will check uric acid level.   - Uric acid  4. Class 1 obesity due to excess calories with serious comorbidity and body mass index (BMI) of 32.0 to 32.9 in adult  He is encouraged to strive for BMI less than 29 to decrease cardiac risk. Importance of regular exercise was discussed with the patient. He is encouraged to strive for 30 minutes of exercise five days weekly.  He is also advised to avoid sugary beverages and processed/packaged foods.   RMaximino Greenland MD

## 2018-06-04 ENCOUNTER — Telehealth: Payer: Self-pay

## 2018-06-04 ENCOUNTER — Other Ambulatory Visit: Payer: Self-pay

## 2018-06-04 MED ORDER — LEVOTHYROXINE SODIUM 200 MCG PO TABS: ORAL_TABLET | ORAL | 1 refills | Status: AC

## 2018-06-04 NOTE — Telephone Encounter (Signed)
Left the pt a message to call back for his lab results. 

## 2018-06-04 NOTE — Telephone Encounter (Signed)
-----   Message from Dorothyann Peng, MD sent at 05/31/2018  3:36 PM EST ----- Your kidney fxn is nl. Your thyroid fxn is on low end of normal.  If you are taking Sun-Sat, pls start M-Sat and take 1/2 pill on sundays.  Your uric acid levels are elevated. Are you taking allopurinol daily? If yes, pls confirm dose.

## 2018-06-05 ENCOUNTER — Other Ambulatory Visit: Payer: Self-pay

## 2018-06-05 MED ORDER — ALLOPURINOL 100 MG PO TABS: 100.0000 mg | ORAL_TABLET | Freq: Every day | ORAL | 1 refills | Status: DC

## 2018-06-27 ENCOUNTER — Ambulatory Visit (HOSPITAL_COMMUNITY): Payer: PRIVATE HEALTH INSURANCE | Attending: Cardiovascular Disease

## 2018-06-27 ENCOUNTER — Telehealth: Payer: Self-pay

## 2018-06-27 DIAGNOSIS — I77819 Aortic ectasia, unspecified site: Secondary | ICD-10-CM | POA: Insufficient documentation

## 2018-06-27 DIAGNOSIS — I5022 Chronic systolic (congestive) heart failure: Secondary | ICD-10-CM

## 2018-06-27 DIAGNOSIS — I77810 Thoracic aortic ectasia: Secondary | ICD-10-CM

## 2018-06-27 NOTE — Telephone Encounter (Signed)
Spoke with the patient, he expressed understanding about getting a CTA completed. He had no further questions.

## 2018-06-27 NOTE — Telephone Encounter (Signed)
-----   Message from Quintella Reichert, MD sent at 06/27/2018  2:10 PM EST ----- Please let patient know that echo showed low normal LV function with EF 50 to 55% with mild to moderately enlarged LA.  No change from prior echo

## 2018-06-27 NOTE — Telephone Encounter (Signed)
Notes recorded by Quintella Reichert, MD on 06/27/2018 at 2:12 PM EST His 2D echocardiogram did not mention the a sending aortic diameter which had been elevated before. I would like him to have a chest CT angios to evaluate his a sending aortic aneurysm.

## 2018-07-02 ENCOUNTER — Other Ambulatory Visit: Payer: Self-pay | Admitting: Internal Medicine

## 2018-07-14 ENCOUNTER — Encounter: Payer: Self-pay | Admitting: Internal Medicine

## 2018-07-14 ENCOUNTER — Ambulatory Visit: Payer: PRIVATE HEALTH INSURANCE | Admitting: Internal Medicine

## 2018-07-14 VITALS — BP 120/84 | HR 94 | Temp 97.7°F | Ht 72.0 in | Wt 239.6 lb

## 2018-07-14 DIAGNOSIS — I483 Typical atrial flutter: Secondary | ICD-10-CM

## 2018-07-14 DIAGNOSIS — E1122 Type 2 diabetes mellitus with diabetic chronic kidney disease: Secondary | ICD-10-CM

## 2018-07-14 DIAGNOSIS — I1310 Hypertensive heart and chronic kidney disease without heart failure, with stage 1 through stage 4 chronic kidney disease, or unspecified chronic kidney disease: Secondary | ICD-10-CM

## 2018-07-14 DIAGNOSIS — E6609 Other obesity due to excess calories: Secondary | ICD-10-CM

## 2018-07-14 DIAGNOSIS — N182 Chronic kidney disease, stage 2 (mild): Secondary | ICD-10-CM | POA: Diagnosis not present

## 2018-07-14 DIAGNOSIS — Z6832 Body mass index (BMI) 32.0-32.9, adult: Secondary | ICD-10-CM

## 2018-07-14 DIAGNOSIS — E039 Hypothyroidism, unspecified: Secondary | ICD-10-CM

## 2018-07-14 NOTE — Patient Instructions (Signed)

## 2018-07-14 NOTE — Progress Notes (Signed)
Subjective:     Patient ID: Colin [name] , male    DOB: 03-14-1956 , 63 y.o.   MRN: [id number]   Chief Complaint  Patient presents with  . Diabetes  . Hypertension    HPI  Diabetes  He presents for his follow-up diabetic visit. He has type 2 diabetes mellitus. His disease course has been improving. There are no hypoglycemic associated symptoms. Pertinent negatives for diabetes include no blurred vision and no chest pain. There are no hypoglycemic complications. Diabetic complications include nephropathy. Risk factors for coronary artery disease include diabetes mellitus, dyslipidemia, hypertension, sedentary lifestyle, male sex and obesity. He participates in exercise intermittently. His breakfast blood glucose is taken between 8-9 am. His breakfast blood glucose range is generally 140-180 mg/dl. An ACE inhibitor/angiotensin II receptor blocker is being taken. Eye exam is current.  Hypertension  This is a chronic problem. The current episode started more than 1 year ago. The problem has been gradually improving since onset. The problem is controlled. Pertinent negatives include no blurred vision, chest pain, palpitations or shortness of breath.   He reports compliance with meds.   Past Medical History:  Diagnosis Date  . Atrial flutter ([place])   . DCM (dilated cardiomyopathy) ([place])    EF 35-40% years ago but echo a year ago showed normal LVF  . Dilated aortic root ([place]) 04/15/2015   23m by echo 06/2017 and Chest CT 12/2016  . Hypertension   . Obesities, morbid ([place]   . OSA (obstructive sleep apnea)   . Persistent atrial fibrillation    s/p TEE/DCCV now on Sotolol     Family History  Problem Relation Age of Onset  . Stroke Mother   . Heart Problems Father        weak heart  . Heart Problems Paternal Grandfather        pacemaker     Current Outpatient Medications:  .  allopurinol (ZYLOPRIM) 100 MG tablet, Take 1 tablet (100 mg total) by mouth at bedtime., Disp: 30 tablet,  Rfl: 1 .  atorvastatin (LIPITOR) 80 MG tablet, TAKE 1 TABLET BY MOUTH EVERY DAY, Disp: 90 tablet, Rfl: 1 .  CIALIS 20 MG tablet, Take 1 tablet by mouth daily as needed., Disp: , Rfl: 3 .  colchicine 0.6 MG tablet, Take 0.6 mg by mouth daily as needed. colcrys, Disp: , Rfl:  .  dapagliflozin propanediol (FARXIGA) 5 MG TABS tablet, Take 1 tablet by mouth daily, Disp: 30 tablet, Rfl: 3 .  ELIQUIS 5 MG TABS tablet, TAKE 1 TABLET(5 MG) BY MOUTH TWICE DAILY, Disp: 60 tablet, Rfl: 6 .  levothyroxine (SYNTHROID, LEVOTHROID) 200 MCG tablet, Take 1 tablet by mouth Monday - Saturday and 1/2 tablet on Sunday, Disp: 90 tablet, Rfl: 1 .  metoprolol tartrate (LOPRESSOR) 50 MG tablet, Take 1 tablet (50 mg total) by mouth 2 (two) times daily., Disp: 60 tablet, Rfl: 3 .  Multiple Vitamins-Minerals (CENTRUM SILVER ADULT 50+ PO), Take 1 tablet by mouth daily. , Disp: , Rfl:  .  NOVOTWIST 32G X 5 MM MISC, See admin instructions., Disp: , Rfl: 3 .  nystatin cream (MYCOSTATIN), Apply 1 application topically 2 (two) times daily., Disp: , Rfl:  .  OZEMPIC, 0.25 OR 0.5 MG/DOSE, 2 MG/1.5ML SOPN, INJECT 0.5 MG UNDER THE SKIN EVERY WEEK ON THE SAME DAY OF EACH WEEK, IN THE ABDOMEN, THIGH, OR UPPER ARM ROTATING INJECTION SITES, Disp: 1 pen, Rfl: 2 .  predniSONE (DELTASONE) 5 MG tablet, , Disp: ,  Rfl: 0 .  TOUJEO SOLOSTAR 300 UNIT/ML SOPN, INJECT 40 UNITS UNDER THE SKIN AS PER INSULIN PROTOCOL, Disp: 5 pen, Rfl: 1 .  Vitamin D, Ergocalciferol, (DRISDOL) 1.25 MG (50000 UT) CAPS capsule, Take 50,000 Units by mouth every 7 (seven) days., Disp: , Rfl:    No Known Allergies   Review of Systems  Constitutional: Negative.   Eyes: Negative for blurred vision.  Respiratory: Negative.  Negative for shortness of breath.   Cardiovascular: Negative.  Negative for chest pain and palpitations.  Gastrointestinal: Negative.   Neurological: Negative.   Psychiatric/Behavioral: Negative.      Today's Vitals   07/14/18 1611  BP: 120/84   Pulse: 94  Temp: 97.7 F (36.5 C)  TempSrc: Oral  Weight: 239 lb 9.6 oz (108.7 kg)  Height: 6' (1.829 m)   Body mass index is 32.5 kg/m.   Objective:  Physical Exam Vitals signs and nursing note reviewed.  Constitutional:      Appearance: Normal appearance.  Cardiovascular:     Rate and Rhythm: Normal rate. Rhythm irregular.     Heart sounds: Normal heart sounds.  Pulmonary:     Effort: Pulmonary effort is normal.     Breath sounds: Normal breath sounds.  Skin:    General: Skin is warm.  Neurological:     General: No focal deficit present.     Mental Status: He is alert.  Psychiatric:        Mood and Affect: Mood normal.         Assessment And Plan:     1. Diabetes mellitus with stage 2 chronic kidney disease ([place])  I will check labs as listed below. Importance of regular exercise was discussed with the patient. I will adjust meds as needed.   - BMP8+EGFR - Hemoglobin A1c - Lipid panel  2. Hypertensive heart and renal disease with renal failure, stage 1 through stage 4 or unspecified chronic kidney disease, without heart failure  Well controlled. He will continue with current meds. He is encouraged to limit his salt intake.   3. Typical atrial flutter ([facility])  Chronic, yet stable.   4. Primary hypothyroidism  I will check thyroid panel and adjust meds as needed.  - TSH - T4, Free  5. Class 1 obesity due to excess calories with serious comorbidity and body mass index (BMI) of 32.0 to 32.9 in adult  He is encouraged to strive for weight of 215 pounds as his initial goal.  Importance of achieving optimal weight to decrease risk of cardiovascular disease and cancers was discussed with the patient in full detail. He is encouraged to start slowly - start with 10 minutes twice daily at least three to four days per week and to gradually build to 30 minutes five days weekly. He was given tips to incorporate more activity into her daily routine - take stairs when  possible, park farther away from his job, grocery stores, etc.   Maximino Greenland, MD

## 2018-07-15 ENCOUNTER — Other Ambulatory Visit: Payer: No Typology Code available for payment source

## 2018-07-15 LAB — T4, FREE: Free T4: 1.17 ng/dL (ref 0.82–1.77)

## 2018-07-15 LAB — LIPID PANEL
CHOL/HDL RATIO: 4.7 ratio (ref 0.0–5.0)
Cholesterol, Total: 206 mg/dL — ABNORMAL HIGH (ref 100–199)
HDL: 44 mg/dL (ref >39)
LDL Calculated: 141 mg/dL — ABNORMAL HIGH (ref 0–99)
Triglycerides: 104 mg/dL (ref 0–149)
VLDL CHOLESTEROL CAL: 21 mg/dL (ref 5–40)

## 2018-07-15 LAB — TSH: TSH: 0.342 u[IU]/mL — AB (ref 0.450–4.500)

## 2018-07-15 LAB — BMP8+EGFR
BUN / CREAT RATIO: 12 (ref 10–24)
BUN: 13 mg/dL (ref 8–27)
CO2: 19 mmol/L — ABNORMAL LOW (ref 20–29)
Calcium: 9.4 mg/dL (ref 8.6–10.2)
Chloride: 106 mmol/L (ref 96–106)
Creatinine, Ser: 1.11 mg/dL (ref 0.76–1.27)
GFR calc Af Amer: 82 mL/min/{1.73_m2} (ref >59)
GFR calc non Af Amer: 71 mL/min/{1.73_m2} (ref >59)
GLUCOSE: 148 mg/dL — AB (ref 65–99)
POTASSIUM: 3.9 mmol/L (ref 3.5–5.2)
Sodium: 144 mmol/L (ref 134–144)

## 2018-07-15 LAB — HEMOGLOBIN A1C
ESTIMATED AVERAGE GLUCOSE: 223 mg/dL
HEMOGLOBIN A1C: 9.4 % — AB (ref 4.8–5.6)

## 2018-07-16 ENCOUNTER — Other Ambulatory Visit: Payer: No Typology Code available for payment source | Admitting: *Deleted

## 2018-07-16 ENCOUNTER — Other Ambulatory Visit: Payer: Self-pay

## 2018-07-16 DIAGNOSIS — I77810 Thoracic aortic ectasia: Secondary | ICD-10-CM

## 2018-07-16 DIAGNOSIS — I5022 Chronic systolic (congestive) heart failure: Secondary | ICD-10-CM

## 2018-07-17 LAB — BASIC METABOLIC PANEL
BUN/Creatinine Ratio: 11 (ref 10–24)
BUN: 13 mg/dL (ref 8–27)
CO2: 21 mmol/L (ref 20–29)
Calcium: 9.6 mg/dL (ref 8.6–10.2)
Chloride: 103 mmol/L (ref 96–106)
Creatinine, Ser: 1.15 mg/dL (ref 0.76–1.27)
GFR calc Af Amer: 79 mL/min/{1.73_m2} (ref >59)
GFR calc non Af Amer: 68 mL/min/{1.73_m2} (ref >59)
Glucose: 106 mg/dL — ABNORMAL HIGH (ref 65–99)
Potassium: 3.9 mmol/L (ref 3.5–5.2)
Sodium: 139 mmol/L (ref 134–144)

## 2018-07-22 ENCOUNTER — Ambulatory Visit (INDEPENDENT_AMBULATORY_CARE_PROVIDER_SITE_OTHER)
Admission: RE | Admit: 2018-07-22 | Discharge: 2018-07-22 | Disposition: A | Discharge status: Home / Self Care | Payer: No Typology Code available for payment source | Source: Ambulatory Visit | Attending: Cardiology | Admitting: Cardiology

## 2018-07-22 DIAGNOSIS — I77810 Thoracic aortic ectasia: Secondary | ICD-10-CM

## 2018-07-22 MED ORDER — IOHEXOL 350 MG/ML SOLN
100.0000 mL | Freq: Once | INTRAVENOUS | Status: AC | PRN
  Administered 2018-07-22: 100 mL via INTRAVENOUS

## 2018-07-24 ENCOUNTER — Telehealth: Payer: Self-pay

## 2018-07-24 DIAGNOSIS — I712 Thoracic aortic aneurysm, without rupture, unspecified: Secondary | ICD-10-CM

## 2018-07-24 NOTE — Telephone Encounter (Signed)
-----   Message from Quintella Reichert, MD sent at 07/23/2018  5:00 PM EDT ----- Chest CTA showed mild ascending aortic aneurysm at 4cm .  Stable from 2018.  Please get cardiac MRI/MRA in 1 year for followup on aorta

## 2018-07-24 NOTE — Telephone Encounter (Signed)
Spoke with the patient, he expressed understanding about his results. He had no further questions. Cardiac MRA was scheduled for 1 year, around 07/24/19.

## 2018-08-04 ENCOUNTER — Telehealth: Payer: Self-pay

## 2018-08-04 NOTE — Telephone Encounter (Signed)
The patient was told that Dr. Allyne Gee said she needed the pt to call in his fasting sugar readings so that she can adjust his diabetes medications accordingly.

## 2018-08-10 ENCOUNTER — Other Ambulatory Visit: Payer: Self-pay | Admitting: Nurse Practitioner

## 2018-08-12 ENCOUNTER — Other Ambulatory Visit: Payer: Self-pay

## 2018-08-12 ENCOUNTER — Telehealth: Payer: Self-pay

## 2018-08-12 MED ORDER — INSULIN GLARGINE (1 UNIT DIAL) 300 UNIT/ML ~~LOC~~ SOPN: 42.0000 [IU] | PEN_INJECTOR | Freq: Every day | SUBCUTANEOUS | 1 refills | Status: DC

## 2018-08-12 NOTE — Telephone Encounter (Signed)
The patient called and left a message for his morning sugar readings for last week. Monday 210, Tuesday 169, Wednesday 161,Thursday 149.   The patient was told that Dr.Sanders said to increase his insulin by 2 units.  The pt confirmed that he would be at 42 units.Marland Kitchen

## 2018-08-28 ENCOUNTER — Other Ambulatory Visit: Payer: Self-pay | Admitting: Internal Medicine

## 2018-08-31 ENCOUNTER — Other Ambulatory Visit: Payer: Self-pay | Admitting: Internal Medicine

## 2018-09-01 NOTE — Telephone Encounter (Signed)
Eliquis 5mg  refill request received; pt is 63 yrs old, wt-108.7kg, Crea-1.15 on 07/16/2018, last seen by Rudi Coco on 04/15/2018; refill sent.

## 2018-09-05 ENCOUNTER — Other Ambulatory Visit: Payer: Self-pay | Admitting: Internal Medicine

## 2018-09-11 ENCOUNTER — Other Ambulatory Visit: Payer: No Typology Code available for payment source

## 2018-09-22 ENCOUNTER — Other Ambulatory Visit: Payer: Self-pay | Admitting: Cardiology

## 2018-09-23 ENCOUNTER — Other Ambulatory Visit: Payer: No Typology Code available for payment source

## 2018-10-10 ENCOUNTER — Telehealth: Payer: Self-pay | Admitting: *Deleted

## 2018-10-10 NOTE — Telephone Encounter (Signed)
-----   Message from Dustin Flock, RN sent at 10/10/2018 12:46 PM EDT ----- Regarding: FW: need new CPAP equipment The patient has a question about CPAP equipment.  ----- Message ----- From: Mathews Robinsons Sent: 10/10/2018  12:37 PM EDT To: Dustin Flock, RN Subject: need new CPAP equipment                        Pt is aware MR MRA CHEST scheduled for October 11, 2018 is cancelled. [facility] is aware to cancel testing until 2021. [facility] unable to defer until 2021; need new order 2021.  Pt has question WU:JWJX equipment   Thanks renee

## 2018-10-10 NOTE — Telephone Encounter (Signed)
Return call: Patient states his cpap machine is leaking water and wanted a new one. When looking at his initial set up date 01/20/2015 he has not had his machine 5 years and does not qualify for a new one. Patient was encouraged to call [facility] explain his machine problem and maybe they can fix the problem or give him a loaner machine until he is able to get a new one. Patient agrees with treatment and thanked me for call.

## 2018-10-11 ENCOUNTER — Other Ambulatory Visit: Payer: No Typology Code available for payment source

## 2018-10-14 ENCOUNTER — Encounter: Payer: PRIVATE HEALTH INSURANCE | Admitting: Internal Medicine

## 2018-10-28 ENCOUNTER — Other Ambulatory Visit: Payer: Self-pay

## 2018-10-28 MED ORDER — COLCHICINE 0.6 MG PO TABS: 0.6000 mg | ORAL_TABLET | Freq: Every day | ORAL | 0 refills | Status: DC | PRN

## 2018-11-03 ENCOUNTER — Encounter: Payer: PRIVATE HEALTH INSURANCE | Admitting: Internal Medicine

## 2018-11-30 ENCOUNTER — Other Ambulatory Visit: Payer: Self-pay | Admitting: Internal Medicine

## 2018-12-06 ENCOUNTER — Other Ambulatory Visit: Payer: Self-pay | Admitting: Internal Medicine

## 2018-12-08 ENCOUNTER — Other Ambulatory Visit: Payer: Self-pay

## 2018-12-08 ENCOUNTER — Encounter: Payer: Self-pay | Admitting: Internal Medicine

## 2018-12-08 ENCOUNTER — Other Ambulatory Visit (HOSPITAL_COMMUNITY): Payer: Self-pay | Admitting: *Deleted

## 2018-12-08 ENCOUNTER — Ambulatory Visit (INDEPENDENT_AMBULATORY_CARE_PROVIDER_SITE_OTHER): Payer: PRIVATE HEALTH INSURANCE | Admitting: Internal Medicine

## 2018-12-08 VITALS — BP 110/70 | HR 85 | Temp 98.1°F | Ht 71.0 in | Wt 243.2 lb

## 2018-12-08 DIAGNOSIS — E6609 Other obesity due to excess calories: Secondary | ICD-10-CM

## 2018-12-08 DIAGNOSIS — Z1211 Encounter for screening for malignant neoplasm of colon: Secondary | ICD-10-CM

## 2018-12-08 DIAGNOSIS — I1310 Hypertensive heart and chronic kidney disease without heart failure, with stage 1 through stage 4 chronic kidney disease, or unspecified chronic kidney disease: Secondary | ICD-10-CM

## 2018-12-08 DIAGNOSIS — N182 Chronic kidney disease, stage 2 (mild): Secondary | ICD-10-CM | POA: Diagnosis not present

## 2018-12-08 DIAGNOSIS — I483 Typical atrial flutter: Secondary | ICD-10-CM

## 2018-12-08 DIAGNOSIS — Z6833 Body mass index (BMI) 33.0-33.9, adult: Secondary | ICD-10-CM

## 2018-12-08 DIAGNOSIS — Z0000 Encounter for general adult medical examination without abnormal findings: Secondary | ICD-10-CM | POA: Diagnosis not present

## 2018-12-08 DIAGNOSIS — E1122 Type 2 diabetes mellitus with diabetic chronic kidney disease: Secondary | ICD-10-CM | POA: Diagnosis not present

## 2018-12-08 LAB — POCT UA - MICROALBUMIN
Albumin/Creatinine Ratio, Urine, POC: <30
Creatinine, POC: 200 mg/dL
Microalbumin Ur, POC: 30 mg/L

## 2018-12-08 LAB — POCT URINALYSIS DIPSTICK
Bilirubin, UA: NEGATIVE
Blood, UA: NEGATIVE
Glucose, UA: NEGATIVE
Ketones, UA: NEGATIVE
Leukocytes, UA: NEGATIVE
Nitrite, UA: NEGATIVE
Protein, UA: NEGATIVE
Spec Grav, UA: 1.025 (ref 1.010–1.025)
Urobilinogen, UA: 0.2 E.U./dL
pH, UA: 5 (ref 5.0–8.0)

## 2018-12-08 MED ORDER — SILDENAFIL CITRATE 100 MG PO TABS: 100.0000 mg | ORAL_TABLET | Freq: Every day | ORAL | 1 refills | Status: DC | PRN

## 2018-12-08 MED ORDER — APIXABAN 5 MG PO TABS: ORAL_TABLET | ORAL | 6 refills | Status: DC

## 2018-12-08 MED ORDER — METOPROLOL TARTRATE 50 MG PO TABS: 50.0000 mg | ORAL_TABLET | Freq: Two times a day (BID) | ORAL | 2 refills | Status: DC

## 2018-12-08 NOTE — Patient Instructions (Signed)

## 2018-12-09 LAB — CMP14+EGFR
ALT: 61 IU/L — ABNORMAL HIGH (ref 0–44)
AST: 36 IU/L (ref 0–40)
Albumin/Globulin Ratio: 1.7 (ref 1.2–2.2)
Albumin: 4.4 g/dL (ref 3.8–4.8)
Alkaline Phosphatase: 128 IU/L — ABNORMAL HIGH (ref 39–117)
BUN/Creatinine Ratio: 14 (ref 10–24)
BUN: 14 mg/dL (ref 8–27)
Bilirubin Total: 0.4 mg/dL (ref 0.0–1.2)
CO2: 20 mmol/L (ref 20–29)
Calcium: 9.7 mg/dL (ref 8.6–10.2)
Chloride: 103 mmol/L (ref 96–106)
Creatinine, Ser: 1.03 mg/dL (ref 0.76–1.27)
GFR calc Af Amer: 90 mL/min/{1.73_m2} (ref >59)
GFR calc non Af Amer: 77 mL/min/{1.73_m2} (ref >59)
Globulin, Total: 2.6 g/dL (ref 1.5–4.5)
Glucose: 172 mg/dL — ABNORMAL HIGH (ref 65–99)
Potassium: 4.1 mmol/L (ref 3.5–5.2)
Sodium: 141 mmol/L (ref 134–144)
Total Protein: 7 g/dL (ref 6.0–8.5)

## 2018-12-09 LAB — CBC
Hematocrit: 43.8 % (ref 37.5–51.0)
Hemoglobin: 14.9 g/dL (ref 13.0–17.7)
MCH: 29 pg (ref 26.6–33.0)
MCHC: 34 g/dL (ref 31.5–35.7)
MCV: 85 fL (ref 79–97)
Platelets: 197 10*3/uL (ref 150–450)
RBC: 5.14 x10E6/uL (ref 4.14–5.80)
RDW: 14.8 % (ref 11.6–15.4)
WBC: 6.5 10*3/uL (ref 3.4–10.8)

## 2018-12-09 LAB — HEMOGLOBIN A1C
Est. average glucose Bld gHb Est-mCnc: 217 mg/dL
Hgb A1c MFr Bld: 9.2 % — ABNORMAL HIGH (ref 4.8–5.6)

## 2018-12-10 ENCOUNTER — Telehealth: Payer: Self-pay

## 2018-12-10 NOTE — Telephone Encounter (Signed)
Left the patient a message to call back for lab results. 

## 2018-12-10 NOTE — Progress Notes (Signed)
Subjective:     Patient ID: Colin [name] , male    DOB: 1955/07/16 , 63 y.o.   MRN: [id number]   Chief Complaint  Patient presents with  . Annual Exam  . Diabetes  . Hypertension    HPI  He is here today for a full physical examination. He has no specific concerns or complaints at this time.  Diabetes He presents for his follow-up diabetic visit. He has type 2 diabetes mellitus. His disease course has been improving. There are no hypoglycemic associated symptoms. Pertinent negatives for diabetes include no blurred vision and no chest pain. There are no hypoglycemic complications. Diabetic complications include nephropathy. Risk factors for coronary artery disease include diabetes mellitus, dyslipidemia, hypertension, sedentary lifestyle, male sex and obesity. He is following a generally unhealthy diet. When asked about meal planning, he reported none. He participates in exercise intermittently. His breakfast blood glucose is taken between 8-9 am. His breakfast blood glucose range is generally 140-180 mg/dl. An ACE inhibitor/angiotensin II receptor blocker is being taken. Eye exam is current.  Hypertension This is a chronic problem. The current episode started more than 1 year ago. The problem has been gradually improving since onset. The problem is controlled. Pertinent negatives include no blurred vision, chest pain, palpitations or shortness of breath. Risk factors for coronary artery disease include diabetes mellitus, dyslipidemia, obesity, male gender and sedentary lifestyle. The current treatment provides moderate improvement. Compliance problems include exercise.      Past Medical History:  Diagnosis Date  . Atrial flutter ([place])   . DCM (dilated cardiomyopathy) ([place])    EF 35-40% years ago but echo a year ago showed normal LVF  . Dilated aortic root ([place]) 04/15/2015   41m by echo 06/2017 and Chest CT 12/2016  . Hypertension   . Obesities, morbid ([place]   . OSA (obstructive sleep  apnea)   . Persistent atrial fibrillation    s/p TEE/DCCV now on Sotolol     Family History  Problem Relation Age of Onset  . Stroke Mother   . Heart Problems Father        weak heart  . Heart Problems Paternal Grandfather        pacemaker     Current Outpatient Medications:  .  allopurinol (ZYLOPRIM) 100 MG tablet, Take 1 tablet (100 mg total) by mouth at bedtime., Disp: 30 tablet, Rfl: 1 .  apixaban (ELIQUIS) 5 MG TABS tablet, TAKE 1 TABLET(5 MG) BY MOUTH TWICE DAILY, Disp: 60 tablet, Rfl: 6 .  atorvastatin (LIPITOR) 80 MG tablet, TAKE 1 TABLET BY MOUTH EVERY DAY, Disp: 90 tablet, Rfl: 1 .  colchicine 0.6 MG tablet, Take 1 tablet (0.6 mg total) by mouth daily as needed. colcrys, Disp: 30 tablet, Rfl: 0 .  dapagliflozin propanediol (FARXIGA) 5 MG TABS tablet, Take 1 tablet by mouth daily, Disp: 30 tablet, Rfl: 3 .  levothyroxine (SYNTHROID, LEVOTHROID) 200 MCG tablet, Take 1 tablet by mouth Monday - Saturday and 1/2 tablet on Sunday, Disp: 90 tablet, Rfl: 1 .  metoprolol tartrate (LOPRESSOR) 50 MG tablet, Take 1 tablet (50 mg total) by mouth 2 (two) times daily., Disp: 180 tablet, Rfl: 2 .  Multiple Vitamins-Minerals (CENTRUM SILVER ADULT 50+ PO), Take 1 tablet by mouth daily. , Disp: , Rfl:  .  NOVOTWIST 32G X 5 MM MISC, See admin instructions., Disp: , Rfl: 3 .  sildenafil (VIAGRA) 100 MG tablet, Take 1 tablet (100 mg total) by mouth daily as needed for erectile dysfunction.,  Disp: 10 tablet, Rfl: 1 .  TOUJEO SOLOSTAR 300 UNIT/ML SOPN, INJECT 40 UNITS UNDER THE SKIN AS PER INSULIN PROTOCOL, Disp: 4.5 mL, Rfl: 4 .  OZEMPIC, 0.25 OR 0.5 MG/DOSE, 2 MG/1.5ML SOPN, INJECT 0.5 MG UNDER THE SKIN EVERY WEEK ON THE SAME DAY OF EACH WEEK, IN THE ABDOMINAL, THIGH, OR UPPER ARM ROTATING INJECTION SITES, Disp: 1.5 mL, Rfl: 5   No Known Allergies   Men's preventive visit. Patient Health Questionnaire (PHQ-2) is    Office Visit from 12/08/2018 in [facility]  PHQ-2 Total  Score  0    . Patient is on a "healthy" diet. He admits he is still drinking 1-2 diet ginger ales per day.  Marital status: Married. Relevant history for alcohol use is:  Social History   Substance and Sexual Activity  Alcohol Use No  . Alcohol/week: 0.0 standard drinks  . Relevant history for tobacco use is:  Social History   Tobacco Use  Smoking Status Former Smoker  . Packs/day: 0.50  . Years: 15.00  . Pack years: 7.50  . Types: Cigarettes  Smokeless Tobacco Never Used  . Review of Systems  Constitutional: Negative.   HENT: Negative.   Eyes: Negative.  Negative for blurred vision.  Respiratory: Negative.  Negative for shortness of breath.   Cardiovascular: Negative.  Negative for chest pain and palpitations.  Endocrine: Negative.   Genitourinary: Negative.   Musculoskeletal: Negative.   Skin: Negative.   Allergic/Immunologic: Negative.   Neurological: Negative.   Hematological: Negative.   Psychiatric/Behavioral: Negative.      Today's Vitals   12/08/18 1537  BP: 110/70  Pulse: 85  Temp: 98.1 F (36.7 C)  TempSrc: Oral  Weight: 243 lb 3.2 oz (110.3 kg)  Height: '5\' 11"'  (1.803 m)   Body mass index is 33.92 kg/m.   Objective:  Physical Exam Vitals signs and nursing note reviewed.  Constitutional:      Appearance: Normal appearance. He is obese.  HENT:     Head: Normocephalic and atraumatic.     Right Ear: Tympanic membrane, ear canal and external ear normal.     Left Ear: Tympanic membrane, ear canal and external ear normal.     Nose: Nose normal.     Mouth/Throat:     Mouth: Mucous membranes are moist.     Pharynx: Oropharynx is clear.  Eyes:     Extraocular Movements: Extraocular movements intact.     Conjunctiva/sclera: Conjunctivae normal.     Pupils: Pupils are equal, round, and reactive to light.  Neck:     Musculoskeletal: Normal range of motion and neck supple.  Cardiovascular:     Rate and Rhythm: Normal rate and regular rhythm.      Pulses: Normal pulses.          Dorsalis pedis pulses are 2+ on the right side and 2+ on the left side.     Heart sounds: Normal heart sounds.  Pulmonary:     Effort: Pulmonary effort is normal.     Breath sounds: Normal breath sounds.  Chest:     Breasts:        Right: Normal. No swelling, bleeding, inverted nipple, mass or nipple discharge.        Left: Normal. No swelling, bleeding, inverted nipple, mass or nipple discharge.  Abdominal:     General: Bowel sounds are normal.     Palpations: Abdomen is soft.     Comments: Obese, difficult to assess organomegaly  Genitourinary:  Comments: deferred Musculoskeletal: Normal range of motion.     Right foot: Normal range of motion. No deformity, bunion or Charcot foot.     Left foot: Normal range of motion. No deformity, bunion or Charcot foot.  Feet:     Right foot:     Protective Sensation: 5 sites tested. 5 sites sensed.     Toenail Condition: Right toenails are normal.     Left foot:     Protective Sensation: 5 sites tested. 5 sites sensed.     Toenail Condition: Left toenails are normal.  Skin:    General: Skin is warm.     Capillary Refill: Capillary refill takes less than 2 seconds.  Neurological:     General: No focal deficit present.     Mental Status: He is alert.  Psychiatric:        Mood and Affect: Mood normal.        Behavior: Behavior normal.         Assessment And Plan:     1. Routine general medical examination at health care facility  A full exam was performed. DRE deferred as per patient request. He is followed by Urology for his prostate exams. PATIENT HAS BEEN ADVISED TO GET 30-45 MINUTES REGULAR EXERCISE NO LESS THAN FOUR TO FIVE DAYS PER WEEK - BOTH WEIGHTBEARING EXERCISES AND AEROBIC ARE RECOMMENDED.  HE WAS ADVISED TO FOLLOW A HEALTHY DIET WITH AT LEAST SIX FRUITS/VEGGIES PER DAY, DECREASE INTAKE OF RED MEAT, AND TO INCREASE FISH INTAKE TO TWO DAYS PER WEEK.  MEATS/FISH SHOULD NOT BE FRIED, BAKED OR  BROILED IS PREFERABLE.  I SUGGEST WEARING SPF 50 SUNSCREEN ON EXPOSED PARTS AND ESPECIALLY WHEN IN THE DIRECT SUNLIGHT FOR AN EXTENDED PERIOD OF TIME.  PLEASE AVOID FAST FOOD RESTAURANTS AND INCREASE YOUR WATER INTAKE.  - Hemoglobin A1c - CMP14+EGFR - EKG 12-Lead - CBC no Diff  2. Diabetes mellitus with stage 2 chronic kidney disease ([place])  Diabetic foot exam was performed.  I DISCUSSED WITH THE PATIENT AT LENGTH REGARDING THE GOALS OF GLYCEMIC CONTROL AND POSSIBLE LONG-TERM COMPLICATIONS.  I  ALSO STRESSED THE IMPORTANCE OF COMPLIANCE WITH HOME GLUCOSE MONITORING, DIETARY RESTRICTIONS INCLUDING AVOIDANCE OF SUGARY DRINKS/PROCESSED FOODS,  ALONG WITH REGULAR EXERCISE.  I  ALSO STRESSED THE IMPORTANCE OF ANNUAL EYE EXAMS, SELF FOOT CARE AND COMPLIANCE WITH OFFICE VISITS.  - POCT Urinalysis Dipstick (81002) - POCT UA - Microalbumin  3. Hypertensive heart and renal disease with renal failure, stage 1 through stage 4 or unspecified chronic kidney disease, without heart failure  Well controlled. He will continue with current meds. EKG performed, afib noted.  He is encouraged to avoid adding salt to his foods. He is also encouraged to incorporate more exercise into his daily routine.   4. Typical atrial flutter ([facility])  Chronic, rate controlled. He is encouraged to continue with current meds.   5. Class 1 obesity due to excess calories with serious comorbidity and body mass index (BMI) of 33.0 to 33.9 in adult  Importance of achieving optimal weight to decrease risk of cardiovascular disease and cancers was discussed with the patient in full detail. Importance of regular exercise was stressed to the patient.  He is encouraged to start slowly - start with 10 minutes twice daily at least three to four days per week and to gradually build to 30 minutes five days weekly. He was given tips to incorporate more activity into her daily routine - take stairs when possible, park farther  away from her job,  grocery stores, etc.    6. Screen for colon cancer  I will refer him to GI for CRC screening.   - Ambulatory referral to Gastroenterology     Maximino Greenland, MD    THE PATIENT IS ENCOURAGED TO PRACTICE SOCIAL DISTANCING DUE TO THE COVID-19 PANDEMIC.

## 2018-12-10 NOTE — Telephone Encounter (Signed)
-----   Message from Glendale Chard, MD sent at 12/09/2018 10:55 AM EDT ----- Your hba1c has not improved by  Much. Your hba1c is 9.2, down from 9.4. What lifestyle changes have you made to improve your sugars since your last visit? Your kidney function is stable. Your liver enzymes are elevated, which imply that you do not get enough exercise and that you may be getting too much sugar/processed foods in your diet. We need to increase your Farxiga to improve your sugars. Are you willing to do this? OR we can increase your Toujeo. Let me know how you wish to proceed. In order to increase Toujeo, I will need to know your morning sugars.

## 2018-12-15 ENCOUNTER — Telehealth: Payer: Self-pay

## 2018-12-15 NOTE — Telephone Encounter (Signed)
PA FOR OZEMPIC SENT TO PLAN

## 2018-12-16 ENCOUNTER — Encounter: Payer: Self-pay | Admitting: Internal Medicine

## 2018-12-16 ENCOUNTER — Telehealth: Payer: Self-pay

## 2018-12-16 NOTE — Telephone Encounter (Signed)
Pa approved for ozempic cost to pt is 24.99 for 30 day. Pharm and pt notified

## 2018-12-24 ENCOUNTER — Telehealth: Payer: Self-pay | Admitting: *Deleted

## 2018-12-24 NOTE — Telephone Encounter (Signed)
   [facility] Pre-operative Risk Assessment    Request for surgical clearance:  1. What type of surgery is being performed? COLONOSCOPY   2. When is this surgery scheduled? 01/26/19   3. What type of clearance is required (medical clearance vs. Pharmacy clearance to hold med vs. Both)? BOTH  4. Are there any medications that need to be held prior to surgery and how long? ELIQUIS   5. Practice name and name of physician performing surgery? [facility] DR. MANN   6. What is your office phone number [phone]    7.   What is your office fax number [phone]  8.   Anesthesia type (None, local, MAC, general) ? PROPOFOL   Julaine Hua 12/24/2018, 9:39 AM  _________________________________________________________________   (provider comments below)

## 2018-12-26 NOTE — Telephone Encounter (Signed)
Patient with diagnosis of atrial fibrillation on Eliquis for anticoagulation.    Procedure: colonoscopy Date of procedure: 01/26/2019  CHADS2-VASc score of  4 (HTN, DM2, stroke/tia x 2)  CrCl 116 Platelet count 197  Per office protocol, patient can hold Eliquis for 1 days prior to procedure.    Patient should restart Eliquis on the evening of procedure or day after, at discretion of procedure MD

## 2018-12-26 NOTE — Telephone Encounter (Signed)
   Primary Cardiologist: Fransico Him, MD  Chart reviewed as part of pre-operative protocol coverage. Patient was contacted 12/26/2018 in reference to pre-operative risk assessment for pending surgery as outlined below.  Colin [name] was last seen on 04/15/2018 by Roderic Palau, NP.  Since that day, Colin [name] has done well from a cardiac standpoint. He works a fairly strenuous job in a [employer]. No complaints of chest pain or SOB with activity. He remains in atrial fibrillation but denies any complaints of palpitations.   Therefore, based on ACC/AHA guidelines, the patient would be at acceptable risk for the planned procedure without further cardiovascular testing.   Per pharmacy recommendations, patient can hold eliquis 1 day prior to his colonoscopy. Eliquis should be restarted as soon as he is cleared to do so by his gastroenterologist.   I will route this recommendation to the requesting party via Central fax function and remove from pre-op pool.  Please call with questions.  Abigail Butts, PA-C 12/26/2018, 3:31 PM

## 2019-01-26 LAB — HM COLONOSCOPY

## 2019-01-27 ENCOUNTER — Encounter: Payer: Self-pay | Admitting: Internal Medicine

## 2019-01-28 ENCOUNTER — Other Ambulatory Visit: Payer: Self-pay

## 2019-01-28 ENCOUNTER — Emergency Department (INDEPENDENT_AMBULATORY_CARE_PROVIDER_SITE_OTHER)
Admission: EM | Admit: 2019-01-28 | Discharge: 2019-01-28 | Disposition: A | Discharge status: Home / Self Care | Payer: No Typology Code available for payment source | Source: Home / Self Care

## 2019-01-28 ENCOUNTER — Encounter: Payer: Self-pay | Admitting: *Deleted

## 2019-01-28 DIAGNOSIS — M545 Low back pain, unspecified: Secondary | ICD-10-CM

## 2019-01-28 DIAGNOSIS — Y990 Civilian activity done for income or pay: Secondary | ICD-10-CM

## 2019-01-28 MED ORDER — CYCLOBENZAPRINE HCL 5 MG PO TABS: 5.0000 mg | ORAL_TABLET | Freq: Every day | ORAL | 0 refills | Status: DC
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
## 2019-01-28 NOTE — Discharge Instructions (Signed)
°  Flexeril (cyclobenzaprine) is a muscle relaxer and may cause drowsiness. Do not drink alcohol, drive, or operate heavy machinery while taking.  You may also take 500mg  acetaminophen every 4-6 hours to help with pain.  You may also alternate cool and warm compresses 2-3 times daily for 15-20 minutes at a time. Start with cool compresses tonight.  Please call to schedule a follow up appointment with Employee Health at [facility] for further evaluation and treatment if not improving after tomorrow.

## 2019-02-04 ENCOUNTER — Other Ambulatory Visit: Payer: Self-pay

## 2019-02-04 MED ORDER — COLCHICINE 0.6 MG PO TABS: 0.6000 mg | ORAL_TABLET | Freq: Every day | ORAL | 2 refills | Status: DC | PRN

## 2019-03-16 ENCOUNTER — Ambulatory Visit: Payer: PRIVATE HEALTH INSURANCE | Admitting: Internal Medicine

## 2019-04-07 ENCOUNTER — Other Ambulatory Visit: Payer: Self-pay

## 2019-04-07 ENCOUNTER — Encounter: Payer: Self-pay | Admitting: Internal Medicine

## 2019-04-07 ENCOUNTER — Ambulatory Visit (INDEPENDENT_AMBULATORY_CARE_PROVIDER_SITE_OTHER): Payer: PRIVATE HEALTH INSURANCE | Admitting: Internal Medicine

## 2019-04-07 VITALS — BP 118/76 | HR 91 | Temp 98.1°F | Wt 253.2 lb

## 2019-04-07 DIAGNOSIS — I483 Typical atrial flutter: Secondary | ICD-10-CM

## 2019-04-07 DIAGNOSIS — E1122 Type 2 diabetes mellitus with diabetic chronic kidney disease: Secondary | ICD-10-CM

## 2019-04-07 DIAGNOSIS — E66812 Obesity, class 2: Secondary | ICD-10-CM

## 2019-04-07 DIAGNOSIS — Z6835 Body mass index (BMI) 35.0-35.9, adult: Secondary | ICD-10-CM

## 2019-04-07 DIAGNOSIS — E039 Hypothyroidism, unspecified: Secondary | ICD-10-CM

## 2019-04-07 DIAGNOSIS — I1310 Hypertensive heart and chronic kidney disease without heart failure, with stage 1 through stage 4 chronic kidney disease, or unspecified chronic kidney disease: Secondary | ICD-10-CM

## 2019-04-07 DIAGNOSIS — N182 Chronic kidney disease, stage 2 (mild): Secondary | ICD-10-CM | POA: Diagnosis not present

## 2019-04-07 MED ORDER — SILDENAFIL CITRATE 100 MG PO TABS: 100.0000 mg | ORAL_TABLET | Freq: Every day | ORAL | 1 refills | Status: DC | PRN

## 2019-04-07 NOTE — Progress Notes (Signed)
This visit occurred during the SARS-CoV-2 public health emergency.  Safety protocols were in place, including screening questions prior to the visit, additional usage of staff PPE, and extensive cleaning of exam room while observing appropriate contact time as indicated for disinfecting solutions.  Subjective:     Patient ID: Colin [name] , male    DOB: [birth year] , 63 y.o.   MRN: [id number]   Chief Complaint  Patient presents with  . Diabetes  . Hypertension    HPI  Diabetes He presents for his follow-up diabetic visit. He has type 2 diabetes mellitus. His disease course has been improving. There are no hypoglycemic associated symptoms. Pertinent negatives for diabetes include no blurred vision and no chest pain. There are no hypoglycemic complications. Diabetic complications include nephropathy. Risk factors for coronary artery disease include diabetes mellitus, dyslipidemia, hypertension, sedentary lifestyle, male sex and obesity. He participates in exercise intermittently. His breakfast blood glucose is taken between 8-9 am. His breakfast blood glucose range is generally 140-180 mg/dl. An ACE inhibitor/angiotensin II receptor blocker is being taken. Eye exam is current.  Hypertension This is a chronic problem. The current episode started more than 1 year ago. The problem has been gradually improving since onset. The problem is controlled. Pertinent negatives include no blurred vision, chest pain, palpitations or shortness of breath.     Past Medical History:  Diagnosis Date  . Atrial flutter ([place])   . DCM (dilated cardiomyopathy) ([place])    EF 35-40% years ago but echo a year ago showed normal LVF  . Dilated aortic root ([place]) 04/15/2015   18m by echo 06/2017 and Chest CT 12/2016  . Hypertension   . Obesities, morbid ([place]   . OSA (obstructive sleep apnea)   . Persistent atrial fibrillation (HCC)    s/p TEE/DCCV now on Sotolol     Family History  Problem Relation Age of Onset  .  Stroke Mother   . Heart Problems Father        weak heart  . Heart Problems Paternal Grandfather        pacemaker     Current Outpatient Medications:  .  allopurinol (ZYLOPRIM) 100 MG tablet, Take 1 tablet (100 mg total) by mouth at bedtime., Disp: 30 tablet, Rfl: 1 .  apixaban (ELIQUIS) 5 MG TABS tablet, TAKE 1 TABLET(5 MG) BY MOUTH TWICE DAILY, Disp: 60 tablet, Rfl: 6 .  atorvastatin (LIPITOR) 80 MG tablet, TAKE 1 TABLET BY MOUTH EVERY DAY, Disp: 90 tablet, Rfl: 1 .  colchicine 0.6 MG tablet, Take 1 tablet (0.6 mg total) by mouth daily as needed. colcrys, Disp: 30 tablet, Rfl: 2 .  dapagliflozin propanediol (FARXIGA) 5 MG TABS tablet, Take 1 tablet by mouth daily, Disp: 30 tablet, Rfl: 3 .  levothyroxine (SYNTHROID, LEVOTHROID) 200 MCG tablet, Take 1 tablet by mouth Monday - Saturday and 1/2 tablet on Sunday, Disp: 90 tablet, Rfl: 1 .  metoprolol tartrate (LOPRESSOR) 50 MG tablet, Take 1 tablet (50 mg total) by mouth 2 (two) times daily., Disp: 180 tablet, Rfl: 2 .  Multiple Vitamins-Minerals (CENTRUM SILVER ADULT 50+ PO), Take 1 tablet by mouth daily. , Disp: , Rfl:  .  NOVOTWIST 32G X 5 MM MISC, See admin instructions., Disp: , Rfl: 3 .  OZEMPIC, 0.25 OR 0.5 MG/DOSE, 2 MG/1.5ML SOPN, INJECT 0.5 MG UNDER THE SKIN EVERY WEEK ON THE SAME DAY OF EACH WEEK, IN THE ABDOMINAL, THIGH, OR UPPER ARM ROTATING INJECTION SITES, Disp: 1.5 mL, Rfl: 5 .  TOUJEO SOLOSTAR 300 UNIT/ML SOPN, INJECT 40 UNITS UNDER THE SKIN AS PER INSULIN PROTOCOL, Disp: 4.5 mL, Rfl: 4 .  sildenafil (VIAGRA) 100 MG tablet, Take 1 tablet (100 mg total) by mouth daily as needed for erectile dysfunction., Disp: 10 tablet, Rfl: 1   No Known Allergies   Review of Systems  Constitutional: Negative.   Eyes: Negative for blurred vision.  Respiratory: Negative.  Negative for shortness of breath.   Cardiovascular: Negative.  Negative for chest pain and palpitations.  Gastrointestinal: Negative.   Neurological: Negative.    Psychiatric/Behavioral: Negative.      Today's Vitals   04/07/19 1555  BP: 118/76  Pulse: 91  Temp: 98.1 F (36.7 C)  TempSrc: Oral  SpO2: 99%  Weight: 253 lb 3.2 oz (114.9 kg)   Body mass index is 35.31 kg/m.   Objective:  Physical Exam Vitals signs and nursing note reviewed.  Constitutional:      Appearance: Normal appearance.  Cardiovascular:     Rate and Rhythm: Normal rate. Rhythm irregular.     Heart sounds: Normal heart sounds.  Pulmonary:     Effort: Pulmonary effort is normal.     Breath sounds: Normal breath sounds.  Skin:    General: Skin is warm.  Neurological:     General: No focal deficit present.     Mental Status: He is alert.  Psychiatric:        Mood and Affect: Mood normal.         Assessment And Plan:     1. Diabetes mellitus with stage 2 chronic kidney disease ([place])  I will check labs as listed below. Unfortunately, he has not been taking Farxiga, 82m as prescribed. Rx was sent earlier this year, but he states pharmacy never told him medication was available.  He did not tolerate Jardiance due to balanitis. He was given samples. I will check labs as listed below. He admits to drinking 2L of diet soda every 3 days. Pt advised to cut back to no more than 8 ounces per day. Risks associated with long-term use of artificial sweeteners was discussed with the patient.   - Hemoglobin A1c - BMP8+EGFR  2. Hypertensive heart and renal disease with renal failure, stage 1 through stage 4 or unspecified chronic kidney disease, without heart failure  Chronic, well controlled. He will continue with current meds. He is encouraged to limit his salt intake.   3. Typical atrial flutter (HCC)  Chronic, rate controlled.   4. Primary hypothyroidism  I will check thyroid panel and adjust meds as needed.  - TSH - T4, Free  5. Class 2 severe obesity due to excess calories with serious comorbidity and body mass index (BMI) of 35.0 to 35.9 in adult  ([facility]  Importance of achieving optimal weight to decrease risk of cardiovascular disease and cancers was discussed with the patient in full detail. He is encouraged to start slowly - start with 10 minutes twice daily at least three to four days per week and to gradually build to 30 minutes five days weekly. He was given tips to incorporate more activity into his daily routine - take stairs when possible, park farther away from his job, grocery stores, etc.    RMaximino Greenland MD    THE PATIENT IS ENCOURAGED TO PRACTICE SOCIAL DISTANCING DUE TO THE COVID-19 PANDEMIC.

## 2019-04-07 NOTE — Patient Instructions (Signed)
Diabetes Mellitus and Exercise Exercising regularly is important for your overall health, especially when you have diabetes (diabetes mellitus). Exercising is not only about losing weight. It has many other health benefits, such as increasing muscle strength and bone density and reducing body fat and stress. This leads to improved fitness, flexibility, and endurance, all of which result in better overall health. Exercise has additional benefits for people with diabetes, including:  Reducing appetite.  Helping to lower and control blood glucose.  Lowering blood pressure.  Helping to control amounts of fatty substances (lipids) in the blood, such as cholesterol and triglycerides.  Helping the body to respond better to insulin (improving insulin sensitivity).  Reducing how much insulin the body needs.  Decreasing the risk for heart disease by: ? Lowering cholesterol and triglyceride levels. ? Increasing the levels of good cholesterol. ? Lowering blood glucose levels. What is my activity plan? Your health care provider or certified diabetes educator can help you make a plan for the type and frequency of exercise (activity plan) that works for you. Make sure that you:  Do at least 150 minutes of moderate-intensity or vigorous-intensity exercise each week. This could be brisk walking, biking, or water aerobics. ? Do stretching and strength exercises, such as yoga or weightlifting, at least 2 times a week. ? Spread out your activity over at least 3 days of the week.  Get some form of physical activity every day. ? Do not go more than 2 days in a row without some kind of physical activity. ? Avoid being inactive for more than 30 minutes at a time. Take frequent breaks to walk or stretch.  Choose a type of exercise or activity that you enjoy, and set realistic goals.  Start slowly, and gradually increase the intensity of your exercise over time. What do I need to know about managing my  diabetes?   Check your blood glucose before and after exercising. ? If your blood glucose is 240 mg/dL (13.3 mmol/L) or higher before you exercise, check your urine for ketones. If you have ketones in your urine, do not exercise until your blood glucose returns to normal. ? If your blood glucose is 100 mg/dL (5.6 mmol/L) or lower, eat a snack containing 15-20 grams of carbohydrate. Check your blood glucose 15 minutes after the snack to make sure that your level is above 100 mg/dL (5.6 mmol/L) before you start your exercise.  Know the symptoms of low blood glucose (hypoglycemia) and how to treat it. Your risk for hypoglycemia increases during and after exercise. Common symptoms of hypoglycemia can include: ? Hunger. ? Anxiety. ? Sweating and feeling clammy. ? Confusion. ? Dizziness or feeling light-headed. ? Increased heart rate or palpitations. ? Blurry vision. ? Tingling or numbness around the mouth, lips, or tongue. ? Tremors or shakes. ? Irritability.  Keep a rapid-acting carbohydrate snack available before, during, and after exercise to help prevent or treat hypoglycemia.  Avoid injecting insulin into areas of the body that are going to be exercised. For example, avoid injecting insulin into: ? The arms, when playing tennis. ? The legs, when jogging.  Keep records of your exercise habits. Doing this can help you and your health care provider adjust your diabetes management plan as needed. Write down: ? Food that you eat before and after you exercise. ? Blood glucose levels before and after you exercise. ? The type and amount of exercise you have done. ? When your insulin is expected to peak, if you use   insulin. Avoid exercising at times when your insulin is peaking.  When you start a new exercise or activity, work with your health care provider to make sure the activity is safe for you, and to adjust your insulin, medicines, or food intake as needed.  Drink plenty of water while  you exercise to prevent dehydration or heat stroke. Drink enough fluid to keep your urine clear or pale yellow. Summary  Exercising regularly is important for your overall health, especially when you have diabetes (diabetes mellitus).  Exercising has many health benefits, such as increasing muscle strength and bone density and reducing body fat and stress.  Your health care provider or certified diabetes educator can help you make a plan for the type and frequency of exercise (activity plan) that works for you.  When you start a new exercise or activity, work with your health care provider to make sure the activity is safe for you, and to adjust your insulin, medicines, or food intake as needed. This information is not intended to replace advice given to you by your health care provider. Make sure you discuss any questions you have with your health care provider. Document Released: 07/14/2003 Document Revised: 11/15/2016 Document Reviewed: 10/03/2015 Elsevier Patient Education  2020 Elsevier Inc.  

## 2019-04-08 LAB — BMP8+EGFR
BUN/Creatinine Ratio: 13 (ref 10–24)
BUN: 14 mg/dL (ref 8–27)
CO2: 24 mmol/L (ref 20–29)
Calcium: 9.6 mg/dL (ref 8.6–10.2)
Chloride: 100 mmol/L (ref 96–106)
Creatinine, Ser: 1.11 mg/dL (ref 0.76–1.27)
GFR calc Af Amer: 82 mL/min/{1.73_m2} (ref >59)
GFR calc non Af Amer: 71 mL/min/{1.73_m2} (ref >59)
Glucose: 219 mg/dL — ABNORMAL HIGH (ref 65–99)
Potassium: 4.7 mmol/L (ref 3.5–5.2)
Sodium: 136 mmol/L (ref 134–144)

## 2019-04-08 LAB — T4, FREE: Free T4: 1.4 ng/dL (ref 0.82–1.77)

## 2019-04-08 LAB — TSH: TSH: 3.19 u[IU]/mL (ref 0.450–4.500)

## 2019-04-08 LAB — HEMOGLOBIN A1C
Est. average glucose Bld gHb Est-mCnc: 183 mg/dL
Hgb A1c MFr Bld: 8 % — ABNORMAL HIGH (ref 4.8–5.6)

## 2019-04-29 ENCOUNTER — Other Ambulatory Visit: Payer: Self-pay

## 2019-04-29 ENCOUNTER — Telehealth: Payer: Self-pay

## 2019-04-29 MED ORDER — FARXIGA 5 MG PO TABS: ORAL_TABLET | ORAL | 3 refills | Status: DC

## 2019-04-29 NOTE — Telephone Encounter (Signed)
Pt called wanting to confirm farxiga. Provider stated that he should be taking and for me to send rx to his pharmacy

## 2019-05-05 LAB — HM DIABETES EYE EXAM

## 2019-05-07 ENCOUNTER — Telehealth: Payer: Self-pay | Admitting: *Deleted

## 2019-05-07 NOTE — Telephone Encounter (Signed)

## 2019-05-11 ENCOUNTER — Telehealth: Payer: Self-pay

## 2019-05-11 NOTE — Telephone Encounter (Signed)
Pa approved for farxiga cost to pt is $15 for a months supply

## 2019-05-11 NOTE — Telephone Encounter (Signed)
Pa sent to plan for farxiga. Waiting for determination

## 2019-05-12 ENCOUNTER — Encounter: Payer: Self-pay | Admitting: Internal Medicine

## 2019-05-15 ENCOUNTER — Encounter: Payer: Self-pay | Admitting: Cardiology

## 2019-05-15 ENCOUNTER — Telehealth: Payer: Self-pay | Admitting: *Deleted

## 2019-05-15 ENCOUNTER — Other Ambulatory Visit: Payer: Self-pay

## 2019-05-15 ENCOUNTER — Telehealth (INDEPENDENT_AMBULATORY_CARE_PROVIDER_SITE_OTHER): Payer: Commercial Managed Care - PPO | Admitting: Cardiology

## 2019-05-15 VITALS — Ht 71.0 in | Wt 252.0 lb

## 2019-05-15 DIAGNOSIS — I10 Essential (primary) hypertension: Secondary | ICD-10-CM | POA: Diagnosis not present

## 2019-05-15 DIAGNOSIS — I77810 Thoracic aortic ectasia: Secondary | ICD-10-CM

## 2019-05-15 DIAGNOSIS — G4733 Obstructive sleep apnea (adult) (pediatric): Secondary | ICD-10-CM

## 2019-05-15 DIAGNOSIS — Z9989 Dependence on other enabling machines and devices: Secondary | ICD-10-CM

## 2019-05-15 DIAGNOSIS — I4819 Other persistent atrial fibrillation: Secondary | ICD-10-CM | POA: Diagnosis not present

## 2019-05-15 NOTE — Telephone Encounter (Signed)
Download sent to TT for advisement.

## 2019-05-15 NOTE — Patient Instructions (Signed)
Medication Instructions:  °Your physician recommends that you continue on your current medications as directed. Please refer to the Current Medication list given to you today. ° °*If you need a refill on your cardiac medications before your next appointment, please call your pharmacy* ° °Testing/Procedures: °Your physician has requested that you have an echocardiogram. Echocardiography is a painless test that uses sound waves to create images of your heart. It provides your doctor with information about the size and shape of your heart and how well your heart’s chambers and valves are working. This procedure takes approximately one hour. There are no restrictions for this procedure. ° °Follow-Up: °At [facility], you and your health needs are our priority.  As part of our continuing mission to provide you with exceptional heart care, we have created designated Provider Care Teams.  These Care Teams include your primary Cardiologist (physician) and Advanced Practice Providers (APPs -  Physician Assistants and Nurse Practitioners) who all work together to provide you with the care you need, when you need it. ° °Your next appointment:   °1 year(s) ° °The format for your next appointment:   °In Person ° °Provider:   °Traci Turner, MD   ° ° ° °

## 2019-05-15 NOTE — Progress Notes (Signed)
Virtual Visit via Telephone Note   This visit type was conducted due to national recommendations for restrictions regarding the COVID-19 Pandemic (e.g. social distancing) in an effort to limit this patient's exposure and mitigate transmission in our community.  Due to his co-morbid illnesses, this patient is at least at moderate risk for complications without adequate follow up.  This format is felt to be most appropriate for this patient at this time.  The patient did not have access to video technology/had technical difficulties with video requiring transitioning to audio format only (telephone).  All issues noted in this document were discussed and addressed.  No physical exam could be performed with this format.  Please refer to the patient's chart for his  consent to telehealth for [facility].   Evaluation Performed:  Follow-up visit  This visit type was conducted due to national recommendations for restrictions regarding the COVID-19 Pandemic (e.g. social distancing).  This format is felt to be most appropriate for this patient at this time.  All issues noted in this document were discussed and addressed.  No physical exam was performed (except for noted visual exam findings with Video Visits).  Please refer to the patient's chart (MyChart message for video visits and phone note for telephone visits) for the patient's consent to telehealth for [facility].  Date:  05/15/2019   ID:  Colin [name], DOB 1955/12/30, MRN [id number]  Patient Location:  Home  Provider location:   [place]  PCP:  Dorothyann Peng, MD  Cardiologist:  Armanda Magic, MD  Electrophysiologist:  None   Chief Complaint:  OSA  History of Present Illness:    Colin [name] is a 64 y.o. male who presents via audio/video conferencing for a telehealth visit today.    Colin [name] is a 63 y.o. male with a hx of moderate OSA with an AHI of 15/hr with oxygen saturations <85%. Heis onCPAP at9cm H2O.  He also  has a history of paroxysmal atrial flutter and atrial fibrillation and is followed in afib clinic s/p ablation, dilated aortic root and HTN.  He was last seen in afib clinic in 2019 and was back in afib.  He failed Sotolol and this was stopped and decision made to pursue rate control.    HE is here today for followup and is doing well.  He denies any chest pain or pressure, SOB, DOE, PND, orthopnea, LE edema, dizziness, palpitations or syncope. He is compliant with his meds and is tolerating meds with no SE.  He is doing well with his CPAP device but had run out of supplies so he had not been using his device because of this.  He tolerates the mask and feels the pressure is adequate.  When he uses the CPAP He feels rested in the am and has no significant daytime sleepiness.  He denies any significant mouth or nasal dryness or nasal congestion.  He does not think that he snores.    The patient does not have symptoms concerning for COVID-19 infection (fever, chills, cough, or new shortness of breath).    Prior CV studies:   The following studies were reviewed today:  PAP compliance download  Past Medical History:  Diagnosis Date  . Atrial flutter (HCC)   . DCM (dilated cardiomyopathy) (HCC)    EF 35-40% years ago but echo a year ago showed normal LVF  . Dilated aortic root (HCC) 04/15/2015   7mm by echo 06/2017 and Chest CT 12/2016  . Hypertension   .  Obesities, morbid ([place])   . OSA (obstructive sleep apnea)   . Persistent atrial fibrillation [facility])    s/p TEE/DCCV now on Sotolol   Past Surgical History:  Procedure Laterality Date  . CARDIOVERSION N/A 09/24/2014   Procedure: CARDIOVERSION;  Surgeon: Sueanne Margarita, MD;  Location: [facility] ENDOSCOPY;  Service: Cardiovascular;  Laterality: N/A;  . CARDIOVERSION N/A 08/02/2017   Procedure: CARDIOVERSION;  Surgeon: Fay Records, MD;  Location: [place];  Service: Cardiovascular;  Laterality: N/A;  . TEE WITHOUT CARDIOVERSION N/A 09/24/2014    Procedure: TRANSESOPHAGEAL ECHOCARDIOGRAM (TEE);  Surgeon: Sueanne Margarita, MD;  Location: [facility] ENDOSCOPY;  Service: Cardiovascular;  Laterality: N/A;     No outpatient medications have been marked as taking for the 05/15/19 encounter (Telemedicine) with Sueanne Margarita, MD.     Allergies:   Patient has no known allergies.   Social History   Tobacco Use  . Smoking status: Former Smoker    Packs/day: 0.50    Years: 15.00    Pack years: 7.50    Types: Cigarettes  . Smokeless tobacco: Never Used  Substance Use Topics  . Alcohol use: No    Alcohol/week: 0.0 standard drinks  . Drug use: No     Family Hx: The patient's family history includes Heart Problems in his father and paternal grandfather; Stroke in his mother.  ROS:   Please see the history of present illness.     All other systems reviewed and are negative.   Labs/Other Tests and Data Reviewed:    Recent Labs: 12/08/2018: ALT 61; Hemoglobin 14.9; Platelets 197 04/07/2019: BUN 14; Creatinine, Ser 1.11; Potassium 4.7; Sodium 136; TSH 3.190   Recent Lipid Panel Lab Results  Component Value Date/Time   CHOL 206 (H) 07/14/2018 04:52 PM   TRIG 104 07/14/2018 04:52 PM   HDL 44 07/14/2018 04:52 PM   CHOLHDL 4.7 07/14/2018 04:52 PM   LDLCALC 141 (H) 07/14/2018 04:52 PM    Wt Readings from Last 3 Encounters:  05/15/19 252 lb (114.3 kg)  04/07/19 253 lb 3.2 oz (114.9 kg)  01/28/19 250 lb (113.4 kg)     Objective:    Vital Signs:  Ht 5\' 11"  (1.803 m)   Wt 252 lb (114.3 kg)   BMI 35.15 kg/m     ASSESSMENT & PLAN:    1.  OSA -  The patient is tolerating PAP therapy well without any problems. The PAP download was reviewed today and showed an AHI of 0.9/hr on 9 cm H2O with 17% compliance in using more than 4 hours nightly.  The patient has been using and benefiting from PAP use and will continue to benefit from therapy. I encouraged him to be more compliant with his device. I will reorder supplies for him as he says that  the reason that he has not been compliant is he needed new supplies and the DME would not give them to him   2.  HTN  -BP controlled on exam -continue Lopressor 50mg  BID  3.  Permanent atrial fibrillation -He failed AADT and is now rate controlled on BB -continue Lopressor 50mg  BID  -continue Eliquis 5mg  BID -denies any bleeding problems on DOAC -creatinine was 1.11 in Dec 2020 and Hbg 14.9 in 12/2018  4.  Morbid Obesity -I have encouraged him to get into a routine exercise program and cut back on carbs and portions.   5.  Dilated aortic root and ascending aorta -2D echo 06/2017 showed ascending aorta at 67mm  and aortic root 67mm which is stable from 12/2016. -CT scan 07/2018 with 4cm Ascending aortic anuerysm -will repeat 2D echo to assess for stability  COVID-19 Education: The signs and symptoms of COVID-19 were discussed with the patient and how to seek care for testing (follow up with PCP or arrange E-visit).  The importance of social distancing was discussed today.  Patient Risk:   After full review of this patient's clinical status, I feel that they are at least moderate risk at this time.  Time:   Today, I have spent 20 minutes directly with the patient on telemedicine discussing medical problems including PAF, HTN, OSA, obesity, aortic dilatation.  We also reviewed the symptoms of COVID 19 and the ways to protect against contracting the virus with telehealth technology.  I spent an additional 5 minutes reviewing patient's chart including Chest CT, 2D echo.  Medication Adjustments/Labs and Tests Ordered: Current medicines are reviewed at length with the patient today.  Concerns regarding medicines are outlined above.  Tests Ordered: No orders of the defined types were placed in this encounter.  Medication Changes: No orders of the defined types were placed in this encounter.   Disposition:  Follow up in 1 year(s)  Signed, Armanda Magic, MD  05/15/2019 11:17 AM    [facility]

## 2019-05-15 NOTE — Telephone Encounter (Signed)
Order placed to [email or web address] community message 

## 2019-05-15 NOTE — Telephone Encounter (Signed)
-----   Message from Quintella Reichert, MD sent at 05/15/2019 11:40 AM EST ----- Please order order new supplies, hose, mask and water reservoir ASAP as he ran out of supplies and [employer] would not reorder them after the merger.

## 2019-05-28 ENCOUNTER — Other Ambulatory Visit: Payer: Self-pay

## 2019-05-28 ENCOUNTER — Ambulatory Visit: Payer: Commercial Managed Care - PPO

## 2019-05-28 ENCOUNTER — Telehealth: Payer: Self-pay

## 2019-05-28 ENCOUNTER — Ambulatory Visit (HOSPITAL_COMMUNITY): Payer: Commercial Managed Care - PPO | Attending: Internal Medicine

## 2019-05-28 DIAGNOSIS — G4733 Obstructive sleep apnea (adult) (pediatric): Secondary | ICD-10-CM | POA: Diagnosis present

## 2019-05-28 DIAGNOSIS — I712 Thoracic aortic aneurysm, without rupture, unspecified: Secondary | ICD-10-CM

## 2019-05-28 DIAGNOSIS — Z9989 Dependence on other enabling machines and devices: Secondary | ICD-10-CM | POA: Diagnosis present

## 2019-05-28 DIAGNOSIS — I77810 Thoracic aortic ectasia: Secondary | ICD-10-CM

## 2019-05-28 DIAGNOSIS — I4819 Other persistent atrial fibrillation: Secondary | ICD-10-CM | POA: Diagnosis present

## 2019-05-28 DIAGNOSIS — Z20822 Contact with and (suspected) exposure to covid-19: Secondary | ICD-10-CM

## 2019-05-28 DIAGNOSIS — I10 Essential (primary) hypertension: Secondary | ICD-10-CM

## 2019-05-28 NOTE — Telephone Encounter (Signed)
-----   Message from Quintella Reichert, MD sent at 05/28/2019 12:14 PM EST ----- Echo showed normal heart function with mildly thickened heart muscle. There is mild leakiness of the MV, TV and AV and mildly dilated ascending aorta at 4cm which corresponds to findings on CT in 07/2018.  Repeat echo in 1 year for dilated aorta

## 2019-05-29 ENCOUNTER — Telehealth: Payer: Self-pay | Admitting: Internal Medicine

## 2019-05-29 ENCOUNTER — Telehealth: Payer: Self-pay

## 2019-05-29 LAB — NOVEL CORONAVIRUS, NAA: SARS-CoV-2, NAA: DETECTED — AB

## 2019-05-29 NOTE — Telephone Encounter (Signed)
Patient called wanted to inform the office that he took a covid test because he is having symptoms and wanted to know if there was any medication that he may need and wanted to know what to do. I advise I would tell dr sanders and that he must stay in quarantine until his results come back and to call his employer on the work guidelines for covid

## 2019-05-29 NOTE — Telephone Encounter (Signed)
Patient advise that result not back yet 

## 2019-05-30 ENCOUNTER — Other Ambulatory Visit: Payer: Self-pay | Admitting: Adult Health

## 2019-05-30 DIAGNOSIS — U071 COVID-19: Secondary | ICD-10-CM

## 2019-05-30 NOTE — Progress Notes (Addendum)
  I connected by phone with Colin [name] on 05/30/2019 at 8:49 AM to discuss the potential use of an new treatment for mild to moderate COVID-19 viral infection in non-hospitalized patients.  This patient is a 64 y.o. male that meets the FDA criteria for Emergency Use Authorization of bamlanivimab or casirivimab\imdevimab.  Has a (+) direct SARS-CoV-2 viral test result  Has mild or moderate COVID-19   Is ? 63 years of age and weighs ? 40 kg  Is NOT hospitalized due to COVID-19  Is NOT requiring oxygen therapy or requiring an increase in baseline oxygen flow rate due to COVID-19  Is within 10 days of symptom onset  Has at least one of the high risk factor(s) for progression to severe COVID-19 and/or hospitalization as defined in EUA.  Specific high risk criteria : Hypertension   I have spoken and communicated the following to the patient or parent/caregiver:  1. FDA has authorized the emergency use of bamlanivimab and casirivimab\imdevimab for the treatment of mild to moderate COVID-19 in adults and pediatric patients with positive results of direct SARS-CoV-2 viral testing who are 40 years of age and older weighing at least 40 kg, and who are at high risk for progressing to severe COVID-19 and/or hospitalization.  2. The significant known and potential risks and benefits of bamlanivimab and casirivimab\imdevimab, and the extent to which such potential risks and benefits are unknown.  3. Information on available alternative treatments and the risks and benefits of those alternatives, including clinical trials.  4. Patients treated with bamlanivimab and casirivimab\imdevimab should continue to self-isolate and use infection control measures (e.g., wear mask, isolate, social distance, avoid sharing personal items, clean and disinfect "high touch" surfaces, and frequent handwashing) according to CDC guidelines.   5. The patient or parent/caregiver has the option to accept or refuse  bamlanivimab or casirivimab\imdevimab .  After reviewing this information with the patient, The patient agreed to proceed with receiving the bamlanimivab infusion and will be provided a copy of the Fact sheet prior to receiving the infusion.   Scheduled 06/02/19 at 1630, Directions given.   Honesti Seaberg NP  05/30/2019 8:49 AM   ADDENDUM 06/02/19 @ 3:35pm Pt called into r/s appt to Friday since he has to take care of his wife who is also sick. He has been rescheduled for Friday 1/29 @ 16:30 (which is day 8 of sx).  Colin Crock PA-C  [facility]

## 2019-06-01 ENCOUNTER — Encounter: Payer: Self-pay | Admitting: Nurse Practitioner

## 2019-06-01 ENCOUNTER — Telehealth (INDEPENDENT_AMBULATORY_CARE_PROVIDER_SITE_OTHER): Payer: Commercial Managed Care - PPO | Admitting: Nurse Practitioner

## 2019-06-01 ENCOUNTER — Telehealth: Payer: Self-pay

## 2019-06-01 ENCOUNTER — Other Ambulatory Visit: Payer: Self-pay

## 2019-06-01 VITALS — Temp 100.3°F | Ht 71.0 in

## 2019-06-01 DIAGNOSIS — U071 COVID-19: Secondary | ICD-10-CM

## 2019-06-01 DIAGNOSIS — B342 Coronavirus infection, unspecified: Secondary | ICD-10-CM

## 2019-06-01 NOTE — Telephone Encounter (Signed)
Result faxed as requested. 

## 2019-06-01 NOTE — Telephone Encounter (Signed)
Patient needs result faxed to [facility] Attn: Randall Hiss Fax: [phone]

## 2019-06-01 NOTE — Progress Notes (Signed)
Virtual Visit via Video   This visit type was conducted due to national recommendations for restrictions regarding the COVID-19 Pandemic (e.g. social distancing) in an effort to limit this patient's exposure and mitigate transmission in our community.  Due to his co-morbid illnesses, this patient is at least at moderate risk for complications without adequate follow up.  This format is felt to be most appropriate for this patient at this time.  All issues noted in this document were discussed and addressed.  A limited physical exam was performed with this format.    This visit type was conducted due to national recommendations for restrictions regarding the COVID-19 Pandemic (e.g. social distancing) in an effort to limit this patient's exposure and mitigate transmission in our community.  Patients identity confirmed using two different identifiers.  This format is felt to be most appropriate for this patient at this time.  All issues noted in this document were discussed and addressed.  No physical exam was performed (except for noted visual exam findings with Video Visits).    Date:  06/01/2019   ID:  Colin [name], DOB [birth year], MRN [id number]  Patient Location:  Home - spoke with Colin [name]  Provider location:   Office    Chief Complaint:  Positive covid test  History of Present Illness:    Colin [name] is a 64 y.o. male who presents via video conferencing for a telehealth visit today.    The patient does have symptoms concerning for COVID-19 infection (fever, chills, cough, or new shortness of breath).   He tested positive for covid on 05/28/2019, he works at [employer]  with a large number of people with covid.  He went for his ECHO on 05/28/2019, while he was out he went for a covid test. Went to [place] and took the covid test.  Now have increase in cough when he is talking. His temperature was 98.3, oxygen 92-96, blood pressure 117/84 this morning.  No shortness of breath.  He is  taking Tylenol as needed for the aches, Normal saline.  Diabetic tussin for his cough.  Zinc, vitamin c, vitamin d and Vitamin B.    He is scheduled to have the antibodies infusion tomorrow since he has had symptoms for less than 10 days and has mild symptoms.   He has been out of work since 05/28/2019.      Past Medical History:  Diagnosis Date  . Atrial flutter (HCC)   . DCM (dilated cardiomyopathy) (HCC)    EF 35-40% years ago but echo a year ago showed normal LVF  . Dilated aortic root (HCC) 04/15/2015   60mm by echo 06/2017 and Chest CT 12/2016  . Hypertension   . Obesities, morbid (HCC)   . OSA (obstructive sleep apnea)   . Persistent atrial fibrillation [facility])    s/p TEE/DCCV now on Sotolol   Past Surgical History:  Procedure Laterality Date  . CARDIOVERSION N/A 09/24/2014   Procedure: CARDIOVERSION;  Surgeon: Quintella Reichert, MD;  Location: [facility] ENDOSCOPY;  Service: Cardiovascular;  Laterality: N/A;  . CARDIOVERSION N/A 08/02/2017   Procedure: CARDIOVERSION;  Surgeon: Pricilla Riffle, MD;  Location[facility] ENDOSCOPY;  Service: Cardiovascular;  Laterality: N/A;  . TEE WITHOUT CARDIOVERSION N/A 09/24/2014   Procedure: TRANSESOPHAGEAL ECHOCARDIOGRAM (TEE);  Surgeon: Quintella Reichert, MD;  Location: [facility] ENDOSCOPY;  Service: Cardiovascular;  Laterality: N/A;     Current Meds  Medication Sig  . allopurinol (ZYLOPRIM) 100 MG tablet Take 1 tablet (100 mg total)  by mouth at bedtime.  Marland Kitchen apixaban (ELIQUIS) 5 MG TABS tablet TAKE 1 TABLET(5 MG) BY MOUTH TWICE DAILY  . atorvastatin (LIPITOR) 80 MG tablet TAKE 1 TABLET BY MOUTH EVERY DAY  . colchicine 0.6 MG tablet Take 1 tablet (0.6 mg total) by mouth daily as needed. colcrys  . dapagliflozin propanediol (FARXIGA) 5 MG TABS tablet Take 1 tablet by mouth daily  . levothyroxine (SYNTHROID, LEVOTHROID) 200 MCG tablet Take 1 tablet by mouth Monday - Saturday and 1/2 tablet on Sunday  . metoprolol tartrate (LOPRESSOR) 50 MG tablet Take 1 tablet (50 mg total)  by mouth 2 (two) times daily.  . Multiple Vitamins-Minerals (CENTRUM SILVER ADULT 50+ PO) Take 1 tablet by mouth daily.   Marland Kitchen NOVOTWIST 32G X 5 MM MISC See admin instructions.  Marland Kitchen OZEMPIC, 0.25 OR 0.5 MG/DOSE, 2 MG/1.5ML SOPN INJECT 0.5 MG UNDER THE SKIN EVERY WEEK ON THE SAME DAY OF EACH WEEK, IN THE ABDOMINAL, THIGH, OR UPPER ARM ROTATING INJECTION SITES  . sildenafil (VIAGRA) 100 MG tablet Take 1 tablet (100 mg total) by mouth daily as needed for erectile dysfunction.  Nelva Nay SOLOSTAR 300 UNIT/ML SOPN INJECT 40 UNITS UNDER THE SKIN AS PER INSULIN PROTOCOL     Allergies:   Patient has no known allergies.   Social History   Tobacco Use  . Smoking status: Former Smoker    Packs/day: 0.50    Years: 15.00    Pack years: 7.50    Types: Cigarettes  . Smokeless tobacco: Never Used  Substance Use Topics  . Alcohol use: No    Alcohol/week: 0.0 standard drinks  . Drug use: No     Family Hx: The patient's family history includes Heart Problems in his father and paternal grandfather; Stroke in his mother.  ROS:   Please see the history of present illness.    Review of Systems  Constitutional: Negative.   Respiratory: Positive for cough (when he talks alot.).   Cardiovascular: Negative.   Neurological: Negative for dizziness and tingling.  Psychiatric/Behavioral: Negative.     All other systems reviewed and are negative.   Labs/Other Tests and Data Reviewed:    Recent Labs: 12/08/2018: ALT 61; Hemoglobin 14.9; Platelets 197 04/07/2019: BUN 14; Creatinine, Ser 1.11; Potassium 4.7; Sodium 136; TSH 3.190   Recent Lipid Panel Lab Results  Component Value Date/Time   CHOL 206 (H) 07/14/2018 04:52 PM   TRIG 104 07/14/2018 04:52 PM   HDL 44 07/14/2018 04:52 PM   CHOLHDL 4.7 07/14/2018 04:52 PM   LDLCALC 141 (H) 07/14/2018 04:52 PM    Wt Readings from Last 3 Encounters:  05/15/19 252 lb (114.3 kg)  04/07/19 253 lb 3.2 oz (114.9 kg)  01/28/19 250 lb (113.4 kg)     Exam:     Vital Signs:  Temp 100.3 F (37.9 C) Comment: pt provided  Ht 5\' 11"  (1.803 m)   BMI 35.15 kg/m     Physical Exam  Constitutional: He is oriented to person, place, and time and well-developed, well-nourished, and in no distress. No distress.  Pulmonary/Chest: Effort normal. No respiratory distress.  No respiratory distress when he walked to the door at his home.  Neurological: He is alert and oriented to person, place, and time.  Psychiatric: Mood, memory, affect and judgment normal.    ASSESSMENT & PLAN:    1. Coronavirus infection  Diagnosed on 05/28/2019, has mild symptoms and he is scheduled to receive the antibodies infusion tomorrow.  He is advised if  he has significant shortness of breath to go to the ER. He can go back to work on Monday 06/08/2019 if no symptoms for 3 days prior.  Advised to use Flonase or nasacort as needed for nasal congestion or can take an over the counter antihistamine   COVID-19 Education: The signs and symptoms of COVID-19 were discussed with the patient and how to seek care for testing (follow up with PCP or arrange E-visit).  The importance of social distancing was discussed today.  Patient Risk:   After full review of this patients clinical status, I feel that they are at least moderate risk at this time.  Time:   Today, I have spent 10 minutes/ seconds with the patient with telehealth technology discussing above diagnoses.     Medication Adjustments/Labs and Tests Ordered: Current medicines are reviewed at length with the patient today.  Concerns regarding medicines are outlined above.   Tests Ordered: No orders of the defined types were placed in this encounter.   Medication Changes: No orders of the defined types were placed in this encounter.   Disposition:  Follow up prn  Signed, Arnette Felts, FNP

## 2019-06-02 ENCOUNTER — Ambulatory Visit (HOSPITAL_COMMUNITY): Admission: RE | Admit: 2019-06-02 | Payer: Commercial Managed Care - PPO | Source: Ambulatory Visit

## 2019-06-05 ENCOUNTER — Ambulatory Visit (HOSPITAL_COMMUNITY)
Admission: RE | Admit: 2019-06-05 | Discharge: 2019-06-05 | Disposition: A | Discharge status: Home / Self Care | Payer: Commercial Managed Care - PPO | Source: Ambulatory Visit | Attending: Pulmonary Disease | Admitting: Pulmonary Disease

## 2019-06-05 DIAGNOSIS — U071 COVID-19: Secondary | ICD-10-CM | POA: Diagnosis not present

## 2019-06-05 DIAGNOSIS — Z23 Encounter for immunization: Secondary | ICD-10-CM | POA: Diagnosis present

## 2019-06-05 MED ORDER — EPINEPHRINE 0.3 MG/0.3ML IJ SOAJ: 0.3000 mg | Freq: Once | INTRAMUSCULAR | Status: DC | PRN

## 2019-06-05 MED ORDER — METHYLPREDNISOLONE SODIUM SUCC 125 MG IJ SOLR: 125.0000 mg | Freq: Once | INTRAMUSCULAR | Status: DC | PRN

## 2019-06-05 MED ORDER — FAMOTIDINE IN NACL 20-0.9 MG/50ML-% IV SOLN: 20.0000 mg | Freq: Once | INTRAVENOUS | Status: DC | PRN

## 2019-06-05 MED ORDER — ALBUTEROL SULFATE HFA 108 (90 BASE) MCG/ACT IN AERS: 2.0000 | INHALATION_SPRAY | Freq: Once | RESPIRATORY_TRACT | Status: DC | PRN

## 2019-06-05 MED ORDER — SODIUM CHLORIDE 0.9 % IV SOLN
700.0000 mg | Freq: Once | INTRAVENOUS | Status: AC
  Administered 2019-06-05: 700 mg via INTRAVENOUS
  Filled 2019-06-05: qty 20

## 2019-06-05 MED ORDER — SODIUM CHLORIDE 0.9 % IV SOLN: INTRAVENOUS | Status: DC | PRN

## 2019-06-05 MED ORDER — DIPHENHYDRAMINE HCL 50 MG/ML IJ SOLN: 50.0000 mg | Freq: Once | INTRAMUSCULAR | Status: DC | PRN

## 2019-06-05 NOTE — Progress Notes (Signed)
  Diagnosis: COVID-19  Physician: Dr. Wright  Procedure: Covid Infusion Clinic Med: bamlanivimab infusion - Provided patient with bamlanimivab fact sheet for patients, parents and caregivers prior to infusion.  Complications: No immediate complications noted.  Discharge: Discharged home   Amoreena Neubert 06/05/2019   

## 2019-06-05 NOTE — Discharge Instructions (Signed)

## 2019-06-07 ENCOUNTER — Other Ambulatory Visit: Payer: Self-pay | Admitting: Internal Medicine

## 2019-07-01 ENCOUNTER — Other Ambulatory Visit: Payer: Self-pay

## 2019-07-01 MED ORDER — COLCHICINE 0.6 MG PO TABS: 0.6000 mg | ORAL_TABLET | Freq: Every day | ORAL | 2 refills | Status: AC | PRN

## 2019-07-01 NOTE — Telephone Encounter (Signed)
The pt said that he was having some foot pain and was wanting to know if he needed a f/u for covid because he was positive a month ago.  The pt was told that he should start his gout med back and that he didn't need a f/u for the covid unless he was having some issues and that he can have a f/u on the gout.  The pt said that he will start the gout med back and to send a refill and he will f/u with dr sanders at his next appt.

## 2019-07-28 ENCOUNTER — Other Ambulatory Visit (HOSPITAL_COMMUNITY): Payer: Self-pay | Admitting: Nurse Practitioner

## 2019-07-28 NOTE — Telephone Encounter (Signed)
Prescription refill request for Eliquis received.  Last office visit: [place], 05/15/2019 Scr: 1.11, 04/07/2019 Age: 64 y.o. Weight: 114.3 kg   Prescription refill sent.

## 2019-08-06 ENCOUNTER — Ambulatory Visit: Payer: PRIVATE HEALTH INSURANCE | Admitting: Internal Medicine

## 2019-08-07 ENCOUNTER — Ambulatory Visit: Payer: Commercial Managed Care - PPO

## 2019-08-10 ENCOUNTER — Ambulatory Visit: Payer: Commercial Managed Care - PPO | Admitting: Internal Medicine

## 2019-08-10 ENCOUNTER — Other Ambulatory Visit: Payer: Self-pay

## 2019-08-10 ENCOUNTER — Encounter: Payer: Self-pay | Admitting: Internal Medicine

## 2019-08-10 VITALS — BP 138/78 | HR 88 | Temp 98.0°F | Ht 69.2 in | Wt 236.2 lb

## 2019-08-10 DIAGNOSIS — E1122 Type 2 diabetes mellitus with diabetic chronic kidney disease: Secondary | ICD-10-CM

## 2019-08-10 DIAGNOSIS — N182 Chronic kidney disease, stage 2 (mild): Secondary | ICD-10-CM | POA: Diagnosis not present

## 2019-08-10 DIAGNOSIS — I1310 Hypertensive heart and chronic kidney disease without heart failure, with stage 1 through stage 4 chronic kidney disease, or unspecified chronic kidney disease: Secondary | ICD-10-CM | POA: Diagnosis not present

## 2019-08-10 DIAGNOSIS — M1A9XX0 Chronic gout, unspecified, without tophus (tophi): Secondary | ICD-10-CM

## 2019-08-10 DIAGNOSIS — I483 Typical atrial flutter: Secondary | ICD-10-CM | POA: Diagnosis not present

## 2019-08-10 DIAGNOSIS — E039 Hypothyroidism, unspecified: Secondary | ICD-10-CM

## 2019-08-10 DIAGNOSIS — Z8616 Personal history of COVID-19: Secondary | ICD-10-CM

## 2019-08-10 NOTE — Patient Instructions (Signed)
Gout  Gout is a condition that causes painful swelling of the joints. Gout is a type of inflammation of the joints (arthritis). This condition is caused by having too much uric acid in the body. Uric acid is a chemical that forms when the body breaks down substances called purines. Purines are important for building body proteins. When the body has too much uric acid, sharp crystals can form and build up inside the joints. This causes pain and swelling. Gout attacks can happen quickly and may be very painful (acute gout). Over time, the attacks can affect more joints and become more frequent (chronic gout). Gout can also cause uric acid to build up under the skin and inside the kidneys. What are the causes? This condition is caused by too much uric acid in your blood. This can happen because:  Your kidneys do not remove enough uric acid from your blood. This is the most common cause.  Your body makes too much uric acid. This can happen with some cancers and cancer treatments. It can also occur if your body is breaking down too many red blood cells (hemolytic anemia).  You eat too many foods that are high in purines. These foods include organ meats and some seafood. Alcohol, especially beer, is also high in purines. A gout attack may be triggered by trauma or stress. What increases the risk? You are more likely to develop this condition if you:  Have a family history of gout.  Are male and middle-aged.  Are male and have gone through menopause.  Are obese.  Frequently drink alcohol, especially beer.  Are dehydrated.  Lose weight too quickly.  Have an organ transplant.  Have lead poisoning.  Take certain medicines, including aspirin, cyclosporine, diuretics, levodopa, and niacin.  Have kidney disease.  Have a skin condition called psoriasis. What are the signs or symptoms? An attack of acute gout happens quickly. It usually occurs in just one joint. The most common place is  the big toe. Attacks often start at night. Other joints that may be affected include joints of the feet, ankle, knee, fingers, wrist, or elbow. Symptoms of this condition may include:  Severe pain.  Warmth.  Swelling.  Stiffness.  Tenderness. The affected joint may be very painful to touch.  Shiny, red, or purple skin.  Chills and fever. Chronic gout may cause symptoms more frequently. More joints may be involved. You may also have white or yellow lumps (tophi) on your hands or feet or in other areas near your joints. How is this diagnosed? This condition is diagnosed based on your symptoms, medical history, and physical exam. You may have tests, such as:  Blood tests to measure uric acid levels.  Removal of joint fluid with a thin needle (aspiration) to look for uric acid crystals.  X-rays to look for joint damage. How is this treated? Treatment for this condition has two phases: treating an acute attack and preventing future attacks. Acute gout treatment may include medicines to reduce pain and swelling, including:  NSAIDs.  Steroids. These are strong anti-inflammatory medicines that can be taken by mouth (orally) or injected into a joint.  Colchicine. This medicine relieves pain and swelling when it is taken soon after an attack. It can be given by mouth or through an IV. Preventive treatment may include:  Daily use of smaller doses of NSAIDs or colchicine.  Use of a medicine that reduces uric acid levels in your blood.  Changes to your diet. You may   need to see a dietitian about what to eat and drink to prevent gout. Follow these instructions at home: During a gout attack   If directed, put ice on the affected area: ? Put ice in a plastic bag. ? Place a towel between your skin and the bag. ? Leave the ice on for 20 minutes, 2-3 times a day.  Raise (elevate) the affected joint above the level of your heart as often as possible.  Rest the joint as much as possible.  If the affected joint is in your leg, you may be given crutches to use.  Follow instructions from your health care provider about eating or drinking restrictions. Avoiding future gout attacks  Follow a low-purine diet as told by your dietitian or health care provider. Avoid foods and drinks that are high in purines, including liver, kidney, anchovies, asparagus, herring, mushrooms, mussels, and beer.  Maintain a healthy weight or lose weight if you are overweight. If you want to lose weight, talk with your health care provider. It is important that you do not lose weight too quickly.  Start or maintain an exercise program as told by your health care provider. Eating and drinking  Drink enough fluids to keep your urine pale yellow.  If you drink alcohol: ? Limit how much you use to:  0-1 drink a day for women.  0-2 drinks a day for men. ? Be aware of how much alcohol is in your drink. In the [place] one drink equals one 12 oz bottle of beer (355 mL) one 5 oz glass of wine (148 mL), or one 1 oz glass of hard liquor (44 mL). General instructions  Take over-the-counter and prescription medicines only as told by your health care provider.  Do not drive or use heavy machinery while taking prescription pain medicine.  Return to your normal activities as told by your health care provider. Ask your health care provider what activities are safe for you.  Keep all follow-up visits as told by your health care provider. This is important. Contact a health care provider if you have:  Another gout attack.  Continuing symptoms of a gout attack after 10 days of treatment.  Side effects from your medicines.  Chills or a fever.  Burning pain when you urinate.  Pain in your lower back or belly. Get help right away if you:  Have severe or uncontrolled pain.  Cannot urinate. Summary  Gout is painful swelling of the joints caused by inflammation.  The most common site of pain is the big  toe, but it can affect other joints in the body.  Medicines and dietary changes can help to prevent and treat gout attacks. This information is not intended to replace advice given to you by your health care provider. Make sure you discuss any questions you have with your health care provider. Document Revised: 11/13/2017 Document Reviewed: 11/13/2017 Elsevier Patient Education  2020 Elsevier Inc.  

## 2019-08-10 NOTE — Progress Notes (Signed)
This visit occurred during the SARS-CoV-2 public health emergency.  Safety protocols were in place, including screening questions prior to the visit, additional usage of staff PPE, and extensive cleaning of exam room while observing appropriate contact time as indicated for disinfecting solutions.  Subjective:     Patient ID: Colin [name] , male    DOB: 1955-06-25 , 64 y.o.   MRN: [id number]   Chief Complaint  Patient presents with  . Diabetes  . Hypertension    HPI  Diabetes He presents for his follow-up diabetic visit. He has type 2 diabetes mellitus. His disease course has been improving. There are no hypoglycemic associated symptoms. Pertinent negatives for diabetes include no blurred vision and no chest pain. There are no hypoglycemic complications. Diabetic complications include nephropathy. Risk factors for coronary artery disease include diabetes mellitus, dyslipidemia, hypertension, sedentary lifestyle, male sex and obesity. He participates in exercise intermittently. His breakfast blood glucose is taken between 8-9 am. His breakfast blood glucose range is generally 140-180 mg/dl. An ACE inhibitor/angiotensin II receptor blocker is being taken. Eye exam is current.  Hypertension This is a chronic problem. The current episode started more than 1 year ago. The problem has been gradually improving since onset. The problem is controlled. Pertinent negatives include no blurred vision, chest pain, palpitations or shortness of breath.     Past Medical History:  Diagnosis Date  . Atrial flutter ([place])   . DCM (dilated cardiomyopathy) ([place])    EF 35-40% years ago but echo a year ago showed normal LVF  . Dilated aortic root ([place]) 04/15/2015   63m by echo 06/2017 and Chest CT 12/2016  . Hypertension   . [place]ities[place]   . OSA (obstructive sleep apnea)   . Persistent atrial fibrillation (HCC)    s/p TEE/DCCV now on Sotolol     Family History  Problem Relation Age of Onset  .  Stroke Mother   . Heart Problems Father        weak heart  . Heart Problems Paternal Grandfather        pacemaker     Current Outpatient Medications:  .  allopurinol (ZYLOPRIM) 100 MG tablet, Take 1 tablet (100 mg total) by mouth at bedtime., Disp: 30 tablet, Rfl: 1 .  atorvastatin (LIPITOR) 80 MG tablet, TAKE 1 TABLET BY MOUTH EVERY DAY, Disp: 90 tablet, Rfl: 1 .  B-D ULTRAFINE III SHORT PEN 31G X 8 MM MISC, USE AS DIRECTED WITH LEVEMIR PEN, Disp: 100 each, Rfl: 1 .  colchicine 0.6 MG tablet, Take 1 tablet (0.6 mg total) by mouth daily as needed. colcrys, Disp: 30 tablet, Rfl: 2 .  dapagliflozin propanediol (FARXIGA) 5 MG TABS tablet, Take 1 tablet by mouth daily, Disp: 30 tablet, Rfl: 3 .  ELIQUIS 5 MG TABS tablet, TAKE 1 TABLET BY MOUTH TWICE DAILY, Disp: 60 tablet, Rfl: 6 .  levothyroxine (SYNTHROID, LEVOTHROID) 200 MCG tablet, Take 1 tablet by mouth Monday - Saturday and 1/2 tablet on Sunday, Disp: 90 tablet, Rfl: 1 .  metoprolol tartrate (LOPRESSOR) 50 MG tablet, Take 1 tablet (50 mg total) by mouth 2 (two) times daily., Disp: 180 tablet, Rfl: 2 .  Multiple Vitamins-Minerals (CENTRUM SILVER ADULT 50+ PO), Take 1 tablet by mouth daily. , Disp: , Rfl:  .  NOVOTWIST 32G X 5 MM MISC, See admin instructions., Disp: , Rfl: 3 .  OZEMPIC, 0.25 OR 0.5 MG/DOSE, 2 MG/1.5ML SOPN, INJECT 0.5 MG UNDER THE SKIN EVERY WEEK ON THE SAME  DAY OF EACH WEEK, IN THE ABDOMINAL, THIGH, OR UPPER ARM ROTATING INJECTION SITES, Disp: 1.5 mL, Rfl: 5 .  sildenafil (VIAGRA) 100 MG tablet, Take 1 tablet (100 mg total) by mouth daily as needed for erectile dysfunction., Disp: 10 tablet, Rfl: 1 .  TOUJEO SOLOSTAR 300 UNIT/ML SOPN, INJECT 40 UNITS UNDER THE SKIN AS PER INSULIN PROTOCOL, Disp: 4.5 mL, Rfl: 4   No Known Allergies   Review of Systems  Constitutional: Negative.   Eyes: Negative for blurred vision.  Respiratory: Negative.  Negative for shortness of breath.   Cardiovascular: Negative.  Negative for chest  pain and palpitations.  Gastrointestinal: Negative.   Musculoskeletal: Positive for arthralgias.       He reports having frequent gouty attacks. Usually occur in his feet and wrist. He reports following a low-purine diet. Reports compliance with meds.   Neurological: Negative.   Psychiatric/Behavioral: Negative.      Today's Vitals   08/10/19 1620  BP: 138/78  Pulse: 88  Temp: 98 F (36.7 C)  TempSrc: Oral  Weight: 236 lb 3.2 oz (107.1 kg)  Height: 5' 9.2" (1.758 m)   Body mass index is 34.68 kg/m.   Wt Readings from Last 3 Encounters:  08/10/19 236 lb 3.2 oz (107.1 kg)  05/15/19 252 lb (114.3 kg)  04/07/19 253 lb 3.2 oz (114.9 kg)     Objective:  Physical Exam Vitals and nursing note reviewed.  Constitutional:      Appearance: Normal appearance. He is obese.  Cardiovascular:     Rate and Rhythm: Normal rate and regular rhythm.     Heart sounds: Normal heart sounds.  Pulmonary:     Effort: Pulmonary effort is normal.     Breath sounds: Normal breath sounds.  Skin:    General: Skin is warm.  Neurological:     General: No focal deficit present.     Mental Status: He is alert.  Psychiatric:        Mood and Affect: Mood normal.         Assessment And Plan:     1. Diabetes mellitus with stage 2 chronic kidney disease (HCC)  Chronic, last a1c was elevated. He reports making some dietary changes. I am hoping to see some improvement with today's labs. He is encouraged to incorporate more exercise into his daily routine.   - Hemoglobin A1c  2. Hypertensive heart and renal disease with renal failure, stage 1 through stage 4 or unspecified chronic kidney disease, without heart failure  Chronic, fair control. Patient is aware that optimal BP reading is less than 120/80. He will continue with current meds.   - TSH - BMP8+EGFR  3. Typical atrial flutter (HCC)  Chronic, rate controlled. He is anticoagulated.   4. Primary hypothyroidism  Chronic. I will check  TSH today.   5. Personal history of covid-19  He is scheduled to receive his first COVID vaccine in May. He has to wait 90 days since he received monoclonal antibody as part of his treatment.   6. Chronic gout involving toe without tophus, unspecified cause, unspecified laterality  Chronic. I will check uric acid level today.     Maximino Greenland, MD    THE PATIENT IS ENCOURAGED TO PRACTICE SOCIAL DISTANCING DUE TO THE COVID-19 PANDEMIC.

## 2019-08-11 LAB — BMP8+EGFR
BUN/Creatinine Ratio: 17 (ref 10–24)
BUN: 17 mg/dL (ref 8–27)
CO2: 20 mmol/L (ref 20–29)
Calcium: 9.5 mg/dL (ref 8.6–10.2)
Chloride: 105 mmol/L (ref 96–106)
Creatinine, Ser: 0.99 mg/dL (ref 0.76–1.27)
GFR calc Af Amer: 94 mL/min/{1.73_m2} (ref >59)
GFR calc non Af Amer: 81 mL/min/{1.73_m2} (ref >59)
Glucose: 88 mg/dL (ref 65–99)
Potassium: 4.1 mmol/L (ref 3.5–5.2)
Sodium: 142 mmol/L (ref 134–144)

## 2019-08-11 LAB — URIC ACID: Uric Acid: 6.8 mg/dL (ref 3.8–8.4)

## 2019-08-11 LAB — HEMOGLOBIN A1C
Est. average glucose Bld gHb Est-mCnc: 174 mg/dL
Hgb A1c MFr Bld: 7.7 % — ABNORMAL HIGH (ref 4.8–5.6)

## 2019-08-11 LAB — TSH: TSH: 2.61 u[IU]/mL (ref 0.450–4.500)

## 2019-08-13 ENCOUNTER — Other Ambulatory Visit: Payer: Self-pay

## 2019-08-13 MED ORDER — ALLOPURINOL 100 MG PO TABS: 100.0000 mg | ORAL_TABLET | Freq: Two times a day (BID) | ORAL | 1 refills | Status: AC

## 2019-08-21 ENCOUNTER — Other Ambulatory Visit: Payer: Self-pay | Admitting: Internal Medicine

## 2019-08-27 ENCOUNTER — Other Ambulatory Visit: Payer: Self-pay | Admitting: Internal Medicine

## 2019-09-01 ENCOUNTER — Ambulatory Visit: Payer: Self-pay

## 2019-09-07 ENCOUNTER — Ambulatory Visit: Payer: Commercial Managed Care - PPO | Attending: Internal Medicine

## 2019-09-07 DIAGNOSIS — Z23 Encounter for immunization: Secondary | ICD-10-CM

## 2019-09-07 NOTE — Progress Notes (Signed)
   Covid-19 Vaccination Clinic  Name:  KIRBY CORTESE    MRN: [id number] DOB: Oct 05, 1955  09/07/2019  Mr. Turnage was observed post Covid-19 immunization for 15 minutes without incident. He was provided with Vaccine Information Sheet and instruction to access the V-Safe system.   Mr. Bazen was instructed to call 911 with any severe reactions post vaccine: Marland Kitchen Difficulty breathing  . Swelling of face and throat  . A fast heartbeat  . A bad rash all over body  . Dizziness and weakness   Immunizations Administered    Name Date Dose VIS Date Route   Pfizer COVID-19 Vaccine 09/07/2019  4:52 PM 0.3 mL 07/01/2018 Intramuscular   Manufacturer: [employer], [employer]   Lot: [id number]   NDC: [address]-7

## 2019-09-09 ENCOUNTER — Other Ambulatory Visit: Payer: Self-pay

## 2019-09-09 ENCOUNTER — Encounter: Payer: Self-pay | Admitting: Internal Medicine

## 2019-09-09 ENCOUNTER — Ambulatory Visit: Payer: Commercial Managed Care - PPO | Admitting: Internal Medicine

## 2019-09-09 VITALS — BP 120/78 | HR 80 | Temp 98.1°F | Ht 69.2 in | Wt 240.2 lb

## 2019-09-09 DIAGNOSIS — I4819 Other persistent atrial fibrillation: Secondary | ICD-10-CM

## 2019-09-09 DIAGNOSIS — I77810 Thoracic aortic ectasia: Secondary | ICD-10-CM | POA: Diagnosis not present

## 2019-09-09 DIAGNOSIS — M1A9XX0 Chronic gout, unspecified, without tophus (tophi): Secondary | ICD-10-CM | POA: Diagnosis not present

## 2019-09-09 DIAGNOSIS — Z6835 Body mass index (BMI) 35.0-35.9, adult: Secondary | ICD-10-CM

## 2019-09-09 DIAGNOSIS — M25562 Pain in left knee: Secondary | ICD-10-CM

## 2019-09-09 MED ORDER — SILDENAFIL CITRATE 100 MG PO TABS: 100.0000 mg | ORAL_TABLET | Freq: Every day | ORAL | 1 refills | Status: AC | PRN

## 2019-09-09 NOTE — Patient Instructions (Signed)
Knee Sprain  A knee sprain is a stretch or tear in a knee ligament. Knee ligaments are bands of tissue that connect bones in the knee to each other. Follow these instructions at home: If you have a splint or brace:  Wear the splint or brace as told by your doctor. Remove it only as told by your doctor.  Loosen the splint or brace if your toes tingle, get numb, or turn cold and blue.  Keep the splint or brace clean.  If the splint or brace is not waterproof: ? Do not let it get wet. ? Cover it with a watertight covering when you take a bath or a shower. If you have a cast:  Do not stick anything inside the cast to scratch your skin.  Check the skin around the cast every day. Tell your doctor about any concerns.  You may put lotion on dry skin around the edges of the cast. Do not put lotion on the skin underneath the cast.  Keep the cast clean.  If the cast is not waterproof: ? Do not let it get wet. ? Cover it with a watertight covering when you take a bath or a shower. Managing pain, stiffness, and swelling   Gently move your toes often to avoid stiffness and to lessen swelling.  Raise (elevate) the injured area above the level of your heart while you are sitting or lying down.  Take over-the-counter and prescription medicines only as told by your doctor.  If directed, put ice on the injured area. ? If you have a removable splint or brace, remove it as told by your doctor. ? Put ice in a plastic bag. ? Place a towel between your skin and the bag or between your cast and the bag. ? Leave the ice on for 20 minutes, 2-3 times a day. General instructions  Do exercises as told by your doctor.  Keep all follow-up visits as told by your doctor. This is important. Contact a doctor if:  You have pain that gets worse.  The cast, brace, or splint does not fit right.  The cast, brace, or splint gets damaged. Get help right away if:  You cannot lean on your knee to stand or  walk.  You cannot move the injured area.  You knee buckles or you have pain after you walk only a few steps.  You have very bad pain, swelling, or numbness below the cast, brace, or splint. Summary  A knee sprain is a stretch or tear in a band (ligament) that connects your knee bones to each other.  You may need to wear a splint, brace, or cast to help your knee get better.  Contact your doctor if you have very bad pain, swelling, or numbness, or if you cannot walk. This information is not intended to replace advice given to you by your health care provider. Make sure you discuss any questions you have with your health care provider. Document Revised: 08/15/2018 Document Reviewed: 01/10/2016 Elsevier Patient Education  2020 Elsevier Inc.  

## 2019-09-10 ENCOUNTER — Ambulatory Visit: Payer: Commercial Managed Care - PPO | Admitting: Internal Medicine

## 2019-09-10 LAB — BMP8+EGFR
BUN/Creatinine Ratio: 10 (ref 10–24)
BUN: 11 mg/dL (ref 8–27)
CO2: 21 mmol/L (ref 20–29)
Calcium: 9.6 mg/dL (ref 8.6–10.2)
Chloride: 104 mmol/L (ref 96–106)
Creatinine, Ser: 1.07 mg/dL (ref 0.76–1.27)
GFR calc Af Amer: 86 mL/min/{1.73_m2} (ref >59)
GFR calc non Af Amer: 74 mL/min/{1.73_m2} (ref >59)
Glucose: 123 mg/dL — ABNORMAL HIGH (ref 65–99)
Potassium: 4.1 mmol/L (ref 3.5–5.2)
Sodium: 140 mmol/L (ref 134–144)

## 2019-09-10 LAB — URIC ACID: Uric Acid: 6.1 mg/dL (ref 3.8–8.4)

## 2019-09-12 NOTE — Progress Notes (Signed)
This visit occurred during the SARS-CoV-2 public health emergency.  Safety protocols were in place, including screening questions prior to the visit, additional usage of staff PPE, and extensive cleaning of exam room while observing appropriate contact time as indicated for disinfecting solutions.  Subjective:     Patient ID: Colin [name] , male    DOB: Aug 20, 1955 , 63 y.o.   MRN: [id number]   Chief Complaint  Patient presents with  . Gout    HPI  He is here today for f/u gout. The dose of allupurinol was increased to 221m. He has tolerated medication without any side effects. He has not had any gout attacks since his last visit.     Past Medical History:  Diagnosis Date  . [place]   . [place]    EF 35-40% years ago but echo a year ago showed normal LVF  . [place] ([place] 04/15/2015   451mby echo 06/2017 and Chest CT 12/2016  . Hypertension   . Obesities, morbid ([place]  . OSA (obstructive sleep apnea)   . Persistent atrial fibrillation (HCC)    s/p TEE/DCCV now on Sotolol     Family History  Problem Relation Age of Onset  . Stroke Mother   . Heart Problems Father        weak heart  . Heart Problems Paternal Grandfather        pacemaker     Current Outpatient Medications:  .  allopurinol (ZYLOPRIM) 100 MG tablet, Take 1 tablet (100 mg total) by mouth 2 (two) times daily., Disp: 180 tablet, Rfl: 1 .  Ascorbic Acid (VITAMIN C PO), Take by mouth., Disp: , Rfl:  .  atorvastatin (LIPITOR) 80 MG tablet, TAKE 1 TABLET BY MOUTH EVERY DAY, Disp: 90 tablet, Rfl: 1 .  B-D ULTRAFINE III SHORT PEN 31G X 8 MM MISC, USE AS DIRECTED WITH LEVEMIR PEN, Disp: 100 each, Rfl: 1 .  colchicine 0.6 MG tablet, Take 1 tablet (0.6 mg total) by mouth daily as needed. colcrys, Disp: 30 tablet, Rfl: 2 .  dapagliflozin propanediol (FARXIGA) 5 MG TABS tablet, Take 1 tablet by mouth daily, Disp: 30 tablet, Rfl: 3 .  ELIQUIS 5 MG TABS tablet, TAKE 1  TABLET BY MOUTH TWICE DAILY, Disp: 60 tablet, Rfl: 6 .  levothyroxine (SYNTHROID, LEVOTHROID) 200 MCG tablet, Take 1 tablet by mouth Monday - Saturday and 1/2 tablet on Sunday, Disp: 90 tablet, Rfl: 1 .  metoprolol tartrate (LOPRESSOR) 50 MG tablet, Take 1 tablet (50 mg total) by mouth 2 (two) times daily., Disp: 180 tablet, Rfl: 2 .  Multiple Vitamins-Minerals (ZINC PO), Take by mouth., Disp: , Rfl:  .  OZEMPIC, 0.25 OR 0.5 MG/DOSE, 2 MG/1.5ML SOPN, INJECT 0.5 MG UNDER THE SKIN EVERY WEEK ON THE SAME DAY OF EACH WEEK, IN THE ABDOMINAL, THIGH, OR UPPER ARM ROTATING INJECTION SITES, Disp: 1.5 mL, Rfl: 1 .  TOUJEO SOLOSTAR 300 UNIT/ML Solostar Pen, INJECT 40 UNITS INTO THE SKIN AS PER INSULIN PROTOCOL, Disp: 4.5 mL, Rfl: 4 .  VITAMIN D PO, Take by mouth., Disp: , Rfl:  .  sildenafil (VIAGRA) 100 MG tablet, Take 1 tablet (100 mg total) by mouth daily as needed for erectile dysfunction., Disp: 10 tablet, Rfl: 1   No Known Allergies   Review of Systems  Constitutional: Negative.   Respiratory: Negative.   Cardiovascular: Negative.   Gastrointestinal: Negative.   Musculoskeletal: Positive for arthralgias.  He c/o left knee pain. He states he fell at work last Thursday. He was walking down some aluminum steps, and something caught his foot. He was seen at [facility] for workmen's comp. He is now unable to walk without assistance. He is now using a walker. Scheduled to see Ortho on Th. He states he is in a lot of pain. Tylenol is not effective.    Neurological: Negative.   Psychiatric/Behavioral: Negative.      Today's Vitals   09/09/19 1638  BP: 120/78  Pulse: 80  Temp: 98.1 F (36.7 C)  TempSrc: Oral  Weight: 240 lb 3.2 oz (109 kg)  Height: 5' 9.2" (1.758 m)   Body mass index is 35.27 kg/m.   Objective:  Physical Exam Vitals and nursing note reviewed.  Constitutional:      Appearance: Normal appearance.  Cardiovascular:     Rate and Rhythm: Normal rate. Rhythm irregular.      Heart sounds: Normal heart sounds.  Pulmonary:     Effort: Pulmonary effort is normal.     Breath sounds: Normal breath sounds.  Musculoskeletal:     Comments: Ambulatory with walker  Skin:    General: Skin is warm.  Neurological:     General: No focal deficit present.     Mental Status: He is alert.  Psychiatric:        Mood and Affect: Mood normal.         Assessment And Plan:   1. 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 I will check renal function and uric acid level.   - Uric acid - BMP8+EGFR  2. Acute pain of left knee  S/p fall from work. He is scheduled to see Ortho on Thursday. He states this is now being followed through [employer] He declines pain meds today.   3. Ascending aorta dilatation (HCC)  Chronic, CT scan reviewed. He will have f/u MR Angio Chest later this year.   4. Class 2 severe obesity due to excess calories with serious comorbidity and body mass index (BMI) of 35.0 to 35.9 in adult ([place]  He is encouraged to strive for BMI less than 30 to decrease cardiac risk. He is encouraged to aim for at least 30 minutes five days per week once his knee has healed.   5. Persistent atrial fibrillation (HCC)  Chronic, yet stable. Rate controlled. He is properly anticoagulated.   RMaximino Greenland MD    THE PATIENT IS ENCOURAGED TO PRACTICE SOCIAL DISTANCING DUE TO THE COVID-19 PANDEMIC.

## 2019-09-28 ENCOUNTER — Ambulatory Visit: Payer: Commercial Managed Care - PPO | Attending: Internal Medicine

## 2019-09-28 DIAGNOSIS — Z23 Encounter for immunization: Secondary | ICD-10-CM

## 2019-09-28 NOTE — Progress Notes (Signed)
° °  Covid-19 Vaccination Clinic  Name:  Colin [name]    MRN: [id number] DOB: 07-30-1955  09/28/2019  Colin [name] was observed post Covid-19 immunization for 15 minutes without incident. He was provided with Vaccine Information Sheet and instruction to access the V-Safe system.   Colin [name] was instructed to call 911 with any severe reactions post vaccine:  Difficulty breathing   Swelling of face and throat   A fast heartbeat   A bad rash all over body   Dizziness and weakness   Immunizations Administered    Name Date Dose VIS Date Route   Pfizer COVID-19 Vaccine 09/28/2019  3:30 PM 0.3 mL 07/01/2018 Intramuscular   Manufacturer: [employer], [employer]   Lot: [id number]   NDC: [address]-3

## 2019-10-06 ENCOUNTER — Other Ambulatory Visit: Payer: Self-pay

## 2019-10-06 MED ORDER — ATORVASTATIN CALCIUM 80 MG PO TABS: 80.0000 mg | ORAL_TABLET | Freq: Every day | ORAL | 1 refills | Status: DC

## 2019-10-20 ENCOUNTER — Other Ambulatory Visit: Payer: Self-pay | Admitting: Internal Medicine

## 2019-11-03 ENCOUNTER — Telehealth: Payer: Self-pay | Admitting: *Deleted

## 2019-11-03 NOTE — Telephone Encounter (Signed)
   [facility] Pre-operative Risk Assessment    HEARTCARE STAFF: - Please ensure there is not already an duplicate clearance open for this procedure. - Under Visit Info/Reason for Call, type in Other and utilize the format Clearance MM/DD/YY or Clearance TBD. Do not use dashes or single digits. - If request is for dental extraction, please clarify the # of teeth to be extracted.  Request for surgical clearance:  1. What type of surgery is being performed?  LEFT MEDIAL UNICOMPARTMENTAL KNEE ORTHROPLASTY   2. When is this surgery scheduled?  11/24/2019   3. What type of clearance is required (medical clearance vs. Pharmacy clearance to hold med vs. Both)?   BOTH  4. Are there any medications that need to be held prior to surgery and how long? ELIQUIS    5. Practice name and name of physician performing surgery?  [facility] / DR. OLIN   6. What is the office phone number?  [phone]   7.   What is the office fax number?  [phone]  8.   Anesthesia type (None, local, MAC, general) ?    Jeanann Lewandowsky 11/03/2019, 12:44 PM  _________________________________________________________________   (provider comments below)

## 2019-11-03 NOTE — Telephone Encounter (Addendum)
Patient with diagnosis of A Fib on Eliquis for anticoagulation.    Procedure:  LEFT MEDIAL UNICOMPARTMENTAL KNEE ARTHROPLASTY  Date of procedure: 11/24/19  CHADS2-VASc score of  5 (CHF, HTN, DM2, stroke/tia x 2)  Stroke in January 2018   CrCl: 86 mL/min using adjusted body weight  Typically hold Eliquis for 3 days before TKA. However, since patient has not been seen at [facility] in over a year and has history of stroke, will defer to cardiologist after patient is seen at appointment.

## 2019-11-03 NOTE — Telephone Encounter (Signed)
   Primary Cardiologist:Traci Turner, MD  Chart reviewed as part of pre-operative protocol coverage. Because of Colin [name]'s past medical history and time since last visit, they will require a follow-up visit in order to better assess preoperative cardiovascular risk.  Patient has history of now permanent atrial fib/flutter, dilated cardiomyopathy with normalization of LVEF, OSA, dilated aortic root, morbid obesity. He has not had an in-person visit in the last 12 months; last seen in person in afib clinic in 04/2018. Had tele-health only visit in 05/2019 for sleep apnea follow-up.  Pre-op covering staff: - Please schedule appointment and call patient to inform them. - Please contact requesting surgeon's office via preferred method (i.e, phone, fax) to inform them of need for appointment prior to surgery.  If applicable, this message will also be routed to pharmacy pool for input on holding Eliquis as requested below so that this information is available to the clearing provider at time of patient's appointment.   Laurann Montana, PA-C  11/03/2019, 2:17 PM

## 2019-11-03 NOTE — Telephone Encounter (Signed)
Call placed to pt re: surgical clearance.  Pt has been scheduled to see Joni Reining, NP 11/10/19 at 2:30 with understanding to arrive at 2:00 for registration.  Will route back to the requesting surgeon's office to make them aware.

## 2019-11-05 ENCOUNTER — Other Ambulatory Visit: Payer: Self-pay

## 2019-11-05 ENCOUNTER — Ambulatory Visit: Payer: Commercial Managed Care - PPO | Admitting: Internal Medicine

## 2019-11-05 VITALS — BP 138/72 | HR 71 | Temp 98.0°F | Ht 70.0 in | Wt 246.8 lb

## 2019-11-05 DIAGNOSIS — I1310 Hypertensive heart and chronic kidney disease without heart failure, with stage 1 through stage 4 chronic kidney disease, or unspecified chronic kidney disease: Secondary | ICD-10-CM | POA: Diagnosis not present

## 2019-11-05 DIAGNOSIS — I483 Typical atrial flutter: Secondary | ICD-10-CM

## 2019-11-05 DIAGNOSIS — Z01818 Encounter for other preprocedural examination: Secondary | ICD-10-CM

## 2019-11-07 LAB — HEMOGLOBIN A1C
Est. average glucose Bld gHb Est-mCnc: 180 mg/dL
Hgb A1c MFr Bld: 7.9 % — ABNORMAL HIGH (ref 4.8–5.6)

## 2019-11-07 LAB — CBC
Hematocrit: 48.9 % (ref 37.5–51.0)
Hemoglobin: 15.6 g/dL (ref 13.0–17.7)
MCH: 27.1 pg (ref 26.6–33.0)
MCHC: 31.9 g/dL (ref 31.5–35.7)
MCV: 85 fL (ref 79–97)
Platelets: 221 10*3/uL (ref 150–450)
RBC: 5.76 x10E6/uL (ref 4.14–5.80)
RDW: 15.2 % (ref 11.6–15.4)
WBC: 11 10*3/uL — ABNORMAL HIGH (ref 3.4–10.8)

## 2019-11-07 LAB — BMP8+EGFR
BUN/Creatinine Ratio: 12 (ref 10–24)
BUN: 14 mg/dL (ref 8–27)
CO2: 21 mmol/L (ref 20–29)
Calcium: 9.7 mg/dL (ref 8.6–10.2)
Chloride: 101 mmol/L (ref 96–106)
Creatinine, Ser: 1.19 mg/dL (ref 0.76–1.27)
GFR calc Af Amer: 75 mL/min/{1.73_m2} (ref >59)
GFR calc non Af Amer: 65 mL/min/{1.73_m2} (ref >59)
Glucose: 246 mg/dL — ABNORMAL HIGH (ref 65–99)
Potassium: 4.7 mmol/L (ref 3.5–5.2)
Sodium: 141 mmol/L (ref 134–144)

## 2019-11-07 LAB — ALBUMIN: Albumin: 4.5 g/dL (ref 3.8–4.8)

## 2019-11-08 NOTE — Progress Notes (Signed)
Cardiology Office Note   Date:  11/08/2019   ID:  Colin [name], DOB [birth year], MRN [id number]  PCP:  Dorothyann Peng, MD  Cardiologist: Dr. Mayford Knife. No chief complaint on file.    History of Present Illness: Colin [name] is a 64 y.o. male who presents for preoperative evaluation with planned left medial unicompartmental knee arthroplasty planned on 11/24/2019.  The patient has a history of a CVA in January 2018, hypertension, CHF, and diabetes.  He is here for recommendations concerning Eliquis preoperatively.  On 11/03/2019 pharmacy review discharge and advised the patient to hold Eliquis 3 days before procedure.  He is here for cardiology assessment as he had not been seen in over a year.  [name] has other history to include moderate OSA on CPAP, paroxysmal atrial flutter and atrial fibrillation followed in the A. fib clinic.  He is status post ablation.  He also has a history of dilated aortic root and hypertension.  He failed sotalol, and was treated with rate control.  On last office visit with Dr. Mayford Knife dated 05/15/2019, he was doing well without any complaints.  He was not always compliant with CPAP, but when he used it he felt more rested and did not have any daytime sleepiness.  He was found to have well-controlled blood pressure and was continued on Lopressor 50 mg twice daily.  He was continued on Eliquis 5 mg twice daily.  He will need a BMET to assess kidney function.  The patient was also noted on 2D echo in 2019 which showed an ascending aorta at 42 mm and an aortic root of 40 mm which was stable from 2019.  CT scan on 07/25/2018 revealed a 4 cm ascending aortic aneurysm.  Repeat echocardiogram was ordered.  Echocardiogram revealed normal heart function with mildly thickened myocardium.  He had mild mitral valve tricuspid valve and aortic valve regurgitation and mildly dilated ascending aorta at 4 cm which corresponded to CT scan.  He was to have a follow-up echo in January  2022.  He comes today without any cardiac complaints.  He has not been very active due to his left knee discomfort.  Uses a cane for ambulation.  He denies dyspnea on exertion, chest discomfort, palpitations, or dizziness.  He denies any excessive bleeding or bruising.  Past Medical History:  Diagnosis Date  . Atrial flutter (HCC)   . DCM (dilated cardiomyopathy) (HCC)    EF 35-40% years ago but echo a year ago showed normal LVF  . Dilated aortic root (HCC) 04/15/2015   67mm by echo 06/2017 and Chest CT 12/2016  . Hypertension   . Obesities, morbid (HCC)   . OSA (obstructive sleep apnea)   . Persistent atrial fibrillation [facility])    s/p TEE/DCCV now on Sotolol    Past Surgical History:  Procedure Laterality Date  . CARDIOVERSION N/A 09/24/2014   Procedure: CARDIOVERSION;  Surgeon: Quintella Reichert, MD;  Location: [facility] ENDOSCOPY;  Service: Cardiovascular;  Laterality: N/A;  . CARDIOVERSION N/A 08/02/2017   Procedure: CARDIOVERSION;  Surgeon: Pricilla Riffle, MD;  Location[facility] ENDOSCOPY;  Service: Cardiovascular;  Laterality: N/A;  . TEE WITHOUT CARDIOVERSION N/A 09/24/2014   Procedure: TRANSESOPHAGEAL ECHOCARDIOGRAM (TEE);  Surgeon: Quintella Reichert, MD;  Location: [facility] ENDOSCOPY;  Service: Cardiovascular;  Laterality: N/A;     Current Outpatient Medications  Medication Sig Dispense Refill  . acetaminophen (TYLENOL) 500 MG tablet Take 1,000 mg by mouth every 6 (six) hours as needed for moderate pain or  headache.    . allopurinol (ZYLOPRIM) 100 MG tablet Take 1 tablet (100 mg total) by mouth 2 (two) times daily. (Patient taking differently: Take 100 mg by mouth 2 (two) times daily as needed (gout). ) 180 tablet 1  . Ascorbic Acid (VITAMIN C PO) Take 1 tablet by mouth daily.     Marland Kitchen atorvastatin (LIPITOR) 80 MG tablet Take 1 tablet (80 mg total) by mouth daily. 90 tablet 1  . B-D ULTRAFINE III SHORT PEN 31G X 8 MM MISC USE AS DIRECTED WITH LEVEMIR PEN 100 each 1  . colchicine 0.6 MG tablet Take 1  tablet (0.6 mg total) by mouth daily as needed. colcrys (Patient taking differently: Take 0.6 mg by mouth daily as needed (gout). ) 30 tablet 2  . dapagliflozin propanediol (FARXIGA) 5 MG TABS tablet Take 1 tablet by mouth daily (Patient taking differently: Take 5 mg by mouth daily. ) 30 tablet 3  . ELIQUIS 5 MG TABS tablet TAKE 1 TABLET BY MOUTH TWICE DAILY (Patient taking differently: Take 5 mg by mouth 2 (two) times daily. ) 60 tablet 6  . levothyroxine (SYNTHROID, LEVOTHROID) 200 MCG tablet Take 1 tablet by mouth Monday - Saturday and 1/2 tablet on Sunday (Patient taking differently: Take 100-200 mcg by mouth See admin instructions. Take 200 mcg Monday through Saturday and 100 mcg on Sundays) 90 tablet 1  . metoprolol tartrate (LOPRESSOR) 50 MG tablet Take 1 tablet (50 mg total) by mouth 2 (two) times daily. 180 tablet 2  . OZEMPIC, 0.25 OR 0.5 MG/DOSE, 2 MG/1.5ML SOPN INJECT 0.5 MG UNDER THE SKIN EVERY WEEK ON THE SAME DAY OF EACH WEEK, IN THE ABDOMINAL, THIGH, OR UPPER ARM ROTATING INJECTION SITES (Patient taking differently: Inject 0.5 mg as directed every Thursday. ) 1.5 mL 1  . sildenafil (VIAGRA) 100 MG tablet Take 1 tablet (100 mg total) by mouth daily as needed for erectile dysfunction. 10 tablet 1  . TOUJEO SOLOSTAR 300 UNIT/ML Solostar Pen INJECT 40 UNITS INTO THE SKIN AS PER INSULIN PROTOCOL (Patient taking differently: Inject 40 Units into the skin at bedtime. ) 4.5 mL 4   No current facility-administered medications for this visit.    Allergies:   Patient has no known allergies.    Social History:  The patient  reports that he has quit smoking. His smoking use included cigarettes. He has a 7.50 pack-year smoking history. He has never used smokeless tobacco. He reports that he does not drink alcohol and does not use drugs.   Family History:  The patient's family history includes Heart Problems in his father and paternal grandfather; Stroke in his mother.    ROS: All other systems  are reviewed and negative. Unless otherwise mentioned in H&P    PHYSICAL EXAM: VS:  There were no vitals taken for this visit. , BMI There is no height or weight on file to calculate BMI. GEN: Well nourished, well developed, in no acute distress HEENT: normal Neck: no JVD, carotid bruits, or masses Cardiac: IRRR; tachycardic, no murmurs, rubs, or gallops, mild unilateral left knee edema  Respiratory:  Clear to auscultation bilaterally, normal work of breathing GI: soft, nontender, nondistended, + BS MS: no deformity or atrophy Skin: warm and dry, no rash Neuro:  Strength and sensation are intact Psych: euthymic mood, full affect   EKG: Atrial fibrillation heart rate 106 bpm, with PVCs.  (Personally reviewed)  Recent Labs: 12/08/2018: ALT 61 08/10/2019: TSH 2.610 11/05/2019: BUN 14; Creatinine, Ser 1.19; Hemoglobin 15.6;  Platelets 221; Potassium 4.7; Sodium 141    Lipid Panel    Component Value Date/Time   CHOL 206 (H) 07/14/2018 1652   TRIG 104 07/14/2018 1652   HDL 44 07/14/2018 1652   CHOLHDL 4.7 07/14/2018 1652   LDLCALC 141 (H) 07/14/2018 1652      Wt Readings from Last 3 Encounters:  11/05/19 246 lb 12.8 oz (111.9 kg)  09/09/19 240 lb 3.2 oz (109 kg)  08/10/19 236 lb 3.2 oz (107.1 kg)      Other studies Reviewed: Echocardiogram June 05, 2019 1. Left ventricular ejection fraction, by visual estimation, is 55 to  60%. The left ventricle has normal function. There is mildly increased  left ventricular hypertrophy.  2. Left ventricular diastolic parameters are indeterminate.  3. The left ventricle has no regional wall motion abnormalities.  4. Global right ventricle has normal systolic function.The right  ventricular size is normal. No increase in right ventricular wall  thickness.  5. Left atrial size was normal.  6. Right atrial size was mildly dilated.  7. Mild mitral annular calcification.  8. The mitral valve is grossly normal. Mild mitral valve  regurgitation.  9. The tricuspid valve is normal in structure.  10. The tricuspid valve is normal in structure. Tricuspid valve  regurgitation is mild.  11. The aortic valve is normal in structure. Aortic valve regurgitation is  mild.  12. The pulmonic valve was grossly normal. Pulmonic valve regurgitation is  not visualized.  13. Aortic dilatation noted.  14. There is mild dilatation of the ascending aorta measuring 40 mm.  15. Normal pulmonary artery systolic pressure.  16. The inferior vena cava is normal in size with greater than 50%  respiratory variability, suggesting right atrial pressure of 3 mmHg.   ASSESSMENT AND PLAN:  1. Pre-Operative Evaluation:   Chart reviewed as part of pre-operative protocol coverage. Given past medical history and time since last visit, based on ACC/AHA guidelines, Onesimo E Kozlowski would be at acceptable risk for the planned procedure without further cardiovascular testing.  He should hold Eliquis 3 days prior to his procedure.  Based upon notes, he will hold on 11/21/2019, and resume as soon as possible after procedure which is scheduled for 11/24/2019.  This is been explained to him he verbalizes understanding.  2.  Atrial fibrillation: Heart rate is currently well controlled on metoprolol tartrate 50 mg twice daily.  Continue Eliquis with exception of instructions preoperatively.  3.  Hyperlipidemia: Continues on atorvastatin 80 mg daily.  Recommend LDL less than 100.  Labs are drawn by PCP.  4. Insulin-dependent diabetes: Followed by PCP.  5. OSA: On CPAP. Followed by Dr.Turner     Current medicines are reviewed at length with the patient today.  I have spent 25 minutes dedicated to the care of this patient on the date of this encounter to include pre-visit review of records, assessment, management and diagnostic testing,with shared decision making.  Labs/ tests ordered today include: None  Bettey Mare. Liborio Nixon, ANP, AACC   11/08/2019 5:53 PM     [facility] [address] Office [phone] Fax [phone]  Notice: This dictation was prepared with Dragon dictation along with smaller phrase technology. Any transcriptional errors that result from this process are unintentional and may not be corrected upon review.

## 2019-11-10 ENCOUNTER — Encounter: Payer: Self-pay | Admitting: Adult Health

## 2019-11-10 ENCOUNTER — Other Ambulatory Visit: Payer: Self-pay

## 2019-11-10 ENCOUNTER — Ambulatory Visit (INDEPENDENT_AMBULATORY_CARE_PROVIDER_SITE_OTHER): Payer: Commercial Managed Care - PPO | Admitting: Adult Health

## 2019-11-10 VITALS — BP 150/90 | HR 106 | Temp 98.4°F | Ht 71.0 in | Wt 246.6 lb

## 2019-11-10 DIAGNOSIS — I10 Essential (primary) hypertension: Secondary | ICD-10-CM | POA: Diagnosis not present

## 2019-11-10 DIAGNOSIS — I4819 Other persistent atrial fibrillation: Secondary | ICD-10-CM

## 2019-11-10 DIAGNOSIS — E119 Type 2 diabetes mellitus without complications: Secondary | ICD-10-CM

## 2019-11-10 DIAGNOSIS — Z794 Long term (current) use of insulin: Secondary | ICD-10-CM

## 2019-11-10 DIAGNOSIS — Z01810 Encounter for preprocedural cardiovascular examination: Secondary | ICD-10-CM

## 2019-11-10 DIAGNOSIS — G4733 Obstructive sleep apnea (adult) (pediatric): Secondary | ICD-10-CM

## 2019-11-10 DIAGNOSIS — Z9989 Dependence on other enabling machines and devices: Secondary | ICD-10-CM

## 2019-11-10 NOTE — Patient Instructions (Signed)
Medication Instructions:  HOLD- Eliquis starting on 17th of July restart soon after procedure unless told otherwise by the surgeon.  *If you need a refill on your cardiac medications before your next appointment, please call your pharmacy*   Lab Work: None Ordered   Testing/Procedures: None Ordered   Follow-Up: At [facility], you and your health needs are our priority.  As part of our continuing mission to provide you with exceptional heart care, we have created designated Provider Care Teams.  These Care Teams include your primary Cardiologist (physician) and Advanced Practice Providers (APPs -  Physician Assistants and Nurse Practitioners) who all work together to provide you with the care you need, when you need it.  We recommend signing up for the patient portal called "MyChart".  Sign up information is provided on this After Visit Summary.  MyChart is used to connect with patients for Virtual Visits (Telemedicine).  Patients are able to view lab/test results, encounter notes, upcoming appointments, etc.  Non-urgent messages can be sent to your provider as well.   To learn more about what you can do with MyChart, go to [email or web address].    Your next appointment:   6 month(s)  The format for your next appointment:   In Person  Provider:   You may see Armanda Magic, MD or one of the following Advanced Practice Providers on your designated Care Team:

## 2019-11-11 ENCOUNTER — Ambulatory Visit: Payer: Commercial Managed Care - PPO | Admitting: Nurse Practitioner

## 2019-11-11 ENCOUNTER — Encounter: Payer: Self-pay | Admitting: Nurse Practitioner

## 2019-11-11 VITALS — BP 146/88 | HR 79 | Temp 98.1°F | Ht 70.0 in | Wt 246.8 lb

## 2019-11-11 DIAGNOSIS — E1122 Type 2 diabetes mellitus with diabetic chronic kidney disease: Secondary | ICD-10-CM

## 2019-11-11 DIAGNOSIS — E1165 Type 2 diabetes mellitus with hyperglycemia: Secondary | ICD-10-CM

## 2019-11-11 DIAGNOSIS — B372 Candidiasis of skin and nail: Secondary | ICD-10-CM | POA: Diagnosis not present

## 2019-11-11 DIAGNOSIS — IMO0002 Reserved for concepts with insufficient information to code with codable children: Secondary | ICD-10-CM

## 2019-11-11 DIAGNOSIS — N182 Chronic kidney disease, stage 2 (mild): Secondary | ICD-10-CM | POA: Diagnosis not present

## 2019-11-11 DIAGNOSIS — B3749 Other urogenital candidiasis: Secondary | ICD-10-CM | POA: Diagnosis not present

## 2019-11-11 LAB — URINALYSIS, COMPLETE
Bilirubin, UA: NEGATIVE
Ketones, UA: NEGATIVE
Leukocytes,UA: NEGATIVE
Nitrite, UA: NEGATIVE
RBC, UA: NEGATIVE
Specific Gravity, UA: 1.025 (ref 1.005–1.030)
Urobilinogen, Ur: 1 mg/dL (ref 0.2–1.0)
pH, UA: 5.5 (ref 5.0–7.5)

## 2019-11-11 LAB — MICROSCOPIC EXAMINATION
Bacteria, UA: NONE SEEN
Casts: NONE SEEN /lpf
Epithelial Cells (non renal): NONE SEEN /hpf (ref 0–10)
RBC, Urine: NONE SEEN /hpf (ref 0–2)
WBC, UA: NONE SEEN /hpf (ref 0–5)

## 2019-11-11 MED ORDER — NYSTATIN 100000 UNIT/GM EX POWD: 1.0000 "application " | Freq: Three times a day (TID) | CUTANEOUS | 0 refills | Status: DC

## 2019-11-11 MED ORDER — FLUCONAZOLE 100 MG PO TABS: 100.0000 mg | ORAL_TABLET | Freq: Every day | ORAL | 0 refills | Status: DC

## 2019-11-11 MED ORDER — NYSTATIN 100000 UNIT/GM EX CREA: 1.0000 "application " | TOPICAL_CREAM | Freq: Two times a day (BID) | CUTANEOUS | 0 refills | Status: DC

## 2019-11-11 NOTE — Progress Notes (Signed)
This visit occurred during the SARS-CoV-2 public health emergency.  Safety protocols were in place, including screening questions prior to the visit, additional usage of staff PPE, and extensive cleaning of exam room while observing appropriate contact time as indicated for disinfecting solutions.  Subjective:     Patient ID: Colin [name] , male    DOB: January 27, 1956 , 64 y.o.   MRN: [id number]   Chief Complaint  Patient presents with  . groin area concerns    patient stated he may have a yeast infection in his groin area and he stated it is itchy at times     HPI  He is here today concerned he may have a yeast infection to his groin.   He is also needing surgical clearance.   His blood sugar on 11/05/2019 was 236.    Rash This is a new problem. The current episode started more than 1 month ago (2 months ago). The affected locations include the groin (penis is peeling). The rash is characterized by peeling and itchiness (moistness). Pertinent negatives include no cough. Past treatments include nothing.     Past Medical History:  Diagnosis Date  . Atrial flutter (HCC)   . DCM (dilated cardiomyopathy) (HCC)    EF 35-40% years ago but echo a year ago showed normal LVF  . Dilated aortic root (HCC) 04/15/2015   18mm by echo 06/2017 and Chest CT 12/2016  . Hypertension   . Obesities, morbid (HCC)   . OSA (obstructive sleep apnea)   . Persistent atrial fibrillation (HCC)    s/p TEE/DCCV now on Sotolol     Family History  Problem Relation Age of Onset  . Stroke Mother   . Heart Problems Father        weak heart  . Heart Problems Paternal Grandfather        pacemaker     Current Outpatient Medications:  .  acetaminophen (TYLENOL) 500 MG tablet, Take 1,000 mg by mouth every 6 (six) hours as needed for moderate pain or headache., Disp: , Rfl:  .  allopurinol (ZYLOPRIM) 100 MG tablet, Take 1 tablet (100 mg total) by mouth 2 (two) times daily. (Patient taking differently: Take 100 mg  by mouth 2 (two) times daily as needed (gout). ), Disp: 180 tablet, Rfl: 1 .  Ascorbic Acid (VITAMIN C PO), Take 1 tablet by mouth daily. , Disp: , Rfl:  .  atorvastatin (LIPITOR) 80 MG tablet, Take 1 tablet (80 mg total) by mouth daily., Disp: 90 tablet, Rfl: 1 .  B-D ULTRAFINE III SHORT PEN 31G X 8 MM MISC, USE AS DIRECTED WITH LEVEMIR PEN, Disp: 100 each, Rfl: 1 .  colchicine 0.6 MG tablet, Take 1 tablet (0.6 mg total) by mouth daily as needed. colcrys (Patient taking differently: Take 0.6 mg by mouth daily as needed (gout). ), Disp: 30 tablet, Rfl: 2 .  dapagliflozin propanediol (FARXIGA) 5 MG TABS tablet, Take 1 tablet by mouth daily (Patient taking differently: Take 5 mg by mouth daily. ), Disp: 30 tablet, Rfl: 3 .  ELIQUIS 5 MG TABS tablet, TAKE 1 TABLET BY MOUTH TWICE DAILY (Patient taking differently: Take 5 mg by mouth 2 (two) times daily. ), Disp: 60 tablet, Rfl: 6 .  levothyroxine (SYNTHROID, LEVOTHROID) 200 MCG tablet, Take 1 tablet by mouth Monday - Saturday and 1/2 tablet on Sunday (Patient taking differently: Take 100-200 mcg by mouth See admin instructions. Take 200 mcg Monday through Saturday and 100 mcg on Sundays), Disp: 90  tablet, Rfl: 1 .  metoprolol tartrate (LOPRESSOR) 50 MG tablet, Take 1 tablet (50 mg total) by mouth 2 (two) times daily., Disp: 180 tablet, Rfl: 2 .  OZEMPIC, 0.25 OR 0.5 MG/DOSE, 2 MG/1.5ML SOPN, INJECT 0.5 MG UNDER THE SKIN EVERY WEEK ON THE SAME DAY OF EACH WEEK, IN THE ABDOMINAL, THIGH, OR UPPER ARM ROTATING INJECTION SITES (Patient taking differently: Inject 0.5 mg as directed every Thursday. ), Disp: 1.5 mL, Rfl: 1 .  sildenafil (VIAGRA) 100 MG tablet, Take 1 tablet (100 mg total) by mouth daily as needed for erectile dysfunction., Disp: 10 tablet, Rfl: 1 .  TOUJEO SOLOSTAR 300 UNIT/ML Solostar Pen, INJECT 40 UNITS INTO THE SKIN AS PER INSULIN PROTOCOL (Patient taking differently: Inject 40 Units into the skin at bedtime. ), Disp: 4.5 mL, Rfl: 4   No Known  Allergies   Review of Systems  Constitutional: Negative.   Respiratory: Negative.  Negative for cough.   Cardiovascular: Negative.  Negative for chest pain, palpitations and leg swelling.  Skin: Positive for rash (groin and penis).  Neurological: Negative for dizziness and headaches.  Psychiatric/Behavioral: Negative.      Today's Vitals   11/11/19 0838  BP: (!) 146/88  Pulse: 79  Temp: 98.1 F (36.7 C)  TempSrc: Oral  Weight: 246 lb 12.8 oz (111.9 kg)  Height: 5\' 10"  (1.778 m)  PainSc: 8    Body mass index is 35.41 kg/m.   Objective:  Physical Exam Vitals reviewed.  Constitutional:      General: He is not in acute distress.    Appearance: Normal appearance. He is obese.  Cardiovascular:     Rate and Rhythm: Normal rate and regular rhythm.     Pulses: Normal pulses.     Heart sounds: Normal heart sounds. No murmur heard.   Skin:    Capillary Refill: Capillary refill takes less than 2 seconds.     Findings: Rash (right groin area with moist hypopigmented rash, peeling to tip of penis and has excoriation noted to crease upper buttocks. ) present.  Neurological:     General: No focal deficit present.     Mental Status: He is alert and oriented to person, place, and time.     Cranial Nerves: No cranial nerve deficit.  Psychiatric:        Mood and Affect: Mood normal.        Behavior: Behavior normal.        Thought Content: Thought content normal.        Judgment: Judgment normal.         Assessment And Plan:     1. Yeast dermatitis of penis  Will treat with nystatin powder  Advised to make sure his groin area is dry   Also discussed him taking Farxiga and yeast infections can be a side effect - nystatin (NYSTATIN) powder; Apply 1 application topically 3 (three) times daily.  Dispense: 15 g; Refill: 0 - nystatin cream (MYCOSTATIN); Apply 1 application topically 2 (two) times daily.  Dispense: 30 g; Refill: 0 - fluconazole (DIFLUCAN) 100 MG tablet; Take 1  tablet (100 mg total) by mouth daily. Take 1 tablet by mouth now repeat in 5 days  Dispense: 2 tablet; Refill: 0  2. Yeast dermatitis  Noted to upper buttocks in crease.  - nystatin (NYSTATIN) powder; Apply 1 application topically 3 (three) times daily.  Dispense: 15 g; Refill: 0 - nystatin cream (MYCOSTATIN); Apply 1 application topically 2 (two) times daily.  Dispense: 30  g; Refill: 0 - fluconazole (DIFLUCAN) 100 MG tablet; Take 1 tablet (100 mg total) by mouth daily. Take 1 tablet by mouth now repeat in 5 days  Dispense: 2 tablet; Refill: 0  3. Uncontrolled type 2 diabetes mellitus with stage 2 chronic kidney disease (HCC)  Discussed side effects of Farxiga  Advised to check his blood sugar more regularly.   He is also scheduled for surgery to his left knee later this month   Arnette Felts, FNP    THE PATIENT IS ENCOURAGED TO PRACTICE SOCIAL DISTANCING DUE TO THE COVID-19 PANDEMIC.

## 2019-11-11 NOTE — Patient Instructions (Signed)
Genital Yeast Infection, Male In men, a genital yeast infection is a condition that causes soreness, swelling, and redness (inflammation) of the head of the penis (glans penis). A genital yeast infection can be spread through sexual contact, but it can also develop without sexual contact. If the infection is not treated properly, it is likely to come back. What are the causes? This condition is caused by a change in the normal balance of the yeast and bacteria that live on the skin. This change causes an overgrowth of yeast, which causes the inflammation. Many types of yeast can cause this infection, but Candida is the most common. What increases the risk? The following factors may make you more likely to develop this condition:  Taking antibiotics.  Having diabetes.  Being exposed to the infection by a sexual partner.  Being uncircumcised.  Having a weak body defense system (immune system).  Taking steroid medicines for a long time.  Having poor hygiene. What are the signs or symptoms? Symptoms of this condition include:  Itching of the groin and penis.  Dry, red, or cracked skin on the penis.  Swelling of the genital area.  Pain while urinating or difficulty urinating.  Thick, bad-smelling discharge on the penis. How is this diagnosed? This condition may be diagnosed based on:  Your medical history.  A physical exam. You may also have tests, such as:  Test of a sample of discharge from the penis.  Urine tests.  Blood tests. How is this treated? This condition is treated with:  Anti-fungal creams or medicines. Anti-fungal medicines may be prescribed by your health care provider or they may be available over-the-counter.  Self-care at home. For men who are not circumcised, circumcision may be recommended to control infections that return and are difficult to treat. Follow these instructions at home: Medicines   Take or apply over-the-counter and prescription  medicines only as told by your health care provider.  Take your anti-fungal medicine as told by your health care provider. Do not stop taking the medicine even if you start to feel better. Self care  Wash your penis with soap and water every day. If you are not circumcised, pull back the foreskin to wash. Make sure to dry your penis completely after washing.  Wear breathable, cotton underwear.  Keep your underwear clean and dry. General instructions  Do not have sex until your health care provider has approved. Tell your sexual partner that you have a yeast infection. That person should go for treatment even if no symptoms are present.  If you have diabetes, keep your blood sugar levels within your target range.  Keep all follow-up visits as told by your health care provider. This is important. Contact a health care provider if you:  Have a fever.  Have symptoms that go away and then return.  Do not get better with treatment.  Have symptoms that get worse.  Have new symptoms. Get help right away if:  Your swelling and inflammation become so severe that you cannot urinate. Summary  In men, a genital yeast infection is a condition that causes soreness, swelling, and redness (inflammation) of the head of the penis (glans penis).  This condition is caused by a change in the normal balance of the yeast and bacteria that live on the skin. This change causes an overgrowth of yeast, which causes the inflammation.  A genital yeast infection usually spreads through sexual contact, but it can develop without sexual contact. For instance, you may   be more likely to develop this infection if you take antibiotics or steroids, have diabetes, are not circumcised, have a weak immune system, or have poor hygiene.  This condition is treated with anti-fungal cream or pills along with self-care at home. This information is not intended to replace advice given to you by your health care provider.  Make sure you discuss any questions you have with your health care provider. Document Revised: 05/28/2017 Document Reviewed: 05/28/2017 Elsevier Patient Education  2020 [employer]  

## 2019-11-11 NOTE — H&P (Signed)
UNICOMPARTMENTAL KNEE ADMISSION H&P  Patient is being admitted for left medial unicompartmental knee arthroplasty.  Subjective:  Chief Complaint:  Left knee medial compartmental primary OA /pain    HPI: Colin [name], 64 y.o. male , has a history of pain and functional disability in the left and has failed non-surgical conservative treatments for greater than 12 weeks to include NSAID's and/or analgesics, corticosteriod injections and activity modification.  Onset of symptoms was gradual, starting 09/03/2019 with an incident at work with gradually worsening course since that time. The patient noted no past surgery on the left knee.  Patient currently rates pain in the left knee at 10 out of 10 with activity. Patient has night pain, worsening of pain with activity and weight bearing, pain that interferes with activities of daily living, pain with passive range of motion, crepitus and joint swelling.  Patient has evidence of periarticular osteophytes and joint space narrowing of the medial compartment by imaging studies.  There is no active infection.  Risks, benefits and expectations were discussed with the patient.  Risks including but not limited to the risk of anesthesia, blood clots, nerve damage, blood vessel damage, failure of the prosthesis, infection and up to and including death.  Patient understand the risks, benefits and expectations and wishes to proceed with surgery.   PCP: Dorothyann Peng, MD  D/C Plans:       Home  Post-op Meds:       No Rx given   Tranexamic Acid:      To be given - IV   Decadron:      Is to be given  FYI:      Eliquis  Norco  DME:  Rx given for - RW & 3-n-1  PT:   OPPT Rx given  Pharmacy: [facility] - [address] [address]   Past Medical History:  Diagnosis Date  . Atrial flutter (HCC)   . DCM (dilated cardiomyopathy) (HCC)    EF 35-40% years ago but echo a year ago showed normal LVF  . Dilated aortic root (HCC) 04/15/2015   73mm by echo 06/2017 and  Chest CT 12/2016  . Hypertension   . Obesities, morbid (HCC)   . OSA (obstructive sleep apnea)   . Persistent atrial fibrillation [facility])    s/p TEE/DCCV now on Sotolol     Past Surgical History:  Procedure Laterality Date  . CARDIOVERSION N/A 09/24/2014   Procedure: CARDIOVERSION;  Surgeon: Quintella Reichert, MD;  Location: [facility] ENDOSCOPY;  Service: Cardiovascular;  Laterality: N/A;  . CARDIOVERSION N/A 08/02/2017   Procedure: CARDIOVERSION;  Surgeon: Pricilla Riffle, MD;  Location: [facility] ENDOSCOPY;  Service: Cardiovascular;  Laterality: N/A;  . TEE WITHOUT CARDIOVERSION N/A 09/24/2014   Procedure: TRANSESOPHAGEAL ECHOCARDIOGRAM (TEE);  Surgeon: Quintella Reichert, MD;  Location: [facility] ENDOSCOPY;  Service: Cardiovascular;  Laterality: N/A;    No Known Allergies  Social History   Tobacco Use  . Smoking status: Former Smoker    Packs/day: 0.50    Years: 15.00    Pack years: 7.50    Types: Cigarettes  . Smokeless tobacco: Never Used  Vaping Use  . Vaping Use: Never used  Substance Use Topics  . Alcohol use: No    Alcohol/week: 0.0 standard drinks  . Drug use: No    Family History  Problem Relation Age of Onset  . Stroke Mother   . Heart Problems Father        weak heart  . Heart Problems Paternal Grandfather  pacemaker     Review of Systems  Constitutional: Negative.   HENT: Negative.   Eyes: Negative.   Respiratory: Negative.   Cardiovascular: Negative.   Gastrointestinal: Negative.   Genitourinary: Negative.   Musculoskeletal: Positive for joint pain.  Skin: Negative.   Neurological: Negative.   Endo/Heme/Allergies: Negative.   Psychiatric/Behavioral: Negative.      Objective:   Physical Exam Constitutional:      Appearance: He is well-developed.  HENT:     Head: Normocephalic.  Eyes:     Pupils: Pupils are equal, round, and reactive to light.  Neck:     Thyroid: No thyromegaly.     Vascular: No JVD.     Trachea: No tracheal deviation.  Cardiovascular:      Rate and Rhythm: Normal rate and regular rhythm.  Pulmonary:     Effort: Pulmonary effort is normal. No respiratory distress.     Breath sounds: Normal breath sounds. No wheezing.  Abdominal:     Palpations: Abdomen is soft.     Tenderness: There is no abdominal tenderness. There is no guarding.  Musculoskeletal:     Cervical back: Neck supple.     Left knee: Swelling and bony tenderness present. No erythema or ecchymosis. Decreased range of motion. Tenderness present over the medial joint line. No lateral joint line tenderness.  Lymphadenopathy:     Cervical: No cervical adenopathy.  Skin:    General: Skin is warm and dry.  Neurological:     Mental Status: He is alert and oriented to person, place, and time.        Labs:  Estimated body mass index is 35.41 kg/m as calculated from the following:   Height as of 11/11/19: 5\' 10"  (1.778 m).   Weight as of 11/11/19: 111.9 kg.   Imaging Review Plain radiographs demonstrate severe degenerative joint disease of the left knee medial compartment.  The bone quality appears to be good for age and reported activity level.  Assessment/Plan:  End stage arthritis, left knee medial compartment  The patient history, physical examination, clinical judgment of the provider and imaging studies are consistent with end stage degenerative joint disease of the left knee and medial unicompartmental knee arthroplasty is deemed medically necessary. The treatment options including medical management, injection therapy arthroscopy and arthroplasty were discussed at length. The risks and benefits of total knee arthroplasty were presented and reviewed. The risks due to aseptic loosening, infection, stiffness, patella tracking problems, thromboembolic complications and other imponderables were discussed. The patient acknowledged the explanation, agreed to proceed with the plan and consent was signed. Patient is being admitted for outpatient / observation treatment  for surgery, pain control, PT, OT, prophylactic antibiotics, VTE prophylaxis, progressive ambulation and ADL's and discharge planning. The patient is planning to be discharged home.     01/12/20 Matson Welch   PA-C  11/11/2019, 12:25 PM

## 2019-11-17 ENCOUNTER — Telehealth: Payer: Self-pay

## 2019-11-17 NOTE — Patient Instructions (Addendum)
DUE TO COVID-19 ONLY ONE VISITOR ARE ALLOWED TO COME WITH YOU AND STAY IN THE WAITING ROOM ONLY DURING PRE OP AND  PROCEDURE. THEN TWO VISITORS MAY VISIT WITH YOU IN YOUR PRIVATE ROOM DURING VISITING HOURS ONLY!!   COVID SWAB TESTING MUST BE COMPLETED ON:     11-20-19 :10 AM [address] [place] [facility] enter pre surgical testing line (Must self quarantine after testing. Follow instructions on handout.)             Your procedure is scheduled on:  11-24-19   Report to [facility] Main  Entrance   Report to admitting at 11:50 AM   Call this number if you have problems the morning of surgery [phone]   Do not eat food :After Midnight.   May have liquids until 11:20 AM day of surgery   Complete one G2 drink the morning of surgery at 11:20 AM  the day of surgery.  Clear Liquid Diet  Foods Allowed                                                                     Foods Excluded  Water, Black Coffee and tea, regular and decaf             liquids that you cannot  Plain Jell-O in any flavor  (No red)                                    see through such as: Fruit ices (not with fruit pulp)                                     milk, soups, orange juice  Iced Popsicles (No red)                                     All solid food                                   Apple juices Sports drinks like Gatorade (No red) Lightly seasoned clear broth or consume(fat free) Sugar, honey syrup   Take these medicines the morning of surgery with A SIP OF WATER:  Levothyroxine (Synthroid), Metoprolol (Lopressor), Colchicine and Allpurinol (Zyloprim), prn    Oral Hygiene is also important to reduce your risk of infection.                                     Remember - BRUSH YOUR TEETH THE MORNING OF SURGERY WITH YOUR REGULAR TOOTHPASTE.  Afterwards no water, gum, candy or  mints.                              You may not have any metal on your body including   jewelry, and body piercings  Do not wear lotions, powders, cologne, or deodorant                           Men may shave face and neck.   Do not bring valuables to the hospital. [facility] IS NOT  RESPONSIBLE   FOR VALUABLES.   Contacts, dentures or bridgework may not be worn into surgery.   Bring small overnight bag day of surgery.               Please read over the following fact sheets you were given: IF YOU HAVE QUESTIONS ABOUT YOUR PRE OP INSTRUCTIONS PLEASE CALL  [phone]   How to Manage Your Diabetes Before and After Surgery  Why is it important to control my blood sugar before and after surgery? . Improving blood sugar levels before and after surgery helps healing and can limit problems. . A way of improving blood sugar control is eating a healthy diet by: o  Eating less sugar and carbohydrates o  Increasing activity/exercise o  Talking with your doctor about reaching your blood sugar goals . High blood sugars (greater than 180 mg/dL) can raise your risk of infections and slow your recovery, so you will need to focus on controlling your diabetes during the weeks before surgery. . Make sure that the doctor who takes care of your diabetes knows about your planned surgery including the date and location.  How do I manage my blood sugar before surgery? . Check your blood sugar at least 4 times a day, starting 2 days before surgery, to make sure that the level is not too high or low. o Check your blood sugar the morning of your surgery when you wake up and every 2 hours until you get to the Short Stay unit. . If your blood sugar is less than 70 mg/dL, you will need to treat for low blood sugar: o Do not take insulin. o Treat a low blood sugar (less than 70 mg/dL) with  cup of clear juice (cranberry or apple), 4 glucose tablets, OR glucose gel. o Recheck blood sugar in 15 minutes after treatment (to make sure it is greater than 70 mg/dL). If your blood sugar is  not greater than 70 mg/dL on recheck, call [phone] for further instructions. . Report your blood sugar to the short stay nurse when you get to Short Stay.  . If you are admitted to the hospital after surgery: o Your blood sugar will be checked by the staff and you will probably be given insulin after surgery (instead of oral diabetes medicines) to make sure you have good blood sugar levels. o The goal for blood sugar control after surgery is 80-180 mg/dL.   WHAT DO I DO ABOUT MY DIABETES MEDICATION?  Marland Kitchen. Do not take oral diabetes medicines (pills) the morning of surgery. .  . THE DAY BEFORE SURGERY DO NOT TAKE FARXIGA.  Marland Kitchen. THE NIGHT BEFORE SURGERY, take 20 units of Toujeo.      . The day of surgery, do not take other diabetes injectables.   [place] - Preparing for Surgery Before surgery, you can play an important role.  Because skin is not sterile, your skin needs to be as free of germs as possible.  You can reduce the number of germs on your skin by washing with CHG (chlorahexidine gluconate) soap before surgery.  CHG is an antiseptic cleaner which kills germs and bonds with the skin to continue  killing germs even after washing. Please DO NOT use if you have an allergy to CHG or antibacterial soaps.  If your skin becomes reddened/irritated stop using the CHG and inform your nurse when you arrive at Short Stay. Do not shave (including legs and underarms) for at least 48 hours prior to the first CHG shower.  You may shave your face/neck.  Please follow these instructions carefully:  1.  Shower with CHG Soap the night before surgery and the  morning of surgery.  2.  If you choose to wash your hair, wash your hair first as usual with your normal  shampoo.  3.  After you shampoo, rinse your hair and body thoroughly to remove the shampoo.                             4.  Use CHG as you would any other liquid soap.  You can apply chg directly to the skin and wash.  Gently with a scrungie  or clean washcloth.  5.  Apply the CHG Soap to your body ONLY FROM THE NECK DOWN.   Do   not use on face/ open                           Wound or open sores. Avoid contact with eyes, ears mouth and   genitals (private parts).                       Wash face,  Genitals (private parts) with your normal soap.             6.  Wash thoroughly, paying special attention to the area where your    surgery  will be performed.  7.  Thoroughly rinse your body with warm water from the neck down.  8.  DO NOT shower/wash with your normal soap after using and rinsing off the CHG Soap.                9.  Pat yourself dry with a clean towel.            10.  Wear clean pajamas.            11.  Place clean sheets on your bed the night of your first shower and do not  sleep with pets. Day of Surgery : Do not apply any lotions/deodorants the morning of surgery.  Please wear clean clothes to the hospital/surgery center.  FAILURE TO FOLLOW THESE INSTRUCTIONS MAY RESULT IN THE CANCELLATION OF YOUR SURGERY  PATIENT SIGNATURE_________________________________  NURSE SIGNATURE__________________________________  ________________________________________________________________________   Rogelia Mire  An incentive spirometer is a tool that can help keep your lungs clear and active. This tool measures how well you are filling your lungs with each breath. Taking long deep breaths may help reverse or decrease the chance of developing breathing (pulmonary) problems (especially infection) following:  A long period of time when you are unable to move or be active. BEFORE THE PROCEDURE   If the spirometer includes an indicator to show your best effort, your nurse or respiratory therapist will set it to a desired goal.  If possible, sit up straight or lean slightly forward. Try not to slouch.  Hold the incentive spirometer in an upright position. INSTRUCTIONS FOR USE  1. Sit on the edge of your bed if possible,  or sit up as far as  you can in bed or on a chair. 2. Hold the incentive spirometer in an upright position. 3. Breathe out normally. 4. Place the mouthpiece in your mouth and seal your lips tightly around it. 5. Breathe in slowly and as deeply as possible, raising the piston or the ball toward the top of the column. 6. Hold your breath for 3-5 seconds or for as long as possible. Allow the piston or ball to fall to the bottom of the column. 7. Remove the mouthpiece from your mouth and breathe out normally. 8. Rest for a few seconds and repeat Steps 1 through 7 at least 10 times every 1-2 hours when you are awake. Take your time and take a few normal breaths between deep breaths. 9. The spirometer may include an indicator to show your best effort. Use the indicator as a goal to work toward during each repetition. 10. After each set of 10 deep breaths, practice coughing to be sure your lungs are clear. If you have an incision (the cut made at the time of surgery), support your incision when coughing by placing a pillow or rolled up towels firmly against it. Once you are able to get out of bed, walk around indoors and cough well. You may stop using the incentive spirometer when instructed by your caregiver.  RISKS AND COMPLICATIONS  Take your time so you do not get dizzy or light-headed.  If you are in pain, you may need to take or ask for pain medication before doing incentive spirometry. It is harder to take a deep breath if you are having pain. AFTER USE  Rest and breathe slowly and easily.  It can be helpful to keep track of a log of your progress. Your caregiver can provide you with a simple table to help with this. If you are using the spirometer at home, follow these instructions: SEEK MEDICAL CARE IF:   You are having difficultly using the spirometer.  You have trouble using the spirometer as often as instructed.  Your pain medication is not giving enough relief while using the  spirometer.  You develop fever of 100.5 F (38.1 C) or higher. SEEK IMMEDIATE MEDICAL CARE IF:   You cough up bloody sputum that had not been present before.  You develop fever of 102 F (38.9 C) or greater.  You develop worsening pain at or near the incision site. MAKE SURE YOU:   Understand these instructions.  Will watch your condition.  Will get help right away if you are not doing well or get worse. Document Released: 09/03/2006 Document Revised: 07/16/2011 Document Reviewed: 11/04/2006 [employer] Patient Information 2014 [employer], [place].   ________________________________________________________________________  WHAT IS A BLOOD TRANSFUSION? Blood Transfusion Information  A transfusion is the replacement of blood or some of its parts. Blood is made up of multiple cells which provide different functions.  Red blood cells carry oxygen and are used for blood loss replacement.  White blood cells fight against infection.  Platelets control bleeding.  Plasma helps clot blood.  Other blood products are available for specialized needs, such as hemophilia or other clotting disorders. BEFORE THE TRANSFUSION  Who gives blood for transfusions?   Healthy volunteers who are fully evaluated to make sure their blood is safe. This is blood bank blood. Transfusion therapy is the safest it has ever been in the practice of medicine. Before blood is taken from a donor, a complete history is taken to make sure that person has no history of diseases nor  engages in risky social behavior (examples are intravenous drug use or sexual activity with multiple partners). The donor's travel history is screened to minimize risk of transmitting infections, such as malaria. The donated blood is tested for signs of infectious diseases, such as HIV and hepatitis. The blood is then tested to be sure it is compatible with you in order to minimize the chance of a transfusion reaction. If you or a relative  donates blood, this is often done in anticipation of surgery and is not appropriate for emergency situations. It takes many days to process the donated blood. RISKS AND COMPLICATIONS Although transfusion therapy is very safe and saves many lives, the main dangers of transfusion include:   Getting an infectious disease.  Developing a transfusion reaction. This is an allergic reaction to something in the blood you were given. Every precaution is taken to prevent this. The decision to have a blood transfusion has been considered carefully by your caregiver before blood is given. Blood is not given unless the benefits outweigh the risks. AFTER THE TRANSFUSION  Right after receiving a blood transfusion, you will usually feel much better and more energetic. This is especially true if your red blood cells have gotten low (anemic). The transfusion raises the level of the red blood cells which carry oxygen, and this usually causes an energy increase.  The nurse administering the transfusion will monitor you carefully for complications. HOME CARE INSTRUCTIONS  No special instructions are needed after a transfusion. You may find your energy is better. Speak with your caregiver about any limitations on activity for underlying diseases you may have. SEEK MEDICAL CARE IF:   Your condition is not improving after your transfusion.  You develop redness or irritation at the intravenous (IV) site. SEEK IMMEDIATE MEDICAL CARE IF:  Any of the following symptoms occur over the next 12 hours:  Shaking chills.  You have a temperature by mouth above 102 F (38.9 C), not controlled by medicine.  Chest, back, or muscle pain.  People around you feel you are not acting correctly or are confused.  Shortness of breath or difficulty breathing.  Dizziness and fainting.  You get a rash or develop hives.  You have a decrease in urine output.  Your urine turns a dark color or changes to pink, red, or brown. Any  of the following symptoms occur over the next 10 days:  You have a temperature by mouth above 102 F (38.9 C), not controlled by medicine.  Shortness of breath.  Weakness after normal activity.  The white part of the eye turns yellow (jaundice).  You have a decrease in the amount of urine or are urinating less often.  Your urine turns a dark color or changes to pink, red, or brown. Document Released: 04/20/2000 Document Revised: 07/16/2011 Document Reviewed: 12/08/2007 [facility] Patient Information 2014 [place], [place].  _______________________________________________________________________

## 2019-11-17 NOTE — Progress Notes (Addendum)
COVID Vaccine Completed:  x2 Date COVID Vaccine completed: COVID vaccine manufacturer: Pfizer    [employer]   PCP - Dr. Dorothyann Peng Cardiologist - Dr. Armanda Magic.  Last OV 11-10-19  Cardiac Clearance - per note 11-10-19 by Joni Reining, NP  No Back Stimulator  Chest x-ray -  EKG - 11-10-19 Stress Test -  ECHO - 05-28-19 in Epic Cardiac Cath -   Sleep Study -  CPAP - Yes  Fasting Blood Sugar -  Checks Blood Sugar _____ times a day  Blood Thinner Instructions: Eliquis 5 mg.  Hold 3 days prior to surgery per note 11-10-19.  Last dose 11-19-19  Aspirin Instructions: Last Dose:  Anesthesia review:   Patient denies shortness of breath, fever, cough and chest pain at PAT appointment  No SOB with ADL's  Patient verbalized understanding of instructions that were given to them at the PAT appointment. Patient was also instructed that they will need to review over the PAT instructions again at home before surgery.

## 2019-11-17 NOTE — Telephone Encounter (Signed)
Pa sent to plan for ozempic

## 2019-11-18 NOTE — Telephone Encounter (Signed)
Pt was seen by Joni Reining, BP on 11/10/19 with follow recs: "1. Pre-Operative Evaluation:   Chart reviewed as part of pre-operative protocol coverage. Given past medical history and time since last visit, based on ACC/AHA guidelines, Suvan E Kerth would be at acceptable risk for the planned procedure without further cardiovascular testing.  He should hold Eliquis 3 days prior to his procedure.  Based upon notes, he will hold on 11/21/2019, and resume as soon as possible after procedure which is scheduled for 11/24/2019.  This is been explained to him he verbalizes understanding."

## 2019-11-20 ENCOUNTER — Other Ambulatory Visit (HOSPITAL_COMMUNITY): Payer: Self-pay | Admitting: Nurse Practitioner

## 2019-11-20 ENCOUNTER — Encounter (HOSPITAL_COMMUNITY): Payer: Self-pay

## 2019-11-20 ENCOUNTER — Encounter (HOSPITAL_COMMUNITY)
Admission: RE | Admit: 2019-11-20 | Discharge: 2019-11-20 | Disposition: A | Discharge status: Home / Self Care | Payer: No Typology Code available for payment source | Source: Ambulatory Visit | Attending: Orthopedic Surgery | Admitting: Orthopedic Surgery

## 2019-11-20 ENCOUNTER — Other Ambulatory Visit: Payer: Self-pay

## 2019-11-20 ENCOUNTER — Other Ambulatory Visit (HOSPITAL_COMMUNITY)
Admission: RE | Admit: 2019-11-20 | Discharge: 2019-11-20 | Disposition: A | Discharge status: Home / Self Care | Payer: Commercial Managed Care - PPO | Source: Ambulatory Visit | Attending: Orthopedic Surgery | Admitting: Orthopedic Surgery

## 2019-11-20 DIAGNOSIS — Z20822 Contact with and (suspected) exposure to covid-19: Secondary | ICD-10-CM | POA: Insufficient documentation

## 2019-11-20 DIAGNOSIS — Z01812 Encounter for preprocedural laboratory examination: Secondary | ICD-10-CM | POA: Diagnosis not present

## 2019-11-20 LAB — BASIC METABOLIC PANEL
Anion gap: 11 (ref 5–15)
BUN: 12 mg/dL (ref 8–23)
CO2: 25 mmol/L (ref 22–32)
Calcium: 9.7 mg/dL (ref 8.9–10.3)
Chloride: 101 mmol/L (ref 98–111)
Creatinine, Ser: 0.92 mg/dL (ref 0.61–1.24)
GFR calc Af Amer: >60 mL/min (ref >60)
GFR calc non Af Amer: >60 mL/min (ref >60)
Glucose, Bld: 228 mg/dL — ABNORMAL HIGH (ref 70–99)
Potassium: 4.6 mmol/L (ref 3.5–5.1)
Sodium: 137 mmol/L (ref 135–145)

## 2019-11-20 LAB — CBC
HCT: 45.8 % (ref 39.0–52.0)
Hemoglobin: 15.4 g/dL (ref 13.0–17.0)
MCH: 27.8 pg (ref 26.0–34.0)
MCHC: 33.6 g/dL (ref 30.0–36.0)
MCV: 82.7 fL (ref 80.0–100.0)
Platelets: 201 10*3/uL (ref 150–400)
RBC: 5.54 MIL/uL (ref 4.22–5.81)
RDW: 14 % (ref 11.5–15.5)
WBC: 10.5 10*3/uL (ref 4.0–10.5)
nRBC: 0 % (ref 0.0–0.2)

## 2019-11-20 LAB — SARS CORONAVIRUS 2 (TAT 6-24 HRS): SARS Coronavirus 2: NEGATIVE

## 2019-11-20 LAB — HEMOGLOBIN A1C
Hgb A1c MFr Bld: 8.2 % — ABNORMAL HIGH (ref 4.8–5.6)
Mean Plasma Glucose: 188.64 mg/dL

## 2019-11-20 LAB — GLUCOSE, CAPILLARY: Glucose-Capillary: 215 mg/dL — ABNORMAL HIGH (ref 70–99)

## 2019-11-20 LAB — SURGICAL PCR SCREEN
MRSA, PCR: NEGATIVE
Staphylococcus aureus: NEGATIVE

## 2019-11-20 NOTE — Progress Notes (Signed)
HGg A1c sent to Dr. Charlann Boxer to review.

## 2019-11-24 ENCOUNTER — Encounter (HOSPITAL_COMMUNITY): Admission: RE | Payer: Self-pay | Source: Home / Self Care

## 2019-11-24 ENCOUNTER — Ambulatory Visit (HOSPITAL_COMMUNITY)
Admission: RE | Admit: 2019-11-24 | Payer: No Typology Code available for payment source | Source: Home / Self Care | Admitting: Orthopedic Surgery

## 2019-11-24 ENCOUNTER — Ambulatory Visit: Payer: Self-pay | Admitting: Internal Medicine

## 2019-11-24 LAB — TYPE AND SCREEN
ABO/RH(D): O POS
Antibody Screen: NEGATIVE

## 2019-11-24 SURGERY — ARTHROPLASTY, KNEE, UNICOMPARTMENTAL
Anesthesia: Spinal | Site: Knee | Laterality: Left

## 2019-11-29 ENCOUNTER — Encounter: Payer: Self-pay | Admitting: Internal Medicine

## 2019-11-29 NOTE — Progress Notes (Signed)
This visit occurred during the SARS-CoV-2 public health emergency.  Safety protocols were in place, including screening questions prior to the visit, additional usage of staff PPE, and extensive cleaning of exam room while observing appropriate contact time as indicated for disinfecting solutions.  Subjective:     Patient ID: Colin [name] , male    DOB: 08-09-1955 , 64 y.o.   MRN: [id number]   Chief Complaint  Patient presents with  . Medical Clearance    HPI  He presents for preoperative evaluation/medical clearance. He is scheduled for left medial unicompartmental knee arthroplasty on 11/24/2019.  The patient has a history hypertension w/ CKD, CHF, and diabetes.    On 11/03/2019 pharmacy advised the patient to hold Eliquis 3 days before procedure. He is also scheduled for Cardiology evaluation prior to procedure.       Past Medical History:  Diagnosis Date  . Atrial flutter ([place])   . DCM (dilated cardiomyopathy) ([place])    EF 35-40% years ago but echo a year ago showed normal LVF  . Dilated aortic root ([place]) 04/15/2015   4m by echo 06/2017 and Chest CT 12/2016  . Hypertension   . Ob[place]ities, morbid [place]   . OSA (obstructive sleep apnea)   . Persistent atrial fibrillation (HCC)    s/p TEE/DCCV now on Sotolol     Family History  Problem Relation Age of Onset  . Stroke Mother   . Heart Problems Father        weak heart  . Heart Problems Paternal Grandfather        pacemaker     Current Outpatient Medications:  .  allopurinol (ZYLOPRIM) 100 MG tablet, Take 1 tablet (100 mg total) by mouth 2 (two) times daily. (Patient taking differently: Take 100 mg by mouth 2 (two) times daily as needed (gout). ), Disp: 180 tablet, Rfl: 1 .  Ascorbic Acid (VITAMIN C PO), Take 1 tablet by mouth daily. , Disp: , Rfl:  .  atorvastatin (LIPITOR) 80 MG tablet, Take 1 tablet (80 mg total) by mouth daily., Disp: 90 tablet, Rfl: 1 .  B-D ULTRAFINE III SHORT PEN 31G X 8 MM MISC, USE AS DIRECTED  WITH LEVEMIR PEN, Disp: 100 each, Rfl: 1 .  colchicine 0.6 MG tablet, Take 1 tablet (0.6 mg total) by mouth daily as needed. colcrys (Patient taking differently: Take 0.6 mg by mouth daily as needed (gout). ), Disp: 30 tablet, Rfl: 2 .  dapagliflozin propanediol (FARXIGA) 5 MG TABS tablet, Take 1 tablet by mouth daily (Patient taking differently: Take 5 mg by mouth daily. ), Disp: 30 tablet, Rfl: 3 .  ELIQUIS 5 MG TABS tablet, TAKE 1 TABLET BY MOUTH TWICE DAILY (Patient taking differently: Take 5 mg by mouth 2 (two) times daily. ), Disp: 60 tablet, Rfl: 6 .  levothyroxine (SYNTHROID, LEVOTHROID) 200 MCG tablet, Take 1 tablet by mouth Monday - Saturday and 1/2 tablet on Sunday (Patient taking differently: Take 100-200 mcg by mouth See admin instructions. Take 200 mcg Monday through Saturday and 100 mcg on Sundays), Disp: 90 tablet, Rfl: 1 .  OZEMPIC, 0.25 OR 0.5 MG/DOSE, 2 MG/1.5ML SOPN, INJECT 0.5 MG UNDER THE SKIN EVERY WEEK ON THE SAME DAY OF EACH WEEK, IN THE ABDOMINAL, THIGH, OR UPPER ARM ROTATING INJECTION SITES (Patient taking differently: Inject 0.5 mg as directed every Thursday. ), Disp: 1.5 mL, Rfl: 1 .  sildenafil (VIAGRA) 100 MG tablet, Take 1 tablet (100 mg total) by mouth daily as needed for erectile  dysfunction., Disp: 10 tablet, Rfl: 1 .  TOUJEO SOLOSTAR 300 UNIT/ML Solostar Pen, INJECT 40 UNITS INTO THE SKIN AS PER INSULIN PROTOCOL (Patient taking differently: Inject 40 Units into the skin at bedtime. ), Disp: 4.5 mL, Rfl: 4 .  acetaminophen (TYLENOL) 500 MG tablet, Take 1,000 mg by mouth every 6 (six) hours as needed for moderate pain or headache., Disp: , Rfl:  .  fluconazole (DIFLUCAN) 100 MG tablet, Take 1 tablet (100 mg total) by mouth daily. Take 1 tablet by mouth now repeat in 5 days (Patient not taking: Reported on 11/20/2019), Disp: 2 tablet, Rfl: 0 .  metoprolol tartrate (LOPRESSOR) 50 MG tablet, Take 1 tablet (50 mg total) by mouth 2 (two) times daily. Appointment Required For  Further Refills [phone], Disp: 60 tablet, Rfl: 0 .  nystatin (NYSTATIN) powder, Apply 1 application topically 3 (three) times daily., Disp: 15 g, Rfl: 0 .  nystatin cream (MYCOSTATIN), Apply 1 application topically 2 (two) times daily., Disp: 30 g, Rfl: 0   No Known Allergies   Review of Systems  Constitutional: Negative.   Respiratory: Negative.   Cardiovascular: Negative.   Gastrointestinal: Negative.   Neurological: Negative.   Psychiatric/Behavioral: Negative.      Today's Vitals   11/05/19 1428  BP: 138/72  Pulse: 71  Temp: 98 F (36.7 C)  TempSrc: Oral  Weight: 246 lb 12.8 oz (111.9 kg)  Height: '5\' 10"'  (1.778 m)  PainSc: 4    Body mass index is 35.41 kg/m.   Objective:  Physical Exam Vitals and nursing note reviewed.  Constitutional:      Appearance: Normal appearance. He is obese.  Cardiovascular:     Rate and Rhythm: Normal rate. Rhythm irregular.     Heart sounds: Normal heart sounds.  Pulmonary:     Effort: Pulmonary effort is normal.     Breath sounds: Normal breath sounds.  Musculoskeletal:     Comments: Ambulatory with limp  Skin:    General: Skin is warm.  Neurological:     General: No focal deficit present.     Mental Status: He is alert.  Psychiatric:        Mood and Affect: Mood normal.         Assessment And Plan:     1. Pre-op evaluation Comments: I will check labs as per Ortho request. He may proceed with surgery w/ acceptable risk. Importance of optimal DM control to help with healing process was discussed in detail with the patient.   - CBC no Diff - BMP8+EGFR - Albumin - Hemoglobin A1c - Urinalysis, Complete (81001)  2. Typical atrial flutter (HCC) Comments: Chronic, rate controlled. He is encouraged to take meds as prescribed. Advised to stop Eliquis 3 days prior to procedure.   3. Hypertensive heart and renal disease with renal failure, stage 1 through stage 4 or unspecified chronic kidney disease, without heart  failure Comments: Chronic, fair control. He will continue with current meds.      Patient was given opportunity to ask questions. Patient verbalized understanding of the plan and was able to repeat key elements of the plan. All questions were answered to their satisfaction.  Maximino Greenland, MD   I, Maximino Greenland, MD, have reviewed all documentation for this visit. The documentation on 11/29/19 for the exam, diagnosis, procedures, and orders are all accurate and complete.  THE PATIENT IS ENCOURAGED TO PRACTICE SOCIAL DISTANCING DUE TO THE COVID-19 PANDEMIC.

## 2019-12-10 ENCOUNTER — Ambulatory Visit (INDEPENDENT_AMBULATORY_CARE_PROVIDER_SITE_OTHER): Payer: Commercial Managed Care - PPO | Admitting: Internal Medicine

## 2019-12-10 ENCOUNTER — Encounter: Payer: Self-pay | Admitting: Internal Medicine

## 2019-12-10 ENCOUNTER — Other Ambulatory Visit: Payer: Self-pay

## 2019-12-10 VITALS — BP 138/86 | HR 89 | Temp 97.9°F | Ht 71.0 in | Wt 245.6 lb

## 2019-12-10 DIAGNOSIS — I1310 Hypertensive heart and chronic kidney disease without heart failure, with stage 1 through stage 4 chronic kidney disease, or unspecified chronic kidney disease: Secondary | ICD-10-CM

## 2019-12-10 DIAGNOSIS — Z0000 Encounter for general adult medical examination without abnormal findings: Secondary | ICD-10-CM | POA: Diagnosis not present

## 2019-12-10 DIAGNOSIS — E1165 Type 2 diabetes mellitus with hyperglycemia: Secondary | ICD-10-CM

## 2019-12-10 DIAGNOSIS — E6609 Other obesity due to excess calories: Secondary | ICD-10-CM

## 2019-12-10 DIAGNOSIS — E1122 Type 2 diabetes mellitus with diabetic chronic kidney disease: Secondary | ICD-10-CM | POA: Diagnosis not present

## 2019-12-10 DIAGNOSIS — IMO0002 Reserved for concepts with insufficient information to code with codable children: Secondary | ICD-10-CM

## 2019-12-10 DIAGNOSIS — I4819 Other persistent atrial fibrillation: Secondary | ICD-10-CM

## 2019-12-10 DIAGNOSIS — Z6834 Body mass index (BMI) 34.0-34.9, adult: Secondary | ICD-10-CM

## 2019-12-10 DIAGNOSIS — N182 Chronic kidney disease, stage 2 (mild): Secondary | ICD-10-CM | POA: Diagnosis not present

## 2019-12-10 LAB — POCT UA - MICROALBUMIN
Albumin/Creatinine Ratio, Urine, POC: <30
Creatinine, POC: 100 mg/dL
Microalbumin Ur, POC: 30 mg/L

## 2019-12-10 NOTE — Progress Notes (Signed)
I,Katawbba Wiggins,acting as a Neurosurgeon for Gwynneth Aliment, MD.,have documented all relevant documentation on the behalf of Gwynneth Aliment, MD,as directed by  Gwynneth Aliment, MD while in the presence of Gwynneth Aliment, MD.  This visit occurred during the SARS-CoV-2 public health emergency.  Safety protocols were in place, including screening questions prior to the visit, additional usage of staff PPE, and extensive cleaning of exam room while observing appropriate contact time as indicated for disinfecting solutions.  Subjective:     Patient ID: Colin [name] , male    DOB: 1955-12-14 , 64 y.o.   MRN: [id number]   Chief Complaint  Patient presents with   Annual Exam   Diabetes   Hypertension    HPI  He is here today for a full physical examination. He's concerned  about getting his a1c under 7.5 so that he can have his partial left knee surgery.   Diabetes He presents for his follow-up diabetic visit. He has type 2 diabetes mellitus. His disease course has been improving. There are no hypoglycemic associated symptoms. Pertinent negatives for diabetes include no blurred vision and no chest pain. There are no hypoglycemic complications. Diabetic complications include nephropathy. Risk factors for coronary artery disease include diabetes mellitus, dyslipidemia, hypertension, sedentary lifestyle, male sex and obesity. He is following a generally unhealthy diet. When asked about meal planning, he reported none. He participates in exercise intermittently. His breakfast blood glucose is taken between 8-9 am. His breakfast blood glucose range is generally 140-180 mg/dl. An ACE inhibitor/angiotensin II receptor blocker is being taken. Eye exam is current.  Hypertension This is a chronic problem. The current episode started more than 1 year ago. The problem has been gradually improving since onset. The problem is controlled. Pertinent negatives include no blurred vision, chest pain, palpitations or  shortness of breath. Risk factors for coronary artery disease include diabetes mellitus, dyslipidemia, obesity, male gender and sedentary lifestyle. The current treatment provides moderate improvement. Compliance problems include exercise.      Past Medical History:  Diagnosis Date   Atrial flutter (HCC)    DCM (dilated cardiomyopathy) (HCC)    EF 35-40% years ago but echo a year ago showed normal LVF   Dilated aortic root (HCC) 04/15/2015   40mm by echo 06/2017 and Chest CT 12/2016   Hypertension    Obesities, morbid (HCC)    OSA (obstructive sleep apnea)    Persistent atrial fibrillation (HCC)    s/p TEE/DCCV now on Sotolol     Family History  Problem Relation Age of Onset   Stroke Mother    Heart Problems Father        weak heart   Heart Problems Paternal Grandfather        pacemaker     Current Outpatient Medications:    acetaminophen (TYLENOL) 500 MG tablet, Take 1,000 mg by mouth every 6 (six) hours as needed for moderate pain or headache., Disp: , Rfl:    allopurinol (ZYLOPRIM) 100 MG tablet, Take 1 tablet (100 mg total) by mouth 2 (two) times daily. (Patient taking differently: Take 100 mg by mouth 2 (two) times daily as needed (gout). ), Disp: 180 tablet, Rfl: 1   atorvastatin (LIPITOR) 80 MG tablet, Take 1 tablet (80 mg total) by mouth daily., Disp: 90 tablet, Rfl: 1   B-D ULTRAFINE III SHORT PEN 31G X 8 MM MISC, USE AS DIRECTED WITH LEVEMIR PEN, Disp: 100 each, Rfl: 1   colchicine 0.6 MG tablet, Take  1 tablet (0.6 mg total) by mouth daily as needed. colcrys (Patient taking differently: Take 0.6 mg by mouth daily as needed (gout). ), Disp: 30 tablet, Rfl: 2   dapagliflozin propanediol (FARXIGA) 5 MG TABS tablet, Take 1 tablet by mouth daily (Patient taking differently: Take 5 mg by mouth daily. ), Disp: 30 tablet, Rfl: 3   ELIQUIS 5 MG TABS tablet, TAKE 1 TABLET BY MOUTH TWICE DAILY (Patient taking differently: Take 5 mg by mouth 2 (two) times daily. ),  Disp: 60 tablet, Rfl: 6   levothyroxine (SYNTHROID, LEVOTHROID) 200 MCG tablet, Take 1 tablet by mouth Monday - Saturday and 1/2 tablet on Sunday (Patient taking differently: Take 100-200 mcg by mouth See admin instructions. Take 200 mcg Monday through Saturday and 100 mcg on Sundays), Disp: 90 tablet, Rfl: 1   metoprolol tartrate (LOPRESSOR) 50 MG tablet, Take 1 tablet (50 mg total) by mouth 2 (two) times daily. Appointment Required For Further Refills [phone], Disp: 60 tablet, Rfl: 0   OZEMPIC, 0.25 OR 0.5 MG/DOSE, 2 MG/1.5ML SOPN, INJECT 0.5 MG UNDER THE SKIN EVERY WEEK ON THE SAME DAY OF EACH WEEK, IN THE ABDOMINAL, THIGH, OR UPPER ARM ROTATING INJECTION SITES (Patient taking differently: Inject 0.5 mg as directed every Thursday. ), Disp: 1.5 mL, Rfl: 1   sildenafil (VIAGRA) 100 MG tablet, Take 1 tablet (100 mg total) by mouth daily as needed for erectile dysfunction., Disp: 10 tablet, Rfl: 1   TOUJEO SOLOSTAR 300 UNIT/ML Solostar Pen, INJECT 40 UNITS INTO THE SKIN AS PER INSULIN PROTOCOL (Patient taking differently: Inject 40 Units into the skin at bedtime. ), Disp: 4.5 mL, Rfl: 4   nystatin (NYSTATIN) powder, Apply 1 application topically 3 (three) times daily. (Patient not taking: Reported on 12/09/2019), Disp: 15 g, Rfl: 0   nystatin cream (MYCOSTATIN), Apply 1 application topically 2 (two) times daily. (Patient not taking: Reported on 12/09/2019), Disp: 30 g, Rfl: 0   No Known Allergies   Men's preventive visit. Patient Health Questionnaire (PHQ-2) is    Office Visit from 12/10/2019 in [facility]  PHQ-2 Total Score 0    . Patient is on a diabetic diet. Marital status: Married. Relevant history for alcohol use is:  Social History   Substance and Sexual Activity  Alcohol Use No   Alcohol/week: 0.0 standard drinks  . Relevant history for tobacco use is:  Social History   Tobacco Use  Smoking Status Former Smoker   Packs/day: 0.50   Years: 15.00    Pack years: 7.50   Types: Cigarettes  Smokeless Tobacco Never Used  .   Review of Systems  Constitutional: Negative.   HENT: Negative.   Eyes: Negative.  Negative for blurred vision.  Respiratory: Negative.  Negative for shortness of breath.   Cardiovascular: Negative.  Negative for chest pain and palpitations.  Gastrointestinal: Negative.   Endocrine: Negative.   Genitourinary: Negative.   Musculoskeletal: Negative.   Skin: Negative.   Allergic/Immunologic: Negative.   Neurological: Negative.   Hematological: Negative.   Psychiatric/Behavioral: Negative.      Today's Vitals   12/10/19 1434  BP: 138/86  Pulse: 89  Temp: 97.9 F (36.6 C)  TempSrc: Oral  Weight: 245 lb 9.6 oz (111.4 kg)  Height: 5\' 11"  (1.803 m)   Body mass index is 34.25 kg/m.  Wt Readings from Last 3 Encounters:  12/10/19 245 lb 9.6 oz (111.4 kg)  11/20/19 244 lb (110.7 kg)  11/11/19 246 lb 12.8 oz (111.9 kg)  Objective:  Physical Exam Vitals and nursing note reviewed.  Constitutional:      Appearance: Normal appearance. He is obese.  HENT:     Head: Normocephalic and atraumatic.     Right Ear: Tympanic membrane, ear canal and external ear normal.     Left Ear: Tympanic membrane, ear canal and external ear normal.     Nose:     Comments: Deferred, masked    Mouth/Throat:     Comments: Deferred, masked Eyes:     Extraocular Movements: Extraocular movements intact.     Conjunctiva/sclera: Conjunctivae normal.     Pupils: Pupils are equal, round, and reactive to light.  Cardiovascular:     Rate and Rhythm: Normal rate. Rhythm irregular.     Pulses:          Dorsalis pedis pulses are 1+ on the right side and 1+ on the left side.     Heart sounds: Normal heart sounds.  Pulmonary:     Effort: Pulmonary effort is normal.     Breath sounds: Normal breath sounds.  Chest:     Breasts:        Right: Normal. No swelling, bleeding, inverted nipple, mass or nipple discharge.        Left: Normal.  No swelling, bleeding, inverted nipple, mass or nipple discharge.  Abdominal:     General: Bowel sounds are normal.     Palpations: Abdomen is soft.     Comments: Rounded, soft  Genitourinary:    Penis: Normal.      Testes: Normal.     Prostate: Normal.     Rectum: Normal. Guaiac result negative.  Musculoskeletal:        General: Normal range of motion.     Cervical back: Normal range of motion and neck supple.  Feet:     Right foot:     Protective Sensation: 5 sites tested. 5 sites sensed.     Skin integrity: Callus and dry skin present.     Toenail Condition: Right toenails are abnormally thick.     Left foot:     Protective Sensation: 5 sites sensed.     Skin integrity: Callus and dry skin present.     Toenail Condition: Left toenails are abnormally thick.  Skin:    General: Skin is warm.  Neurological:     General: No focal deficit present.     Mental Status: He is alert and oriented to person, place, and time.  Psychiatric:        Mood and Affect: Mood normal.        Behavior: Behavior normal.         Assessment And Plan:    1. Routine general medical examination at a health care facility Comments: A full exam was performed. DRE/testicular exam performed. Stool heme negative.  PATIENT IS ADVISED TO GET 30-45 MINUTES REGULAR EXERCISE NO LESS THAN FOUR TO FIVE DAYS PER WEEK - BOTH WEIGHTBEARING EXERCISES AND AEROBIC ARE RECOMMENDED.  PATIENT IS ADVISED TO FOLLOW A HEALTHY DIET WITH AT LEAST SIX FRUITS/VEGGIES PER DAY, DECREASE INTAKE OF RED MEAT, AND TO INCREASE FISH INTAKE TO TWO DAYS PER WEEK.  MEATS/FISH SHOULD NOT BE FRIED, BAKED OR BROILED IS PREFERABLE.  I SUGGEST WEARING SPF 50 SUNSCREEN ON EXPOSED PARTS AND ESPECIALLY WHEN IN THE DIRECT SUNLIGHT FOR AN EXTENDED PERIOD OF TIME.  PLEASE AVOID FAST FOOD RESTAURANTS AND INCREASE YOUR WATER INTAKE.  - VITAMIN D 25 Hydroxy (Vit-D Deficiency, Fractures) - PSA -  Lipid panel - TSH - T4, Free  2. Uncontrolled type 2  diabetes mellitus with stage 2 chronic kidney disease (HCC) Comments: Diabetic foot exam was performed. I DISCUSSED WITH THE PATIENT AT LENGTH REGARDING THE GOALS OF GLYCEMIC CONTROL AND POSSIBLE LONG-TERM COMPLICATIONS.  I  ALSO STRESSED THE IMPORTANCE OF COMPLIANCE WITH HOME GLUCOSE MONITORING, DIETARY RESTRICTIONS INCLUDING AVOIDANCE OF SUGARY DRINKS/PROCESSED FOODS,  ALONG WITH REGULAR EXERCISE.  I  ALSO STRESSED THE IMPORTANCE OF ANNUAL EYE EXAMS, SELF FOOT CARE AND COMPLIANCE WITH OFFICE VISITS.  - POCT UA - Microalbumin  3. Hypertensive heart and renal disease with renal failure, stage 1 through stage 4 or unspecified chronic kidney disease, without heart failure  Chronic, fair control. He will continue with current meds. HE is encouraged to limit his salt intake and to slowly increase exercise once he is cleared from Ortho.   4. Persistent atrial fibrillation (HCC)  Chronic, EKG deferred. He had EKG performed July 2021 as part of his pre-operative evaluation with Cardiology.   5. Class 1 obesity due to excess calories with serious comorbidity and body mass index (BMI) of 34.0 to 34.9 in adult  He is encouraged to initially strive for BMI less than 30 to decrease cardiac risk. He is advised to exercise no less than 150 minutes per week.     Patient was given opportunity to ask questions. Patient verbalized understanding of the plan and was able to repeat key elements of the plan. All questions were answered to their satisfaction.   Gwynneth Aliment, MD   I, Gwynneth Aliment, MD, have reviewed all documentation for this visit. The documentation on 12/10/19 for the exam, diagnosis, procedures, and orders are all accurate and complete.  THE PATIENT IS ENCOURAGED TO PRACTICE SOCIAL DISTANCING DUE TO THE COVID-19 PANDEMIC.

## 2019-12-10 NOTE — Patient Instructions (Signed)

## 2019-12-11 LAB — VITAMIN D 25 HYDROXY (VIT D DEFICIENCY, FRACTURES): Vit D, 25-Hydroxy: 16.8 ng/mL — ABNORMAL LOW (ref 30.0–100.0)

## 2019-12-11 LAB — LIPID PANEL
Chol/HDL Ratio: 3.4 ratio (ref 0.0–5.0)
Cholesterol, Total: 130 mg/dL (ref 100–199)
HDL: 38 mg/dL — ABNORMAL LOW (ref >39)
LDL Chol Calc (NIH): 77 mg/dL (ref 0–99)
Triglycerides: 76 mg/dL (ref 0–149)
VLDL Cholesterol Cal: 15 mg/dL (ref 5–40)

## 2019-12-11 LAB — TSH: TSH: 0.897 u[IU]/mL (ref 0.450–4.500)

## 2019-12-11 LAB — T4, FREE: Free T4: 1.57 ng/dL (ref 0.82–1.77)

## 2019-12-11 LAB — PSA: Prostate Specific Ag, Serum: 0.3 ng/mL (ref 0.0–4.0)

## 2019-12-12 ENCOUNTER — Other Ambulatory Visit: Payer: Self-pay | Admitting: Internal Medicine

## 2019-12-12 MED ORDER — VITAMIN D (ERGOCALCIFEROL) 1.25 MG (50000 UNIT) PO CAPS: ORAL_CAPSULE | ORAL | 0 refills | Status: AC

## 2019-12-14 ENCOUNTER — Telehealth: Payer: Self-pay

## 2019-12-14 NOTE — Telephone Encounter (Signed)
-----   Message from Dorothyann Peng, MD sent at 12/12/2019  2:19 PM EDT ----- Here are your lab results: Your vitamin D is quite low. I will send prescription for vitamin D to the pharmacy, please take as directed.   Your HDL, good cholesterol is low, this will improve as you are able to increase your daily activity. Try to increase your fish intake as well.    Your PSA is within normal limits. Your thyroid function is normal. Continue with current supplementation.   Please let me know if you have any questions or concerns. Stay safe!   Sincerely,    Robyn N. Allyne Gee, MD

## 2019-12-17 ENCOUNTER — Telehealth: Payer: Self-pay

## 2019-12-17 ENCOUNTER — Other Ambulatory Visit: Payer: Self-pay | Admitting: Internal Medicine

## 2019-12-17 ENCOUNTER — Other Ambulatory Visit: Payer: Self-pay

## 2019-12-17 DIAGNOSIS — M25562 Pain in left knee: Secondary | ICD-10-CM

## 2019-12-17 NOTE — Telephone Encounter (Signed)
The pt said that Dr Lanney Gins at [facility] wants the pt to have his a1c rechecked and that they don't have a lab there.  The pt came to the office so that his lab work can be done.  The order was placed and Babish, MD was cc'd to get the results.

## 2019-12-18 LAB — HGB A1C W/O EAG: Hgb A1c MFr Bld: 7.9 % — ABNORMAL HIGH (ref 4.8–5.6)

## 2019-12-19 LAB — POC HEMOCCULT BLD/STL (OFFICE/1-CARD/DIAGNOSTIC)
Card #1 Date: 8052021
Fecal Occult Blood, POC: NEGATIVE

## 2019-12-21 ENCOUNTER — Telehealth: Payer: Self-pay

## 2019-12-21 ENCOUNTER — Other Ambulatory Visit (HOSPITAL_COMMUNITY): Payer: Self-pay | Admitting: Nurse Practitioner

## 2019-12-21 NOTE — Telephone Encounter (Signed)
The pt called and left a message that his orthopedic office said that his a1c came down some to 7.9 and that they need it 7.5 or under to do his surgery.  The pt was notified that Dr Allyne Gee got his message and that she said ok for the pt to keep working hard.

## 2019-12-21 NOTE — Telephone Encounter (Signed)
The pt wanted to know if he had to come and do blood work to recheck his a1c on sept 1st since he came last week and had it checked for his orthopedic to see if he can have surgery.  The pt was told that Dr Allyne Gee said no.

## 2019-12-22 NOTE — Telephone Encounter (Signed)
Pt was being seen by Rudi Coco, NP at [facility], pt has not been seen since 2019, they are stating that Dr. Mayford Knife now follows the pt. Would Dr. Mayford Knife like to refill pt's metoprolol? Please address

## 2019-12-24 LAB — HM DIABETES EYE EXAM

## 2019-12-29 ENCOUNTER — Encounter: Payer: Self-pay | Admitting: Internal Medicine

## 2020-01-01 ENCOUNTER — Encounter (HOSPITAL_COMMUNITY): Payer: Self-pay | Admitting: *Deleted

## 2020-01-01 ENCOUNTER — Emergency Department (HOSPITAL_COMMUNITY): Payer: Commercial Managed Care - PPO

## 2020-01-01 ENCOUNTER — Other Ambulatory Visit: Payer: Self-pay

## 2020-01-01 ENCOUNTER — Inpatient Hospital Stay (HOSPITAL_COMMUNITY)
Admission: EM | Admit: 2020-01-01 | Discharge: 2020-02-05 | DRG: 207 | Disposition: E | Payer: Commercial Managed Care - PPO | Attending: Critical Care Medicine | Admitting: Critical Care Medicine

## 2020-01-01 DIAGNOSIS — K7200 Acute and subacute hepatic failure without coma: Secondary | ICD-10-CM | POA: Diagnosis present

## 2020-01-01 DIAGNOSIS — G4733 Obstructive sleep apnea (adult) (pediatric): Secondary | ICD-10-CM | POA: Diagnosis present

## 2020-01-01 DIAGNOSIS — I5021 Acute systolic (congestive) heart failure: Secondary | ICD-10-CM | POA: Diagnosis present

## 2020-01-01 DIAGNOSIS — I493 Ventricular premature depolarization: Secondary | ICD-10-CM | POA: Diagnosis not present

## 2020-01-01 DIAGNOSIS — I6340 Cerebral infarction due to embolism of unspecified cerebral artery: Secondary | ICD-10-CM | POA: Insufficient documentation

## 2020-01-01 DIAGNOSIS — J690 Pneumonitis due to inhalation of food and vomit: Principal | ICD-10-CM | POA: Diagnosis present

## 2020-01-01 DIAGNOSIS — Z8673 Personal history of transient ischemic attack (TIA), and cerebral infarction without residual deficits: Secondary | ICD-10-CM

## 2020-01-01 DIAGNOSIS — I4892 Unspecified atrial flutter: Secondary | ICD-10-CM | POA: Diagnosis present

## 2020-01-01 DIAGNOSIS — E872 Acidosis: Secondary | ICD-10-CM | POA: Diagnosis present

## 2020-01-01 DIAGNOSIS — I110 Hypertensive heart disease with heart failure: Secondary | ICD-10-CM | POA: Diagnosis present

## 2020-01-01 DIAGNOSIS — R7989 Other specified abnormal findings of blood chemistry: Secondary | ICD-10-CM

## 2020-01-01 DIAGNOSIS — Z7901 Long term (current) use of anticoagulants: Secondary | ICD-10-CM

## 2020-01-01 DIAGNOSIS — J80 Acute respiratory distress syndrome: Secondary | ICD-10-CM | POA: Diagnosis present

## 2020-01-01 DIAGNOSIS — E8729 Other acidosis: Secondary | ICD-10-CM

## 2020-01-01 DIAGNOSIS — I2510 Atherosclerotic heart disease of native coronary artery without angina pectoris: Secondary | ICD-10-CM | POA: Diagnosis present

## 2020-01-01 DIAGNOSIS — Z823 Family history of stroke: Secondary | ICD-10-CM

## 2020-01-01 DIAGNOSIS — I4901 Ventricular fibrillation: Secondary | ICD-10-CM | POA: Diagnosis present

## 2020-01-01 DIAGNOSIS — I444 Left anterior fascicular block: Secondary | ICD-10-CM | POA: Diagnosis present

## 2020-01-01 DIAGNOSIS — R29728 NIHSS score 28: Secondary | ICD-10-CM | POA: Diagnosis not present

## 2020-01-01 DIAGNOSIS — G931 Anoxic brain damage, not elsewhere classified: Secondary | ICD-10-CM | POA: Diagnosis present

## 2020-01-01 DIAGNOSIS — R570 Cardiogenic shock: Secondary | ICD-10-CM | POA: Diagnosis present

## 2020-01-01 DIAGNOSIS — Z8249 Family history of ischemic heart disease and other diseases of the circulatory system: Secondary | ICD-10-CM

## 2020-01-01 DIAGNOSIS — I462 Cardiac arrest due to underlying cardiac condition: Secondary | ICD-10-CM | POA: Diagnosis present

## 2020-01-01 DIAGNOSIS — J9690 Respiratory failure, unspecified, unspecified whether with hypoxia or hypercapnia: Secondary | ICD-10-CM

## 2020-01-01 DIAGNOSIS — Z66 Do not resuscitate: Secondary | ICD-10-CM | POA: Diagnosis not present

## 2020-01-01 DIAGNOSIS — R270 Ataxia, unspecified: Secondary | ICD-10-CM | POA: Diagnosis present

## 2020-01-01 DIAGNOSIS — Z4659 Encounter for fitting and adjustment of other gastrointestinal appliance and device: Secondary | ICD-10-CM

## 2020-01-01 DIAGNOSIS — I255 Ischemic cardiomyopathy: Secondary | ICD-10-CM | POA: Diagnosis present

## 2020-01-01 DIAGNOSIS — J9601 Acute respiratory failure with hypoxia: Secondary | ICD-10-CM | POA: Diagnosis not present

## 2020-01-01 DIAGNOSIS — R0902 Hypoxemia: Secondary | ICD-10-CM

## 2020-01-01 DIAGNOSIS — D6489 Other specified anemias: Secondary | ICD-10-CM | POA: Diagnosis not present

## 2020-01-01 DIAGNOSIS — I214 Non-ST elevation (NSTEMI) myocardial infarction: Secondary | ICD-10-CM

## 2020-01-01 DIAGNOSIS — R451 Restlessness and agitation: Secondary | ICD-10-CM | POA: Diagnosis present

## 2020-01-01 DIAGNOSIS — Z9911 Dependence on respirator [ventilator] status: Secondary | ICD-10-CM

## 2020-01-01 DIAGNOSIS — M109 Gout, unspecified: Secondary | ICD-10-CM | POA: Diagnosis present

## 2020-01-01 DIAGNOSIS — Z87891 Personal history of nicotine dependence: Secondary | ICD-10-CM

## 2020-01-01 DIAGNOSIS — I77810 Thoracic aortic ectasia: Secondary | ICD-10-CM | POA: Diagnosis present

## 2020-01-01 DIAGNOSIS — I420 Dilated cardiomyopathy: Secondary | ICD-10-CM | POA: Diagnosis present

## 2020-01-01 DIAGNOSIS — E039 Hypothyroidism, unspecified: Secondary | ICD-10-CM | POA: Diagnosis present

## 2020-01-01 DIAGNOSIS — Z6839 Body mass index (BMI) 39.0-39.9, adult: Secondary | ICD-10-CM

## 2020-01-01 DIAGNOSIS — D696 Thrombocytopenia, unspecified: Secondary | ICD-10-CM | POA: Diagnosis not present

## 2020-01-01 DIAGNOSIS — E875 Hyperkalemia: Secondary | ICD-10-CM | POA: Diagnosis not present

## 2020-01-01 DIAGNOSIS — I63431 Cerebral infarction due to embolism of right posterior cerebral artery: Secondary | ICD-10-CM | POA: Diagnosis not present

## 2020-01-01 DIAGNOSIS — R34 Anuria and oliguria: Secondary | ICD-10-CM | POA: Diagnosis not present

## 2020-01-01 DIAGNOSIS — I4819 Other persistent atrial fibrillation: Secondary | ICD-10-CM | POA: Diagnosis present

## 2020-01-01 DIAGNOSIS — Z79899 Other long term (current) drug therapy: Secondary | ICD-10-CM

## 2020-01-01 DIAGNOSIS — Z794 Long term (current) use of insulin: Secondary | ICD-10-CM

## 2020-01-01 DIAGNOSIS — Z452 Encounter for adjustment and management of vascular access device: Secondary | ICD-10-CM

## 2020-01-01 DIAGNOSIS — I472 Ventricular tachycardia: Secondary | ICD-10-CM | POA: Diagnosis not present

## 2020-01-01 DIAGNOSIS — Z8616 Personal history of COVID-19: Secondary | ICD-10-CM

## 2020-01-01 DIAGNOSIS — N179 Acute kidney failure, unspecified: Secondary | ICD-10-CM

## 2020-01-01 DIAGNOSIS — E785 Hyperlipidemia, unspecified: Secondary | ICD-10-CM | POA: Diagnosis present

## 2020-01-01 DIAGNOSIS — Z79890 Hormone replacement therapy (postmenopausal): Secondary | ICD-10-CM

## 2020-01-01 DIAGNOSIS — N170 Acute kidney failure with tubular necrosis: Secondary | ICD-10-CM | POA: Diagnosis present

## 2020-01-01 DIAGNOSIS — E1165 Type 2 diabetes mellitus with hyperglycemia: Secondary | ICD-10-CM | POA: Diagnosis present

## 2020-01-01 DIAGNOSIS — I469 Cardiac arrest, cause unspecified: Secondary | ICD-10-CM

## 2020-01-01 DIAGNOSIS — Z515 Encounter for palliative care: Secondary | ICD-10-CM | POA: Diagnosis not present

## 2020-01-01 MED ORDER — FENTANYL CITRATE (PF) 100 MCG/2ML IJ SOLN
INTRAMUSCULAR | Status: AC
  Filled 2020-01-01: qty 2

## 2020-01-01 MED ORDER — DIAZEPAM 5 MG/ML IJ SOLN
INTRAMUSCULAR | Status: AC | PRN
  Administered 2020-01-01: 5 mg via INTRAVENOUS

## 2020-01-01 MED ORDER — MIDAZOLAM HCL 2 MG/2ML IJ SOLN
INTRAMUSCULAR | Status: AC
  Filled 2020-01-01: qty 6

## 2020-01-01 MED ORDER — FENTANYL CITRATE (PF) 100 MCG/2ML IJ SOLN
INTRAMUSCULAR | Status: AC | PRN
  Administered 2020-01-01: 100 ug via INTRAVENOUS

## 2020-01-01 NOTE — ED Triage Notes (Addendum)
Pt had witnessed arrrest at home with wife. Per EMS , pt collapsed at home, cpr started by wife. Initial rhythm VF, shocked x 11, given epi x 8, 2g mag, 450mg  amio.Reported rosc after cpr. Intubated with 8.0 tube pta, io to L tibia, 16 g R Ac  EMS call time 2154, fire continued CPR, EMS took over cpr at 2211, ROSC at 2243

## 2020-01-02 ENCOUNTER — Inpatient Hospital Stay (HOSPITAL_COMMUNITY): Payer: Commercial Managed Care - PPO

## 2020-01-02 ENCOUNTER — Other Ambulatory Visit: Payer: Self-pay | Admitting: Internal Medicine

## 2020-01-02 ENCOUNTER — Emergency Department (HOSPITAL_COMMUNITY): Payer: Commercial Managed Care - PPO

## 2020-01-02 DIAGNOSIS — N179 Acute kidney failure, unspecified: Secondary | ICD-10-CM | POA: Diagnosis not present

## 2020-01-02 DIAGNOSIS — J690 Pneumonitis due to inhalation of food and vomit: Secondary | ICD-10-CM | POA: Diagnosis present

## 2020-01-02 DIAGNOSIS — I110 Hypertensive heart disease with heart failure: Secondary | ICD-10-CM | POA: Diagnosis present

## 2020-01-02 DIAGNOSIS — Z7189 Other specified counseling: Secondary | ICD-10-CM | POA: Diagnosis not present

## 2020-01-02 DIAGNOSIS — G931 Anoxic brain damage, not elsewhere classified: Secondary | ICD-10-CM | POA: Diagnosis present

## 2020-01-02 DIAGNOSIS — I469 Cardiac arrest, cause unspecified: Secondary | ICD-10-CM | POA: Diagnosis not present

## 2020-01-02 DIAGNOSIS — K7200 Acute and subacute hepatic failure without coma: Secondary | ICD-10-CM | POA: Diagnosis present

## 2020-01-02 DIAGNOSIS — I472 Ventricular tachycardia: Secondary | ICD-10-CM | POA: Diagnosis not present

## 2020-01-02 DIAGNOSIS — J9601 Acute respiratory failure with hypoxia: Secondary | ICD-10-CM | POA: Diagnosis present

## 2020-01-02 DIAGNOSIS — R570 Cardiogenic shock: Secondary | ICD-10-CM | POA: Diagnosis present

## 2020-01-02 DIAGNOSIS — I462 Cardiac arrest due to underlying cardiac condition: Secondary | ICD-10-CM | POA: Diagnosis present

## 2020-01-02 DIAGNOSIS — N170 Acute kidney failure with tubular necrosis: Secondary | ICD-10-CM | POA: Diagnosis present

## 2020-01-02 DIAGNOSIS — I5021 Acute systolic (congestive) heart failure: Secondary | ICD-10-CM | POA: Diagnosis present

## 2020-01-02 DIAGNOSIS — R579 Shock, unspecified: Secondary | ICD-10-CM | POA: Diagnosis not present

## 2020-01-02 DIAGNOSIS — I420 Dilated cardiomyopathy: Secondary | ICD-10-CM | POA: Diagnosis present

## 2020-01-02 DIAGNOSIS — G4733 Obstructive sleep apnea (adult) (pediatric): Secondary | ICD-10-CM | POA: Diagnosis present

## 2020-01-02 DIAGNOSIS — Z8616 Personal history of COVID-19: Secondary | ICD-10-CM | POA: Diagnosis not present

## 2020-01-02 DIAGNOSIS — I63431 Cerebral infarction due to embolism of right posterior cerebral artery: Secondary | ICD-10-CM | POA: Diagnosis not present

## 2020-01-02 DIAGNOSIS — R739 Hyperglycemia, unspecified: Secondary | ICD-10-CM | POA: Diagnosis not present

## 2020-01-02 DIAGNOSIS — J80 Acute respiratory distress syndrome: Secondary | ICD-10-CM | POA: Diagnosis present

## 2020-01-02 DIAGNOSIS — Z9911 Dependence on respirator [ventilator] status: Secondary | ICD-10-CM | POA: Diagnosis not present

## 2020-01-02 DIAGNOSIS — E1165 Type 2 diabetes mellitus with hyperglycemia: Secondary | ICD-10-CM | POA: Diagnosis present

## 2020-01-02 DIAGNOSIS — I4901 Ventricular fibrillation: Secondary | ICD-10-CM | POA: Diagnosis present

## 2020-01-02 DIAGNOSIS — I214 Non-ST elevation (NSTEMI) myocardial infarction: Secondary | ICD-10-CM | POA: Insufficient documentation

## 2020-01-02 DIAGNOSIS — E872 Acidosis: Secondary | ICD-10-CM | POA: Diagnosis present

## 2020-01-02 DIAGNOSIS — Z66 Do not resuscitate: Secondary | ICD-10-CM | POA: Diagnosis not present

## 2020-01-02 DIAGNOSIS — Z515 Encounter for palliative care: Secondary | ICD-10-CM | POA: Diagnosis not present

## 2020-01-02 DIAGNOSIS — I4892 Unspecified atrial flutter: Secondary | ICD-10-CM | POA: Diagnosis present

## 2020-01-02 DIAGNOSIS — I4819 Other persistent atrial fibrillation: Secondary | ICD-10-CM | POA: Diagnosis present

## 2020-01-02 DIAGNOSIS — R569 Unspecified convulsions: Secondary | ICD-10-CM | POA: Diagnosis not present

## 2020-01-02 DIAGNOSIS — J9602 Acute respiratory failure with hypercapnia: Secondary | ICD-10-CM | POA: Diagnosis not present

## 2020-01-02 LAB — URINALYSIS, ROUTINE W REFLEX MICROSCOPIC
Bilirubin Urine: NEGATIVE
Glucose, UA: >=500 mg/dL — AB
Ketones, ur: NEGATIVE mg/dL
Leukocytes,Ua: NEGATIVE
Nitrite: NEGATIVE
Protein, ur: >=300 mg/dL — AB
Specific Gravity, Urine: 1.016 (ref 1.005–1.030)
pH: 5 (ref 5.0–8.0)

## 2020-01-02 LAB — COMPREHENSIVE METABOLIC PANEL
ALT: 207 U/L — ABNORMAL HIGH (ref 0–44)
AST: 198 U/L — ABNORMAL HIGH (ref 15–41)
Albumin: 3.9 g/dL (ref 3.5–5.0)
Alkaline Phosphatase: 132 U/L — ABNORMAL HIGH (ref 38–126)
Anion gap: 17 — ABNORMAL HIGH (ref 5–15)
BUN: 15 mg/dL (ref 8–23)
CO2: 19 mmol/L — ABNORMAL LOW (ref 22–32)
Calcium: 8.8 mg/dL — ABNORMAL LOW (ref 8.9–10.3)
Chloride: 104 mmol/L (ref 98–111)
Creatinine, Ser: 1.94 mg/dL — ABNORMAL HIGH (ref 0.61–1.24)
GFR calc Af Amer: 41 mL/min — ABNORMAL LOW (ref >60)
GFR calc non Af Amer: 36 mL/min — ABNORMAL LOW (ref >60)
Glucose, Bld: 260 mg/dL — ABNORMAL HIGH (ref 70–99)
Potassium: 4.2 mmol/L (ref 3.5–5.1)
Sodium: 140 mmol/L (ref 135–145)
Total Bilirubin: 1.4 mg/dL — ABNORMAL HIGH (ref 0.3–1.2)
Total Protein: 6.9 g/dL (ref 6.5–8.1)

## 2020-01-02 LAB — CBC
HCT: 60.8 % — ABNORMAL HIGH (ref 39.0–52.0)
HCT: 58.5 % — ABNORMAL HIGH (ref 39.0–52.0)
Hemoglobin: 18.9 g/dL — ABNORMAL HIGH (ref 13.0–17.0)
Hemoglobin: 18.5 g/dL — ABNORMAL HIGH (ref 13.0–17.0)
MCH: 27.5 pg (ref 26.0–34.0)
MCH: 27.6 pg (ref 26.0–34.0)
MCHC: 31.1 g/dL (ref 30.0–36.0)
MCHC: 31.6 g/dL (ref 30.0–36.0)
MCV: 88.5 fL (ref 80.0–100.0)
MCV: 87.2 fL (ref 80.0–100.0)
Platelets: 243 10*3/uL (ref 150–400)
Platelets: 187 10*3/uL (ref 150–400)
RBC: 6.87 MIL/uL — ABNORMAL HIGH (ref 4.22–5.81)
RBC: 6.71 MIL/uL — ABNORMAL HIGH (ref 4.22–5.81)
RDW: 16.5 % — ABNORMAL HIGH (ref 11.5–15.5)
RDW: 16.5 % — ABNORMAL HIGH (ref 11.5–15.5)
WBC: 1.9 10*3/uL — ABNORMAL LOW (ref 4.0–10.5)
WBC: 1.5 10*3/uL — ABNORMAL LOW (ref 4.0–10.5)
nRBC: 1 % — ABNORMAL HIGH (ref 0.0–0.2)
nRBC: 1.4 % — ABNORMAL HIGH (ref 0.0–0.2)

## 2020-01-02 LAB — I-STAT ARTERIAL BLOOD GAS, ED
Acid-base deficit: 7 mmol/L — ABNORMAL HIGH (ref 0.0–2.0)
Acid-base deficit: 9 mmol/L — ABNORMAL HIGH (ref 0.0–2.0)
Bicarbonate: 16.9 mmol/L — ABNORMAL LOW (ref 20.0–28.0)
Bicarbonate: 20.4 mmol/L (ref 20.0–28.0)
Calcium, Ion: 1.16 mmol/L (ref 1.15–1.40)
Calcium, Ion: 1.2 mmol/L (ref 1.15–1.40)
HCT: 50 % (ref 39.0–52.0)
HCT: 55 % — ABNORMAL HIGH (ref 39.0–52.0)
Hemoglobin: 17 g/dL (ref 13.0–17.0)
Hemoglobin: 18.7 g/dL — ABNORMAL HIGH (ref 13.0–17.0)
O2 Saturation: 89 %
O2 Saturation: 90 %
Patient temperature: 98.7
Patient temperature: 98.8
Potassium: 3.8 mmol/L (ref 3.5–5.1)
Potassium: 3.9 mmol/L (ref 3.5–5.1)
Sodium: 138 mmol/L (ref 135–145)
Sodium: 139 mmol/L (ref 135–145)
TCO2: 18 mmol/L — ABNORMAL LOW (ref 22–32)
TCO2: 22 mmol/L (ref 22–32)
pCO2 arterial: 36 mmHg (ref 32.0–48.0)
pCO2 arterial: 46.9 mmHg (ref 32.0–48.0)
pH, Arterial: 7.247 — ABNORMAL LOW (ref 7.350–7.450)
pH, Arterial: 7.28 — ABNORMAL LOW (ref 7.350–7.450)
pO2, Arterial: 63 mmHg — ABNORMAL LOW (ref 83.0–108.0)
pO2, Arterial: 70 mmHg — ABNORMAL LOW (ref 83.0–108.0)

## 2020-01-02 LAB — TYPE AND SCREEN
ABO/RH(D): O POS
Antibody Screen: NEGATIVE

## 2020-01-02 LAB — HEMOGLOBIN A1C
Hgb A1c MFr Bld: 7.5 % — ABNORMAL HIGH (ref 4.8–5.6)
Mean Plasma Glucose: 168.55 mg/dL

## 2020-01-02 LAB — HEPATIC FUNCTION PANEL
ALT: 185 U/L — ABNORMAL HIGH (ref 0–44)
AST: 332 U/L — ABNORMAL HIGH (ref 15–41)
Albumin: 3.6 g/dL (ref 3.5–5.0)
Alkaline Phosphatase: 118 U/L (ref 38–126)
Bilirubin, Direct: 0.4 mg/dL — ABNORMAL HIGH (ref 0.0–0.2)
Indirect Bilirubin: 0.8 mg/dL (ref 0.3–0.9)
Total Bilirubin: 1.2 mg/dL (ref 0.3–1.2)
Total Protein: 6.4 g/dL — ABNORMAL LOW (ref 6.5–8.1)

## 2020-01-02 LAB — POCT I-STAT 7, (LYTES, BLD GAS, ICA,H+H)
Acid-base deficit: 10 mmol/L — ABNORMAL HIGH (ref 0.0–2.0)
Bicarbonate: 21.8 mmol/L (ref 20.0–28.0)
Calcium, Ion: 1.22 mmol/L (ref 1.15–1.40)
HCT: 58 % — ABNORMAL HIGH (ref 39.0–52.0)
Hemoglobin: 19.7 g/dL — ABNORMAL HIGH (ref 13.0–17.0)
O2 Saturation: 94 %
Patient temperature: 37.5
Potassium: 4.3 mmol/L (ref 3.5–5.1)
Sodium: 140 mmol/L (ref 135–145)
TCO2: 24 mmol/L (ref 22–32)
pCO2 arterial: 73.6 mmHg (ref 32.0–48.0)
pH, Arterial: 7.084 — CL (ref 7.350–7.450)
pO2, Arterial: 101 mmHg (ref 83.0–108.0)

## 2020-01-02 LAB — MAGNESIUM
Magnesium: 2.4 mg/dL (ref 1.7–2.4)
Magnesium: 2.2 mg/dL (ref 1.7–2.4)

## 2020-01-02 LAB — BASIC METABOLIC PANEL
Anion gap: 16 — ABNORMAL HIGH (ref 5–15)
Anion gap: 14 (ref 5–15)
BUN: 19 mg/dL (ref 8–23)
BUN: 24 mg/dL — ABNORMAL HIGH (ref 8–23)
CO2: 20 mmol/L — ABNORMAL LOW (ref 22–32)
CO2: 18 mmol/L — ABNORMAL LOW (ref 22–32)
Calcium: 8.9 mg/dL (ref 8.9–10.3)
Calcium: 8.7 mg/dL — ABNORMAL LOW (ref 8.9–10.3)
Chloride: 101 mmol/L (ref 98–111)
Chloride: 105 mmol/L (ref 98–111)
Creatinine, Ser: 2.4 mg/dL — ABNORMAL HIGH (ref 0.61–1.24)
Creatinine, Ser: 2.72 mg/dL — ABNORMAL HIGH (ref 0.61–1.24)
GFR calc Af Amer: 32 mL/min — ABNORMAL LOW (ref >60)
GFR calc Af Amer: 28 mL/min — ABNORMAL LOW (ref >60)
GFR calc non Af Amer: 28 mL/min — ABNORMAL LOW (ref >60)
GFR calc non Af Amer: 24 mL/min — ABNORMAL LOW (ref >60)
Glucose, Bld: 264 mg/dL — ABNORMAL HIGH (ref 70–99)
Glucose, Bld: 278 mg/dL — ABNORMAL HIGH (ref 70–99)
Potassium: 3.7 mmol/L (ref 3.5–5.1)
Potassium: 4.3 mmol/L (ref 3.5–5.1)
Sodium: 137 mmol/L (ref 135–145)
Sodium: 137 mmol/L (ref 135–145)

## 2020-01-02 LAB — CBC WITH DIFFERENTIAL/PLATELET
Abs Immature Granulocytes: 0.83 10*3/uL — ABNORMAL HIGH (ref 0.00–0.07)
Basophils Absolute: 0.2 10*3/uL — ABNORMAL HIGH (ref 0.0–0.1)
Basophils Relative: 1 %
Eosinophils Absolute: 0.2 10*3/uL (ref 0.0–0.5)
Eosinophils Relative: 1 %
HCT: 52.9 % — ABNORMAL HIGH (ref 39.0–52.0)
Hemoglobin: 16.6 g/dL (ref 13.0–17.0)
Immature Granulocytes: 4 %
Lymphocytes Relative: 38 %
Lymphs Abs: 8 10*3/uL — ABNORMAL HIGH (ref 0.7–4.0)
MCH: 27.8 pg (ref 26.0–34.0)
MCHC: 31.4 g/dL (ref 30.0–36.0)
MCV: 88.5 fL (ref 80.0–100.0)
Monocytes Absolute: 0.8 10*3/uL (ref 0.1–1.0)
Monocytes Relative: 4 %
Neutro Abs: 11 10*3/uL — ABNORMAL HIGH (ref 1.7–7.7)
Neutrophils Relative %: 52 %
Platelets: 220 10*3/uL (ref 150–400)
RBC: 5.98 MIL/uL — ABNORMAL HIGH (ref 4.22–5.81)
RDW: 14.9 % (ref 11.5–15.5)
WBC: 20.9 10*3/uL — ABNORMAL HIGH (ref 4.0–10.5)
nRBC: 0.1 % (ref 0.0–0.2)

## 2020-01-02 LAB — RAPID URINE DRUG SCREEN, HOSP PERFORMED
Amphetamines: NOT DETECTED
Barbiturates: NOT DETECTED
Benzodiazepines: POSITIVE — AB
Cocaine: NOT DETECTED
Opiates: NOT DETECTED
Tetrahydrocannabinol: NOT DETECTED

## 2020-01-02 LAB — HEPATITIS PANEL, ACUTE
HCV Ab: NONREACTIVE
Hep A IgM: NONREACTIVE
Hep B C IgM: NONREACTIVE
Hepatitis B Surface Ag: NONREACTIVE

## 2020-01-02 LAB — HIV ANTIBODY (ROUTINE TESTING W REFLEX): HIV Screen 4th Generation wRfx: NONREACTIVE

## 2020-01-02 LAB — GLUCOSE, CAPILLARY
Glucose-Capillary: 320 mg/dL — ABNORMAL HIGH (ref 70–99)
Glucose-Capillary: 321 mg/dL — ABNORMAL HIGH (ref 70–99)

## 2020-01-02 LAB — TROPONIN I (HIGH SENSITIVITY)
Troponin I (High Sensitivity): 1600 ng/L (ref <18)
Troponin I (High Sensitivity): >27000 ng/L (ref <18)

## 2020-01-02 LAB — PHOSPHORUS: Phosphorus: 5.9 mg/dL — ABNORMAL HIGH (ref 2.5–4.6)

## 2020-01-02 LAB — LACTIC ACID, PLASMA
Lactic Acid, Venous: 3.9 mmol/L (ref 0.5–1.9)
Lactic Acid, Venous: 6.8 mmol/L (ref 0.5–1.9)
Lactic Acid, Venous: 7.2 mmol/L (ref 0.5–1.9)

## 2020-01-02 LAB — TRIGLYCERIDES: Triglycerides: 198 mg/dL — ABNORMAL HIGH (ref <150)

## 2020-01-02 LAB — MRSA PCR SCREENING: MRSA by PCR: NEGATIVE

## 2020-01-02 LAB — CBG MONITORING, ED: Glucose-Capillary: 220 mg/dL — ABNORMAL HIGH (ref 70–99)

## 2020-01-02 LAB — SARS CORONAVIRUS 2 BY RT PCR (HOSPITAL ORDER, PERFORMED IN ~~LOC~~ HOSPITAL LAB): SARS Coronavirus 2: NEGATIVE

## 2020-01-02 MED ORDER — CHLORHEXIDINE GLUCONATE CLOTH 2 % EX PADS
6.0000 | MEDICATED_PAD | Freq: Every day | CUTANEOUS | Status: DC
  Administered 2020-01-02 – 2020-01-09 (×8): 6 via TOPICAL

## 2020-01-02 MED ORDER — ASPIRIN EC 325 MG PO TBEC: 325.0000 mg | DELAYED_RELEASE_TABLET | Freq: Every day | ORAL | Status: DC

## 2020-01-02 MED ORDER — ROCURONIUM BROMIDE 50 MG/5ML IV SOLN
100.0000 mg | Freq: Once | INTRAVENOUS | Status: AC
  Administered 2020-01-02: 100 mg via INTRAVENOUS

## 2020-01-02 MED ORDER — PANTOPRAZOLE SODIUM 40 MG IV SOLR
40.0000 mg | Freq: Every day | INTRAVENOUS | Status: DC
  Administered 2020-01-02 – 2020-01-09 (×8): 40 mg via INTRAVENOUS
  Filled 2020-01-02 (×8): qty 40

## 2020-01-02 MED ORDER — FENTANYL CITRATE (PF) 100 MCG/2ML IJ SOLN
INTRAMUSCULAR | Status: AC
  Administered 2020-01-02: 100 ug via INTRAVENOUS
  Filled 2020-01-02: qty 2

## 2020-01-02 MED ORDER — MIDAZOLAM 50MG/50ML (1MG/ML) PREMIX INFUSION: 0.5000 mg/h | INTRAVENOUS | Status: DC

## 2020-01-02 MED ORDER — ATORVASTATIN CALCIUM 80 MG PO TABS
80.0000 mg | ORAL_TABLET | Freq: Every day | ORAL | Status: DC
  Administered 2020-01-02 – 2020-01-04 (×3): 80 mg via ORAL
  Filled 2020-01-02 (×3): qty 1

## 2020-01-02 MED ORDER — SODIUM BICARBONATE 8.4 % IV SOLN
INTRAVENOUS | Status: DC
  Filled 2020-01-02 (×4): qty 850

## 2020-01-02 MED ORDER — LEVOTHYROXINE SODIUM 100 MCG PO TABS: 200.0000 ug | ORAL_TABLET | ORAL | Status: DC

## 2020-01-02 MED ORDER — FENTANYL CITRATE (PF) 100 MCG/2ML IJ SOLN: 100.0000 ug | Freq: Once | INTRAMUSCULAR | Status: AC

## 2020-01-02 MED ORDER — LACTATED RINGERS IV BOLUS
1000.0000 mL | Freq: Once | INTRAVENOUS | Status: AC
  Administered 2020-01-02: 1000 mL via INTRAVENOUS

## 2020-01-02 MED ORDER — SODIUM CHLORIDE 0.9 % IV SOLN
250.0000 mL | INTRAVENOUS | Status: DC
  Administered 2020-01-08: 250 mL via INTRAVENOUS

## 2020-01-02 MED ORDER — FENTANYL 2500MCG IN NS 250ML (10MCG/ML) PREMIX INFUSION
0.0000 ug/h | INTRAVENOUS | Status: DC
  Administered 2020-01-02: 125 ug/h via INTRAVENOUS
  Administered 2020-01-02: 25 ug/h via INTRAVENOUS
  Administered 2020-01-05: 175 ug/h via INTRAVENOUS
  Administered 2020-01-05: 15 ug/h via INTRAVENOUS
  Administered 2020-01-06: 150 ug/h via INTRAVENOUS
  Administered 2020-01-06: 400 ug/h via INTRAVENOUS
  Administered 2020-01-07: 50 ug/h via INTRAVENOUS
  Administered 2020-01-07: 300 ug/h via INTRAVENOUS
  Administered 2020-01-08: 200 ug/h via INTRAVENOUS
  Filled 2020-01-02 (×12): qty 250

## 2020-01-02 MED ORDER — INSULIN ASPART 100 UNIT/ML ~~LOC~~ SOLN
0.0000 [IU] | SUBCUTANEOUS | Status: DC
  Administered 2020-01-02: 11 [IU] via SUBCUTANEOUS
  Administered 2020-01-02: 5 [IU] via SUBCUTANEOUS
  Administered 2020-01-02: 11 [IU] via SUBCUTANEOUS
  Administered 2020-01-03: 4 [IU] via SUBCUTANEOUS
  Administered 2020-01-03: 5 [IU] via SUBCUTANEOUS
  Administered 2020-01-03 (×2): 2 [IU] via SUBCUTANEOUS
  Administered 2020-01-03: 3 [IU] via SUBCUTANEOUS
  Administered 2020-01-04: 2 [IU] via SUBCUTANEOUS
  Administered 2020-01-04: 3 [IU] via SUBCUTANEOUS
  Administered 2020-01-04 (×3): 5 [IU] via SUBCUTANEOUS
  Administered 2020-01-05: 8 [IU] via SUBCUTANEOUS
  Administered 2020-01-05: 5 [IU] via SUBCUTANEOUS
  Administered 2020-01-05: 8 [IU] via SUBCUTANEOUS
  Administered 2020-01-05: 5 [IU] via SUBCUTANEOUS
  Administered 2020-01-05 (×2): 3 [IU] via SUBCUTANEOUS
  Administered 2020-01-06 (×2): 5 [IU] via SUBCUTANEOUS
  Administered 2020-01-06: 3 [IU] via SUBCUTANEOUS
  Administered 2020-01-06: 2 [IU] via SUBCUTANEOUS
  Administered 2020-01-06 – 2020-01-07 (×4): 3 [IU] via SUBCUTANEOUS
  Administered 2020-01-07: 5 [IU] via SUBCUTANEOUS
  Administered 2020-01-07: 2 [IU] via SUBCUTANEOUS
  Administered 2020-01-07 – 2020-01-08 (×4): 3 [IU] via SUBCUTANEOUS
  Administered 2020-01-09: 2 [IU] via SUBCUTANEOUS
  Administered 2020-01-09 (×3): 3 [IU] via SUBCUTANEOUS

## 2020-01-02 MED ORDER — POLYETHYLENE GLYCOL 3350 17 G PO PACK
17.0000 g | PACK | Freq: Every day | ORAL | Status: DC | PRN
  Administered 2020-01-03: 17 g via ORAL
  Filled 2020-01-02: qty 1

## 2020-01-02 MED ORDER — ACETAMINOPHEN 160 MG/5ML PO SOLN
650.0000 mg | ORAL | Status: DC
  Administered 2020-01-02 – 2020-01-08 (×31): 650 mg
  Filled 2020-01-02 (×32): qty 20.3

## 2020-01-02 MED ORDER — HEPARIN (PORCINE) 25000 UT/250ML-% IV SOLN
1400.0000 [IU]/h | INTRAVENOUS | Status: DC
  Administered 2020-01-02: 1400 [IU]/h via INTRAVENOUS
  Filled 2020-01-02: qty 250

## 2020-01-02 MED ORDER — ROCURONIUM BROMIDE 10 MG/ML (PF) SYRINGE
PREFILLED_SYRINGE | INTRAVENOUS | Status: AC
  Filled 2020-01-02: qty 10

## 2020-01-02 MED ORDER — NOREPINEPHRINE 4 MG/250ML-% IV SOLN
0.0000 ug/min | INTRAVENOUS | Status: DC
  Administered 2020-01-02: 14 ug/min via INTRAVENOUS
  Administered 2020-01-02: 10 ug/min via INTRAVENOUS
  Administered 2020-01-03: 5 ug/min via INTRAVENOUS
  Filled 2020-01-02 (×6): qty 250

## 2020-01-02 MED ORDER — ACETAMINOPHEN 650 MG RE SUPP
650.0000 mg | RECTAL | Status: DC
  Administered 2020-01-02 – 2020-01-09 (×5): 650 mg via RECTAL
  Filled 2020-01-02 (×5): qty 1

## 2020-01-02 MED ORDER — DOCUSATE SODIUM 100 MG PO CAPS: 100.0000 mg | ORAL_CAPSULE | Freq: Two times a day (BID) | ORAL | Status: DC | PRN

## 2020-01-02 MED ORDER — APIXABAN 5 MG PO TABS: 5.0000 mg | ORAL_TABLET | Freq: Two times a day (BID) | ORAL | Status: DC

## 2020-01-02 MED ORDER — PROPOFOL 1000 MG/100ML IV EMUL
0.0000 ug/kg/min | INTRAVENOUS | Status: DC
  Administered 2020-01-02: 5 ug/kg/min via INTRAVENOUS
  Administered 2020-01-02: 25 ug/kg/min via INTRAVENOUS
  Administered 2020-01-03: 10 ug/kg/min via INTRAVENOUS
  Administered 2020-01-05: 15 ug/kg/min via INTRAVENOUS
  Administered 2020-01-05: 10 ug/kg/min via INTRAVENOUS
  Administered 2020-01-05: 25 ug/kg/min via INTRAVENOUS
  Administered 2020-01-06: 50 ug/kg/min via INTRAVENOUS
  Administered 2020-01-06: 40 ug/kg/min via INTRAVENOUS
  Administered 2020-01-06 (×2): 25 ug/kg/min via INTRAVENOUS
  Administered 2020-01-06 (×2): 50 ug/kg/min via INTRAVENOUS
  Administered 2020-01-07: 30 ug/kg/min via INTRAVENOUS
  Filled 2020-01-02 (×16): qty 100

## 2020-01-02 MED ORDER — LEVOTHYROXINE SODIUM 100 MCG PO TABS
100.0000 ug | ORAL_TABLET | ORAL | Status: DC
  Administered 2020-01-03: 100 ug via ORAL
  Filled 2020-01-02 (×2): qty 1

## 2020-01-02 MED ORDER — SODIUM CHLORIDE 0.9 % IV SOLN
3.0000 g | Freq: Three times a day (TID) | INTRAVENOUS | Status: AC
  Administered 2020-01-02 – 2020-01-06 (×14): 3 g via INTRAVENOUS
  Filled 2020-01-02 (×9): qty 3
  Filled 2020-01-02 (×2): qty 8
  Filled 2020-01-02 (×4): qty 3

## 2020-01-02 MED ORDER — NOREPINEPHRINE 4 MG/250ML-% IV SOLN
INTRAVENOUS | Status: AC
  Filled 2020-01-02: qty 250

## 2020-01-02 MED ORDER — LEVOTHYROXINE SODIUM 100 MCG PO TABS
200.0000 ug | ORAL_TABLET | ORAL | Status: DC
  Administered 2020-01-04: 200 ug via ORAL

## 2020-01-02 MED ORDER — MIDAZOLAM HCL 2 MG/2ML IJ SOLN
5.0000 mg | Freq: Once | INTRAMUSCULAR | Status: AC
  Administered 2020-01-02: 5 mg via INTRAVENOUS
  Filled 2020-01-02: qty 6

## 2020-01-02 MED ORDER — ACETAMINOPHEN 325 MG PO TABS
650.0000 mg | ORAL_TABLET | ORAL | Status: DC
  Administered 2020-01-02: 650 mg via ORAL

## 2020-01-02 MED ORDER — ASPIRIN 300 MG RE SUPP
300.0000 mg | Freq: Once | RECTAL | Status: AC
  Administered 2020-01-02: 300 mg via RECTAL
  Filled 2020-01-02: qty 1

## 2020-01-02 MED ORDER — FENTANYL CITRATE (PF) 100 MCG/2ML IJ SOLN
INTRAMUSCULAR | Status: AC
Start: 2020-01-02 — End: 2020-01-02
  Administered 2020-01-02: 100 ug via INTRAVENOUS
  Filled 2020-01-02: qty 2

## 2020-01-02 MED ORDER — FUROSEMIDE 10 MG/ML IJ SOLN
20.0000 mg | Freq: Once | INTRAMUSCULAR | Status: AC
  Administered 2020-01-02: 20 mg via INTRAVENOUS
  Filled 2020-01-02: qty 2

## 2020-01-02 NOTE — Progress Notes (Signed)
Recruitment maneuver performed per MD request. PC 30, RR 10, peep 18 and 100%. Patient tolerated for 1 minute until BP started dropping. Sats now at 91%.

## 2020-01-02 NOTE — Procedures (Signed)
Arterial Catheter Insertion Procedure Note  Colin [name]  [id number]  [birth year]  Date:01/02/20  Time:11:11 AM    Provider Performing: Farris Has    Procedure: Insertion of Arterial Line (81157) with US guidance (26203)   Indication(s) Blood pressure monitoring and/or need for frequent ABGs  Consent Unable to obtain consent due to emergent nature of procedure.  Anesthesia None   Time Out Verified patient identification, verified procedure, site/side was marked, verified correct patient position, special equipment/implants available, medications/allergies/relevant history reviewed, required imaging and test results available.   Sterile Technique Maximal sterile technique including full sterile barrier drape, hand hygiene, sterile gown, sterile gloves, mask, hair covering, sterile ultrasound probe cover (if used).   Procedure Description Area of catheter insertion was cleaned with chlorhexidine and draped in sterile fashion. Without real-time ultrasound guidance an arterial catheter was placed into the right radial artery.  Appropriate arterial tracings confirmed on monitor.     Complications/Tolerance None; patient tolerated the procedure well.   EBL Minimal   Specimen(s) None

## 2020-01-02 NOTE — H&P (Signed)
NAME:  Colin [name], MRN:  [id number], DOB:  [birth year], LOS: 0 ADMISSION DATE:  12/06/2019, CONSULTATION DATE:  01/02/20 REFERRING MD:  EDP, CHIEF COMPLAINT:  Cardiac arrest   Brief History   64 year old male past medical history of A. fib/a flutter on Eliquis, cardiomyopathy, hypertension, obesity, OSA who had a witnessed out of hospital V. fib arrest with 1 hour of CPR before ROSC obtained.  Intubated and PCCM consulted for admission  History of present illness   Colin [name] is a 64 year old male with past medical history of A. fib/a flutter s/p ablation on Eliquis, dilated aortic root cardiomyopathy, hypertension, obesity, OSA who has been in his usual state of health recently, though his wife noted Colin [name] seemed to be breathing heavier and was more tired than usual for about the last day.  She says Colin [name] never complains, just went to bed early.  This evening she was talking to him when Colin [name] collapsed in front of her.  She called 911 and started chest compressions.  EMS found patient in V-fib, Colin [name] was intubated and defibrillated multiple times, given amiodarone and a total of 7 mg of epi was obtained, estimated 1 hour of ACLS.  On arrival to the ED, patient was unresponsive, head CT without acute findings.  EKG with new anterior ST depressions and troponin 1,600.  Cardiology was consulted and confirmed no STEMI. blood work significant for lactic acidosis and leukocytosis with AKI and elevated LFTs.  Covid-19 negative no other source of infection noted.  PCCM consulted for admission  Past Medical History   has a past medical history of Atrial flutter (HCC), DCM (dilated cardiomyopathy) (HCC), Dilated aortic root (HCC) (04/15/2015), Hypertension, Obesities, morbid (HCC), OSA (obstructive sleep apnea), and Persistent atrial fibrillation (HCC).   Significant Hospital Events   8/28 Admit to PCCM  Consults:  cardiology  Procedures:  8/27 ETT  Significant Diagnostic Tests:  8/27 CXR>>Central vascular  congestion consistent with the recent CPR. 8/28 CT head>>Mild age-related atrophy and chronic microvascular ischemic changes. Old right occipital infarct.  Micro Data:  8/28 Sars-Cov-2>>negative  Antimicrobials:     Interim history/subjective:  Pt intermittently hypoxic on the vent, PEEP increased  Objective   Blood pressure 91/70, pulse (!) 51, temperature 97.8 F (36.6 C), resp. rate 16, height 5\' 11"  (1.803 m), weight 104.3 kg, SpO2 93 %.    Vent Mode: PRVC FiO2 (%):  [100 %] 100 % Set Rate:  [16 bmp] 16 bmp Vt Set:  [600 mL] 600 mL PEEP:  [8 cmH20] 8 cmH20 Plateau Pressure:  [19 cmH20] 19 cmH20  No intake or output data in the 24 hours ending 01/02/20 0147 Filed Weights   12/19/2019 2333  Weight: 104.3 kg    General:  Well-nourished M, intubated and sedated HEENT: MM pink/moist, ETT in place Neuro: unresponsive on Fentanyl, does not respond to pain, breathing over the vent, corneal reflexes intact, gag intact CV: s1s2 tachycardic, no m/r/g PULM:  CTAB on full vent support GI: soft, bsx4 active  Extremities: warm/dry, no edema  Skin: no rashes or lesions   Resolved Hospital Problem list     Assessment & Plan:    Witnessed, out of hospital V. fib cardiac arrest 1 hour ACLS before ROSC, CT head negative -Hypoxic episode in the ED with sedation unlikely PE given chronic anticoagulation -EKG consistent with NSTEMI --Maintain full vent support with SAT/SBT as tolerated -titrate Vent setting to maintain SpO2 greater than or equal to 90%. -HOB elevated 30 degrees. -Plateau pressures less  than 30 cm H20.  -Follow chest x-ray, ABG prn.   -Bronchial hygiene and RT/bronchodilator protocol. -Patient initially minimally responsive, increased agitation over ED course and started on fentanyl and propofol -Target normothermia unless cardiology recommends otherwise -EEG    NSTEMI, history hypertension, atrial fibrillation, cardiomyopathy Last echocardiogram with EF  55-60%. Initial troponin 1,600, appreciate cardiology recommendations -Received ASA, continue daily -Heparin per cardiology recs -Echocardiogram -Trend troponin -Soft blood pressure and elevated lactic acidosis, may be developing cardiogenic shock.  Start peripheral Levophed and if increasing requirements may need central line   Acute kidney injury Likely secondary to hypoperfusion -Monitor renal indices, urine output and electrolytes    Elevated LFTs -Likely secondary to shock liver, consider hepatitis panel and further work-up if rapidly rising   AGMA, lactic acidosis Likely secondary to prolonged cardiac arrest, no evidence of recent infection by history and no source on work-up thus far -Hold aggressive IV fluids in the setting of possible cardiogenic shock, trend metabolic panel and lactic acid  Best practice:  Diet: N.p.o. Pain/Anxiety/Delirium protocol (if indicated): Propofol/fentanyl VAP protocol (if indicated): HOB 30 degrees, suction as needed DVT prophylaxis: Heparin GI prophylaxis: Protonix Glucose control: SSI Mobility: Bedrest Code Status: Full code Family Communication: Patient's wife and multiple family members updated by phone Disposition: ICU  Labs   CBC: Recent Labs  Lab 12/06/2019 2333 01/02/20 0031  WBC 20.9*  --   NEUTROABS 11.0*  --   HGB 16.6 17.0  HCT 52.9* 50.0  MCV 88.5  --   PLT 220  --     Basic Metabolic Panel: Recent Labs  Lab 12/23/2019 2333 01/02/20 0031  NA 140 139  K 4.2 3.8  CL 104  --   CO2 19*  --   GLUCOSE 260*  --   BUN 15  --   CREATININE 1.94*  --   CALCIUM 8.8*  --    GFR: Estimated Creatinine Clearance: 47.9 mL/min (A) (by C-G formula based on SCr of 1.94 mg/dL (H)). Recent Labs  Lab 01/03/2020 2333  WBC 20.9*  LATICACIDVEN 7.2*    Liver Function Tests: Recent Labs  Lab 01/04/2020 2333  AST 198*  ALT 207*  ALKPHOS 132*  BILITOT 1.4*  PROT 6.9  ALBUMIN 3.9   No results for input(s): LIPASE, AMYLASE  in the last 168 hours. No results for input(s): AMMONIA in the last 168 hours.  ABG    Component Value Date/Time   PHART 7.247 (L) 01/02/2020 0031   PCO2ART 46.9 01/02/2020 0031   PO2ART 70 (L) 01/02/2020 0031   HCO3 20.4 01/02/2020 0031   TCO2 22 01/02/2020 0031   ACIDBASEDEF 7.0 (H) 01/02/2020 0031   O2SAT 90.0 01/02/2020 0031     Coagulation Profile: No results for input(s): INR, PROTIME in the last 168 hours.  Cardiac Enzymes: No results for input(s): CKTOTAL, CKMB, CKMBINDEX, TROPONINI in the last 168 hours.  HbA1C: Hgb A1c MFr Bld  Date/Time Value Ref Range Status  12/17/2019 03:54 PM 7.9 (H) 4.8 - 5.6 % Final    Comment:             Prediabetes: 5.7 - 6.4          Diabetes: >6.4          Glycemic control for adults with diabetes: <7.0   11/20/2019 09:37 AM 8.2 (H) 4.8 - 5.6 % Final    Comment:    (NOTE) Pre diabetes:          5.7%-6.4%  Diabetes:              >  6.4%  Glycemic control for   <7.0% adults with diabetes     CBG: No results for input(s): GLUCAP in the last 168 hours.  Review of Systems:   Unable to obtain secondary to mental status  Past Medical History  Colin [name],  has a past medical history of Atrial flutter (HCC), DCM (dilated cardiomyopathy) (HCC), Dilated aortic root (HCC) (04/15/2015), Hypertension, Obesities, morbid (HCC), OSA (obstructive sleep apnea), and Persistent atrial fibrillation (HCC).   Surgical History    Past Surgical History:  Procedure Laterality Date  . CARDIOVERSION N/A 09/24/2014   Procedure: CARDIOVERSION;  Surgeon: Quintella Reichertraci R Turner, MD;  [facility];  Service: Cardiovascular;  Laterality: N/A;  . CARDIOVERSION N/A 08/02/2017   Procedure: CARDIOVERSION;  Surgeon: Pricilla Riffleoss, Paula V, MD;  [facility];  Service: Cardiovascular;  Laterality: N/A;  . TEE WITHOUT CARDIOVERSION N/A 09/24/2014   Procedure: TRANSESOPHAGEAL ECHOCARDIOGRAM (TEE);  Surgeon: Quintella Reichertraci R Turner, MD;  [facility];  Service:  Cardiovascular;  Laterality: N/A;     Social History   reports that Colin [name] has quit smoking. His smoking use included cigarettes. Colin [name] has a 7.50 pack-year smoking history. Colin [name] has never used smokeless tobacco. Colin [name] reports that Colin [name] does not drink alcohol and does not use drugs.   Family History   His family history includes Heart Problems in his father and paternal grandfather; Stroke in his mother.   Allergies No Known Allergies   Home Medications  Prior to Admission medications   Medication Sig Start Date End Date Taking? Authorizing Provider  acetaminophen (TYLENOL) 500 MG tablet Take 1,000 mg by mouth every 6 (six) hours as needed for moderate pain or headache.   Yes [provider]  allopurinol (ZYLOPRIM) 100 MG tablet Take 1 tablet (100 mg total) by mouth 2 (two) times daily. 08/13/19 08/12/20 Yes Dorothyann PengSanders, Robyn, MD  atorvastatin (LIPITOR) 80 MG tablet Take 1 tablet (80 mg total) by mouth daily. 10/06/19  Yes Dorothyann PengSanders, Robyn, MD  colchicine 0.6 MG tablet Take 1 tablet (0.6 mg total) by mouth daily as needed. colcrys Patient taking differently: Take 0.6 mg by mouth daily as needed (gout).  07/01/19  Yes Dorothyann PengSanders, Robyn, MD  ELIQUIS 5 MG TABS tablet TAKE 1 TABLET BY MOUTH TWICE DAILY Patient taking differently: Take 5 mg by mouth 2 (two) times daily.  07/28/19  Yes Newman Niparroll, Donna C, NP  FARXIGA 5 MG TABS tablet TAKE 1 TABLET BY MOUTH DAILY Patient taking differently: Take 5 mg by mouth daily. Take 1 tablet by mouth daily 12/17/19  Yes Dorothyann PengSanders, Robyn, MD  levothyroxine Erline Levine(SYNTHROID, LEVOTHROID) 200 MCG tablet Take 1 tablet by mouth Monday - Saturday and 1/2 tablet on Sunday Patient taking differently: Take 100-200 mcg by mouth See admin instructions. Take 200 mcg Monday through Saturday and 100 mcg on Sundays 06/04/18  Yes Dorothyann PengSanders, Robyn, MD  metoprolol tartrate (LOPRESSOR) 50 MG tablet TAKE 1 TABLET BY MOUTH TWICE DAILY, APPOINTMENT REQUIRED FOR FURTHER REFILLS Patient taking differently: Take 50 mg  by mouth 2 (two) times daily.  12/22/19  Yes Turner, Traci R, MD  OZEMPIC, 0.25 OR 0.5 MG/DOSE, 2 MG/1.5ML SOPN INJECT 0.5 MG UNDER THE SKIN EVERY WEEK ON THE SAME DAY OF EACH WEEK, IN THE ABDOMINAL, THIGH, OR UPPER ARM ROTATING INJECTION SITES Patient taking differently: Inject 0.5 mg as directed every Thursday.  10/20/19  Yes Dorothyann PengSanders, Robyn, MD  TOUJEO SOLOSTAR 300 UNIT/ML Solostar Pen INJECT 40 UNITS INTO THE SKIN AS PER INSULIN PROTOCOL Patient taking differently: Inject 40  Units into the skin at bedtime.  08/24/19  Yes Dorothyann Peng, MD  Vitamin D, Ergocalciferol, (DRISDOL) 1.25 MG (50000 UNIT) CAPS capsule One capsule po twice weekly on Tuesdays/Fridays Patient taking differently: Take 50,000 Units by mouth See admin instructions. One capsule po twice weekly on Tuesdays/Fridays 12/12/19  Yes Dorothyann Peng, MD  B-D ULTRAFINE III SHORT PEN 31G X 8 MM MISC USE AS DIRECTED WITH LEVEMIR PEN 06/08/19   Dorothyann Peng, MD  sildenafil (VIAGRA) 100 MG tablet Take 1 tablet (100 mg total) by mouth daily as needed for erectile dysfunction. 09/09/19   Dorothyann Peng, MD     Critical care time: 65 minutes      CRITICAL CARE Performed by: Darcella Gasman Brittyn Salaz   Total critical care time: 65 minutes  Critical care time was exclusive of separately billable procedures and treating other patients.  Critical care was necessary to treat or prevent imminent or life-threatening deterioration.  Critical care was time spent personally by me on the following activities: development of treatment plan with patient and/or surrogate as well as nursing, discussions with consultants, evaluation of patient's response to treatment, examination of patient, obtaining history from patient or surrogate, ordering and performing treatments and interventions, ordering and review of laboratory studies, ordering and review of radiographic studies, pulse oximetry and re-evaluation of patient's condition.  Darcella Gasman Averlee Swartz, PA-C

## 2020-01-02 NOTE — ED Notes (Signed)
CCM at bedside; difficulty getting sats above 75%; currently at 83% on vent

## 2020-01-02 NOTE — Progress Notes (Signed)
eLink Physician-Brief Progress Note Patient Name: Colin [name] DOB: 1955/08/10 MRN: [id number]   Date of Service  01/02/2020  HPI/Events of Note  Family requested a change in code status to full code. I verified this by camera  Interaction with his wife.  eICU Interventions  Code status changed to full code per family's wish.        Migdalia Dk 01/02/2020, 9:53 PM

## 2020-01-02 NOTE — Progress Notes (Signed)
RT note: RT and RN transported vent patient from ED to 2H #6. Vital signs stable through out.

## 2020-01-02 NOTE — Progress Notes (Signed)
PCCM Interval note  I spoke with the patient's wife Colin [name] by phone.  Updated her on his status, labile blood pressure, severe respiratory failure and progressive hypoxemia.  I informed her that I believe were doing everything possible to achieve stability but that he has not become stable yet.  Also discussed with her the high likelihood that he has endorgan injuries from his prolonged CPR and that he unfortunately will not survive.  She understands.  She is still hopeful that there may be a chance to achieve stability and some turnaround.  I recommended to her that we continue mechanical ventilation, all medical interventions, and judge any potential procedures or other interventions based on their chances to enhance his survival to a meaningful quality of life.  I have recommended that he likely would not survive or benefit from repeat CPR if he were to acutely decompensate.  She understands the rationale and agrees.  I will place no CPR orders on the chart.  I informed her that we may need to place CVC in order to continue to work to achieve stability with pressors, other medical interventions.  She understood and will give consent for this if indicated.  Additional CC time 30 minutes  Levy Pupa, MD, PhD 01/02/2020, 12:39 PM [facility] [phone] or if no answer [phone]

## 2020-01-02 NOTE — Progress Notes (Signed)
PCCM Interval Note  Patient's wife and niece able to come to bedside.  I had a good conversation with him about the patient's status, current interventions, prognosis given the risk for severe endorgan injury due to prolonged downtime.  Both voiced understanding.  I have explained that despite all interventions he may not remain stable, particularly hemodynamically as he has had progressive hypotension.  Also explained that even if we maintain stability to follow progress that he likely will show [place] signs of severe endorgan injury with Long chance for meaningful recovery.  Based on this we have decided not to escalate his care further.  We will maintain his pressors at current level, norepinephrine 15.  Continue current mechanical ventilation, bicarbonate infusion, heparin infusion.  Most importantly we will maintain his fentanyl titrated for comfort.  Patient's wife is very clear that she does not want him to suffer.  She will wait for her daughters to arrive to see the patient.  Also wait for the patient's brother who is local.  Was to have a chance to visit they will consider a possible formal withdrawal of care versus maintaining on current level of care to see if he shows hemodynamic decline.  I will place a ceiling on the norepinephrine as above, defer central line placement.  Critical Care time 45 minutes  Levy Pupa, MD, PhD 01/02/2020, 2:58 PM [facility] [phone] or if no answer [phone]

## 2020-01-02 NOTE — ED Provider Notes (Signed)
[facility] EMERGENCY DEPARTMENT Provider Note   CSN: [id number] Arrival date & time: 12/20/2019  2324   History Chief Complaint  Patient presents with  . Cardiac Arrest    Colin [name] is a 64 y.o. male.  The history is provided by the spouse and the EMS personnel. The history is limited by the condition of the patient (Unresponsive).  Cardiac Arrest He has history of hypertension, diabetes, atrial fibrillation anticoagulated on apixaban and is brought in by EMS following successful resuscitation from out of hospital cardiac arrest.  He was talking with his wife when he suddenly fell face forward.  She started CPR immediately.  Fire rescue arrived and continued CPR.  EMS arrived and intubated the patient.  Rhythm was noted to be ventricular fibrillation.  He received several shocks for defibrillation without benefit.  He was given 2 boluses of amiodarone and a total of 7 mg of epinephrine and finally had return of spontaneous circulation.  Total amount of time spent with CPR was estimated about 1hour.  Rhythm has been stable since about 5 minutes before arrival in the ED.  Past Medical History:  Diagnosis Date  . Atrial flutter (HCC)   . DCM (dilated cardiomyopathy) (HCC)    EF 35-40% years ago but echo a year ago showed normal LVF  . Dilated aortic root (HCC) 04/15/2015   74mm by echo 06/2017 and Chest CT 12/2016  . Hypertension   . Obesities, morbid (HCC)   . OSA (obstructive sleep apnea)   . Persistent atrial fibrillation [facility])    s/p TEE/DCCV now on Sotolol    Patient Active Problem List   Diagnosis Date Noted  . Benign essential HTN 07/07/2017  . Homonymous bilateral field defects in visual field, left 08/02/2016  . Stroke (cerebrum) (HCC) 08/02/2016  . Visual field loss 06/20/2016  . Uncontrolled type 2 diabetes mellitus with stage 2 chronic kidney disease (HCC) 06/20/2016  . Dilated aortic root (HCC) 04/15/2015  . OSA on CPAP   . Persistent atrial  fibrillation (HCC) 10/10/2014  . Morbid obesity (HCC) 10/10/2014  . Atrial flutter (HCC) 09/24/2014    Past Surgical History:  Procedure Laterality Date  . CARDIOVERSION N/A 09/24/2014   Procedure: CARDIOVERSION;  Surgeon: Quintella Reichert, MD;  Location: [facility] ENDOSCOPY;  Service: Cardiovascular;  Laterality: N/A;  . CARDIOVERSION N/A 08/02/2017   Procedure: CARDIOVERSION;  Surgeon: Pricilla Riffle, MD;  Location: [facility] ENDOSCOPY;  Service: Cardiovascular;  Laterality: N/A;  . TEE WITHOUT CARDIOVERSION N/A 09/24/2014   Procedure: TRANSESOPHAGEAL ECHOCARDIOGRAM (TEE);  Surgeon: Quintella Reichert, MD;  Location: [facility] ENDOSCOPY;  Service: Cardiovascular;  Laterality: N/A;       Family History  Problem Relation Age of Onset  . Stroke Mother   . Heart Problems Father        weak heart  . Heart Problems Paternal Grandfather        pacemaker    Social History   Tobacco Use  . Smoking status: Former Smoker    Packs/day: 0.50    Years: 15.00    Pack years: 7.50    Types: Cigarettes  . Smokeless tobacco: Never Used  Vaping Use  . Vaping Use: Never used  Substance Use Topics  . Alcohol use: No    Alcohol/week: 0.0 standard drinks  . Drug use: No    Home Medications Prior to Admission medications   Medication Sig Start Date End Date Taking? Authorizing Provider  acetaminophen (TYLENOL) 500 MG tablet Take 1,000  mg by mouth every 6 (six) hours as needed for moderate pain or headache.    [provider]  allopurinol (ZYLOPRIM) 100 MG tablet Take 1 tablet (100 mg total) by mouth 2 (two) times daily. Patient taking differently: Take 100 mg by mouth 2 (two) times daily as needed (gout).  08/13/19 08/12/20  Dorothyann Peng, MD  atorvastatin (LIPITOR) 80 MG tablet Take 1 tablet (80 mg total) by mouth daily. 10/06/19   Dorothyann Peng, MD  B-D ULTRAFINE III SHORT PEN 31G X 8 MM MISC USE AS DIRECTED WITH LEVEMIR PEN 06/08/19   Dorothyann Peng, MD  colchicine 0.6 MG tablet Take 1 tablet (0.6 mg total) by  mouth daily as needed. colcrys Patient taking differently: Take 0.6 mg by mouth daily as needed (gout).  07/01/19   Dorothyann Peng, MD  ELIQUIS 5 MG TABS tablet TAKE 1 TABLET BY MOUTH TWICE DAILY Patient taking differently: Take 5 mg by mouth 2 (two) times daily.  07/28/19   Newman Nip, NP  FARXIGA 5 MG TABS tablet TAKE 1 TABLET BY MOUTH DAILY 12/17/19   Dorothyann Peng, MD  levothyroxine Erline Levine, LEVOTHROID) 200 MCG tablet Take 1 tablet by mouth Monday - Saturday and 1/2 tablet on Sunday Patient taking differently: Take 100-200 mcg by mouth See admin instructions. Take 200 mcg Monday through Saturday and 100 mcg on Sundays 06/04/18   Dorothyann Peng, MD  metoprolol tartrate (LOPRESSOR) 50 MG tablet TAKE 1 TABLET BY MOUTH TWICE DAILY, APPOINTMENT REQUIRED FOR FURTHER REFILLS 12/22/19   Quintella Reichert, MD  nystatin (NYSTATIN) powder Apply 1 application topically 3 (three) times daily. Patient not taking: Reported on 12/09/2019 11/11/19   Arnette Felts, FNP  nystatin cream (MYCOSTATIN) Apply 1 application topically 2 (two) times daily. Patient not taking: Reported on 12/09/2019 11/11/19   Arnette Felts, FNP  OZEMPIC, 0.25 OR 0.5 MG/DOSE, 2 MG/1.5ML SOPN INJECT 0.5 MG UNDER THE SKIN EVERY WEEK ON THE SAME DAY OF EACH WEEK, IN THE ABDOMINAL, THIGH, OR UPPER ARM ROTATING INJECTION SITES Patient taking differently: Inject 0.5 mg as directed every Thursday.  10/20/19   Dorothyann Peng, MD  sildenafil (VIAGRA) 100 MG tablet Take 1 tablet (100 mg total) by mouth daily as needed for erectile dysfunction. 09/09/19   Dorothyann Peng, MD  TOUJEO SOLOSTAR 300 UNIT/ML Solostar Pen INJECT 40 UNITS INTO THE SKIN AS PER INSULIN PROTOCOL Patient taking differently: Inject 40 Units into the skin at bedtime.  08/24/19   Dorothyann Peng, MD  Vitamin D, Ergocalciferol, (DRISDOL) 1.25 MG (50000 UNIT) CAPS capsule One capsule po twice weekly on Tuesdays/Fridays 12/12/19   Dorothyann Peng, MD    Allergies    Patient has no known  allergies.  Review of Systems   Review of Systems  Unable to perform ROS: Patient unresponsive    Physical Exam Updated Vital Signs BP 91/70   Pulse (!) 51   Temp 97.8 F (36.6 C)   Resp 16   Ht 5\' 11"  (1.803 m)   Wt 104.3 kg   SpO2 93%   BMI 32.08 kg/m   Physical Exam Vitals and nursing note reviewed.   64 year old male, intubated, but with spontaneous respirations. Vital signs are significant for slow heart rate and low blood pressure. Oxygen saturation is 93%, which is normal. Head is normocephalic and atraumatic.  Pupils are 4 mm and sluggishly reactive.  Corneal reflex is intact.endotracheal tube is in place. Neck is  without adenopathy or JVD. Lungs have diminished breath sounds on  the left.  Endotracheal tube is withdrawn slightly, and breath sounds were than equal.. Chest moves symmetrically. Heart has regular rate and rhythm without murmur. Abdomen is soft, flat.  Small umbilical hernia is present which is easily reducible. Extremities have no cyanosis or edema.  Intraosseous line is present on the left proximal tibia. Skin is warm and dry without rash. Neurologic: No response to verbal or painful stimuli, but does move both arms and both legs symmetrically.  He is breathing spontaneously and has intact corneal reflexes.  ED Results / Procedures / Treatments   Labs (all labs ordered are listed, but only abnormal results are displayed) Labs Reviewed  COMPREHENSIVE METABOLIC PANEL - Abnormal; Notable for the following components:      Result Value   CO2 19 (*)    Glucose, Bld 260 (*)    Creatinine, Ser 1.94 (*)    Calcium 8.8 (*)    AST 198 (*)    ALT 207 (*)    Alkaline Phosphatase 132 (*)    Total Bilirubin 1.4 (*)    GFR calc non Af Amer 36 (*)    GFR calc Af Amer 41 (*)    Anion gap 17 (*)    All other components within normal limits  CBC WITH DIFFERENTIAL/PLATELET - Abnormal; Notable for the following components:   WBC 20.9 (*)    RBC 5.98 (*)    HCT  52.9 (*)    Neutro Abs 11.0 (*)    Lymphs Abs 8.0 (*)    Basophils Absolute 0.2 (*)    Abs Immature Granulocytes 0.83 (*)    All other components within normal limits  LACTIC ACID, PLASMA - Abnormal; Notable for the following components:   Lactic Acid, Venous 7.2 (*)    All other components within normal limits  I-STAT ARTERIAL BLOOD GAS, ED - Abnormal; Notable for the following components:   pH, Arterial 7.247 (*)    pO2, Arterial 70 (*)    Acid-base deficit 7.0 (*)    All other components within normal limits  TROPONIN I (HIGH SENSITIVITY) - Abnormal; Notable for the following components:   Troponin I (High Sensitivity) 1,600 (*)    All other components within normal limits  SARS CORONAVIRUS 2 BY RT PCR (HOSPITAL ORDER, PERFORMED IN [place] [facility])  LACTIC ACID, PLASMA  RAPID URINE DRUG SCREEN, HOSP PERFORMED  URINALYSIS, ROUTINE W REFLEX MICROSCOPIC  BLOOD GAS, ARTERIAL  PROTIME-INR  APTT  PATHOLOGIST SMEAR REVIEW  TYPE AND SCREEN  TROPONIN I (HIGH SENSITIVITY)    EKG EKG Interpretation  Date/Time:  Friday 01/24/20 23:27:14 EDT Ventricular Rate:  107 PR Interval:    QRS Duration: 95 QT Interval:  331 QTC Calculation: 442 R Axis:   -72 Text Interpretation: Sinus tachycardia Ventricular premature complex Left anterior fascicular block Anteroseptal infarct, old ST depression in Anterior leads When compared with ECG of 04/15/2018, ST depression in Anterior leads is now present Confirmed by Dione Booze ([id number]) on January 24, 2020 11:30:58 PM   Radiology CT Head Wo Contrast  Result Date: 01/02/2020 CLINICAL DATA:  64 year old male with syncope. EXAM: CT HEAD WITHOUT CONTRAST TECHNIQUE: Contiguous axial images were obtained from the base of the skull through the vertex without intravenous contrast. COMPARISON:  None. FINDINGS: Brain: Mild age-related atrophy and chronic microvascular ischemic changes. Old right occipital infarct. The false appears slightly  prominent, possibly related to angulation. A trace parafalcine subdural hemorrhage is less likely. No other acute hemorrhage. No mass effect or midline  shift. Vascular: High attenuation of the intracranial vasculature, likely hemoconcentration/dehydration. Skull: Normal. Negative for fracture or focal lesion. Sinuses/Orbits: Partial opacification of the sphenoid sinuses with air-fluid level. The mastoid air cells are clear. Other: An endotracheal and an enteric tube are partially visualized. IMPRESSION: 1. No acute intracranial pathology. 2. Mild age-related atrophy and chronic microvascular ischemic changes. Old right occipital infarct. Electronically Signed   By: Elgie Collard M.D.   On: 01/02/2020 00:51   DG Chest Portable 1 View  Result Date: 12/07/2019 CLINICAL DATA:  Status post CPR and intubation EXAM: PORTABLE CHEST 1 VIEW COMPARISON:  None. FINDINGS: Cardiac shadow is enlarged. Endotracheal tube and gastric catheter are noted in satisfactory position. Diffuse central vascular congestion is noted without significant edema. No definitive rib fractures are pneumothorax is identified. IMPRESSION: Tubes and lines as described. Central vascular congestion consistent with the recent CPR. Electronically Signed   By: Alcide Clever M.D.   On: 01/05/2020 23:57    Procedures Procedures  CRITICAL CARE Performed by: Dione Booze Total critical care time: 60 minutes Critical care time was exclusive of separately billable procedures and treating other patients. Critical care was necessary to treat or prevent imminent or life-threatening deterioration. Critical care was time spent personally by me on the following activities: development of treatment plan with patient and/or surrogate as well as nursing, discussions with consultants, evaluation of patient's response to treatment, examination of patient, obtaining history from patient or surrogate, ordering and performing treatments and interventions, ordering  and review of laboratory studies, ordering and review of radiographic studies, pulse oximetry and re-evaluation of patient's condition.  Medications Ordered in ED Medications  fentaNYL (SUBLIMAZE) 100 MCG/2ML injection (has no administration in time range)  midazolam (VERSED) 2 MG/2ML injection (has no administration in time range)  fentaNYL (SUBLIMAZE) injection (100 mcg Intravenous Given 12/20/2019 2329)  diazepam (VALIUM) injection (5 mg Intravenous Given 12/15/2019 2329)  fentaNYL (SUBLIMAZE) injection 100 mcg (has no administration in time range)  midazolam (VERSED) injection 5 mg (has no administration in time range)  fentaNYL in NS (54mcg/ml) infusion-PREMIX (has no administration in time range)  midazolam (VERSED) 50 mg/50 mL (1 mg/mL) premix infusion (has no administration in time range)    ED Course  I have reviewed the triage vital signs and the nursing notes.  Pertinent labs & imaging results that were available during my care of the patient were reviewed by me and considered in my medical decision making (see chart for details).  MDM Rules/Calculators/A&P Out of hospital ventricular fibrillation arrest with return of spontaneous circulation.  History of atrial fibrillation anticoagulated on apixaban.  He appears to be a good candidate for therapeutic hypothermia.  Case is discussed with Dr. Warrick Parisian of critical care service who agrees to admit the patient and will initiate therapeutic hypothermia.  ECG shows some ST depression in the anterior leads but no evidence of STEMI.  Cardiology will be consulted.  ECG shows adequate placement of endotracheal tube and orogastric tube.  I have reviewed CT of the head and see no evidence of intracranial hemorrhage.  Of note, on review of past records, it is noted that he has had COVID-19 infection and also has received the COVID-19 vaccine.  Prior cardiac history includes history of nonischemic cardiomyopathy and atrial fibrillation.  He  also has a known 4 cm aneurysm of the ascending aorta.  Case has been discussed with Dr. Deforest Hoyles of cardiology service who agrees to see the patient in consultation.  CT of head  is read as no acute process per radiologist.  Labs show markedly elevated troponin consistent with non-STEMI.  Since he is already anticoagulated on apixaban, heparin is not initiated.  He will be given an aspirin suppository.  Labs also show markedly elevated lactic acid which is not unexpected given prolonged CPR.  He will be given additional IV fluid.  No evidence of sepsis.  Labs also show acute kidney injury and elevated liver function tests.  Final Clinical Impression(s) / ED Diagnoses Final diagnoses:  Cardiac arrest with ventricular fibrillation (HCC)  Chronic anticoagulation  Non-STEMI (non-ST elevated myocardial infarction) (HCC)  Acute kidney injury (nontraumatic) (HCC)  Elevated liver function tests  Elevated lactic acid level    Rx / DC Orders ED Discharge Orders    None       Dione BoozeGlick, Crecencio Kwiatek, MD 01/02/20 [phone]

## 2020-01-02 NOTE — Progress Notes (Signed)
CDS aware.  Spoke with [place].  Referral number:  [id number]

## 2020-01-02 NOTE — ED Notes (Signed)
Levo started at 3mcg/min per verbal order CCM.

## 2020-01-02 NOTE — Progress Notes (Signed)
ANTICOAGULATION CONSULT NOTE - Initial Consult  Pharmacy Consult for Heparin (Apixaban on hold) Indication: chest pain/ACS and atrial fibrillation  No Known Allergies  Patient Measurements: Height: 5\' 11"  (180.3 cm) Weight: 104.3 kg (230 lb) IBW/kg (Calculated) : 75.3  Vital Signs: Temp: 98.7 F (37.1 C) (08/28 0415) Temp Source: Core (08/27 2345) BP: 91/68 (08/28 0415) Pulse Rate: 110 (08/28 0400)  Labs: Recent Labs    January 29, 2020 2333 01-29-2020 2333 01/02/20 0031 01/02/20 0510  HGB 16.6   < > 17.0 18.7*  HCT 52.9*  --  50.0 55.0*  PLT 220  --   --   --   CREATININE 1.94*  --   --   --   TROPONINIHS 1,600*  --   --   --    < > = values in this interval not displayed.    Estimated Creatinine Clearance: 47.9 mL/min (A) (by C-G formula based on SCr of 1.94 mg/dL (H)).   Medical History: Past Medical History:  Diagnosis Date  . Atrial flutter (HCC)   . DCM (dilated cardiomyopathy) (HCC)    EF 35-40% years ago but echo a year ago showed normal LVF  . Dilated aortic root (HCC) 04/15/2015   36mm by echo 06/2017 and Chest CT 12/2016  . Hypertension   . Obesities, morbid (HCC)   . OSA (obstructive sleep apnea)   . Persistent atrial fibrillation [facility])    s/p TEE/DCCV now on Sotolol     Assessment: 64 y/o M with prolonged cardiac arrest s/p ROSC. Holding apixaban and starting heparin for afib/NSTEMI. Last apixaban dose was 8/27, but timing is unclear, will go ahead and start heparin now. CBC good. Noted bump in Scr. Will likely be using aPTT to dose for now.  Goal of Therapy:  Heparin level 0.3-0.7 units/ml aPTT 66-102 seconds Monitor platelets by anticoagulation protocol: Yes   Plan:  Start heparin drip at 1400 units/hr 1400 aPTT/heparin level Daily CBC/heparin level/aPTT Monitor for bleeding  9/27, PharmD, BCPS Clinical Pharmacist Phone: [phone]

## 2020-01-02 NOTE — Progress Notes (Signed)
Pharmacy Antibiotic Note  Colin [name] is a 64 y.o. male admitted on 12/22/2019 with s/p cardiac arrest.  Pharmacy has been consulted for Unasyn dosing for aspiration PNA. WBC elevated. Noted renal dysfunction.   Plan: Unasyn 3g IV q8h Trend WBC, temp, renal function  F/U infectious work-up   Height: 5\' 11"  (180.3 cm) Weight: 104.3 kg (230 lb) IBW/kg (Calculated) : 75.3  Temp (24hrs), Avg:98.3 F (36.8 C), Min:94.4 F (34.7 C), Max:98.9 F (37.2 C)  Recent Labs  Lab 01/05/2020 2333 01/02/20 0247  WBC 20.9*  --   CREATININE 1.94*  --   LATICACIDVEN 7.2* 6.8*    Estimated Creatinine Clearance: 47.9 mL/min (A) (by C-G formula based on SCr of 1.94 mg/dL (H)).    No Known Allergies  01/04/20, PharmD, BCPS Clinical Pharmacist Phone: [phone]

## 2020-01-02 NOTE — Progress Notes (Signed)
NAME:  Colin [name], MRN:  [id number], DOB:  1955-06-28, LOS: 0 ADMISSION DATE:  12/14/2019, CONSULTATION DATE:  01/02/20 REFERRING MD:  EDP, CHIEF COMPLAINT:  Cardiac arrest   Brief History   64 year old male past medical history of A. fib/a flutter on Eliquis, cardiomyopathy, hypertension, obesity, OSA who had a witnessed out of hospital V. fib arrest with 1 hour of CPR before ROSC obtained.  Intubated and PCCM consulted for admission  History of present illness   Colin [name] is a 64 year old male with past medical history of A. fib/a flutter s/p ablation on Eliquis, dilated aortic root cardiomyopathy, hypertension, obesity, OSA who has been in his usual state of health recently, though his wife noted he seemed to be breathing heavier and was more tired than usual for about the last day.  She says he never complains, just went to bed early.  This evening she was talking to him when he collapsed in front of her.  She called 911 and started chest compressions.  EMS found patient in V-fib, he was intubated and defibrillated multiple times, given amiodarone and a total of 7 mg of epi was obtained, estimated 1 hour of ACLS.  On arrival to the ED, patient was unresponsive, head CT without acute findings.  EKG with new anterior ST depressions and troponin 1,600.  Cardiology was consulted and confirmed no STEMI. blood work significant for lactic acidosis and leukocytosis with AKI and elevated LFTs.  Covid-19 negative no other source of infection noted.  PCCM consulted for admission  Past Medical History   has a past medical history of Atrial flutter (HCC), DCM (dilated cardiomyopathy) (HCC), Dilated aortic root (HCC) (04/15/2015), Hypertension, Obesities, morbid (HCC), OSA (obstructive sleep apnea), and Persistent atrial fibrillation (HCC).   Significant Hospital Events   8/28 Admit to PCCM  Consults:  cardiology  Procedures:  8/27 ETT  Significant Diagnostic Tests:  8/27 CXR>>Central vascular  congestion consistent with the recent CPR. 8/28 CT head>>Mild age-related atrophy and chronic microvascular ischemic changes. Old right occipital infarct.  Micro Data:  8/28 Sars-Cov-2>>negative  Antimicrobials:  Unasyn 8/28 >>   Interim history/subjective:   Labile blood pressure while on propofol and fentanyl, required escalating doses of norepinephrine prior to transfer ICU.  Sedation held temporarily with some improvement, norepinephrine subsequently weaned off High PEEP and FiO2 needs: PEEP 16, FiO2 1.00  Objective   Blood pressure (!) 72/59, pulse (!) 29, temperature 99.8 F (37.7 C), resp. rate 20, height 5\' 11"  (1.803 m), weight 104.3 kg, SpO2 91 %.    Vent Mode: PRVC FiO2 (%):  [100 %] 100 % Set Rate:  [16 bmp-20 bmp] 20 bmp Vt Set:  [530 mL-600 mL] 530 mL PEEP:  [8 cmH20-18 cmH20] 18 cmH20 Plateau Pressure:  [19 cmH20-33 cmH20] 33 cmH20   Intake/Output Summary (Last 24 hours) at 01/02/2020 1103 Last data filed at 01/02/2020 0900 Gross per 24 hour  Intake 112.84 ml  Output 30 ml  Net 82.84 ml   Filed Weights   12/06/2019 2333  Weight: 104.3 kg    General: Obese man, ill-appearing, intubated and ventilated HEENT: ET tube in place, oropharynx otherwise clear, pupils are pinpoint and unresponsive bilaterally Neuro: Does not wake to voice.  Is not breathing over the set rate.  Gag and corneals intact.  Pupils unresponsive CV: Tachycardic 110, regular PULM: Coarse bilaterally, no crackles or wheezes GI: Obese, nondistended with positive bowel sounds Extremities: No peripheral edema Skin: No rash   Resolved Hospital Problem list  Assessment & Plan:   Acute respiratory failure with hypoxemia, ventilator dependence ARDS physiology, suspect aspiration pneumonia Witnessed, out of hospital V. fib cardiac arrest 1 hour ACLS before ROSC, CT head negative Underlying cause unclear, possible non-STEMI.  Doubt PE since he is on chronic anticoagulation -Continue PRVC,  decreased tidal volumes to 6 cc/kg if/when acidosis will allow.  Increased respiratory rate 8/28 given persistent metabolic acidosis -Continue Unasyn for presumed aspiration event, aspiration pneumonia -High PEEP and high FiO2, wean as able -Sedation as per PAD protocol.  Currently poorly responsive, suspect anoxic brain injury due to prolonged arrest -Follow chest x-ray and ABG -VAP prevention order set -Defer Arctic sun, goal normothermia using antipyretics -EEG   NSTEMI, history hypertension, atrial fibrillation, cardiomyopathy Ventricular fibrillation, out of hospital cardiac arrest Last echocardiogram with EF 55-60%. Initial troponin 1,600, appreciate cardiology recommendations -Continue aspirin -Heparin infusion -Wean norepinephrine, goal to off.  Has PIV access, may require CVC if hemodynamics worsen  Acute kidney injury, ATN due to arrest -Follow BMP, urine output -No clear indication for HD at this time, question whether he will be a good candidate given the high likelihood that he has an ischemic brain injury.  Will need to consider nephrology consultation depending on course   Elevated LFTs, likely shock liver -Follow LFT, coags -Consider hepatitis work-up depending on trend with restoration adequate perfusion pressure   AGMA, lactic acidosis Likely secondary to prolonged cardiac arrest, no evidence of recent infection by history and no source on work-up thus far -Follow lactic acid for clearance -Follow renal function   Best practice:  Diet: N.p.o. Pain/Anxiety/Delirium protocol (if indicated): fentanyl VAP protocol (if indicated): HOB 30 degrees, suction as needed DVT prophylaxis: Heparin GI prophylaxis: Protonix Glucose control: SSI Mobility: Bedrest Code Status: Full code Family Communication: Patient's wife and multiple family members updated by phone in the ED Disposition: ICU  Labs   CBC: Recent Labs  Lab 2020/01/15 2333 01/02/20 0031 01/02/20 0440  01/02/20 0510 01/02/20 0912  WBC 20.9*  --  1.9*  --   --   NEUTROABS 11.0*  --   --   --   --   HGB 16.6 17.0 18.9* 18.7* 19.7*  HCT 52.9* 50.0 60.8* 55.0* 58.0*  MCV 88.5  --  88.5  --   --   PLT 220  --  243  --   --     Basic Metabolic Panel: Recent Labs  Lab 01-15-20 2333 01/15/20 2333 01/02/20 0031 01/02/20 0440 01/02/20 0510 01/02/20 0912 01/02/20 0922  NA 140   < > 139 137 138 140 137  K 4.2   < > 3.8 3.7 3.9 4.3 4.3  CL 104  --   --  101  --   --  105  CO2 19*  --   --  20*  --   --  18*  GLUCOSE 260*  --   --  264*  --   --  278*  BUN 15  --   --  19  --   --  24*  CREATININE 1.94*  --   --  2.40*  --   --  2.72*  CALCIUM 8.8*  --   --  8.9  --   --  8.7*  MG  --   --   --  2.4  --   --  2.2  PHOS  --   --   --  5.9*  --   --   --    < > =  values in this interval not displayed.   GFR: Estimated Creatinine Clearance: 34.2 mL/min (A) (by C-G formula based on SCr of 2.72 mg/dL (H)). Recent Labs  Lab 01-22-2020 2333 01/02/20 0247 01/02/20 0440 01/02/20 0923  WBC 20.9*  --  1.9*  --   LATICACIDVEN 7.2* 6.8*  --  3.9*    Liver Function Tests: Recent Labs  Lab 01/22/20 2333 01/02/20 0440  AST 198* 332*  ALT 207* 185*  ALKPHOS 132* 118  BILITOT 1.4* 1.2  PROT 6.9 6.4*  ALBUMIN 3.9 3.6   No results for input(s): LIPASE, AMYLASE in the last 168 hours. No results for input(s): AMMONIA in the last 168 hours.  ABG    Component Value Date/Time   PHART 7.084 (LL) 01/02/2020 0912   PCO2ART 73.6 (HH) 01/02/2020 0912   PO2ART 101 01/02/2020 0912   HCO3 21.8 01/02/2020 0912   TCO2 24 01/02/2020 0912   ACIDBASEDEF 10.0 (H) 01/02/2020 0912   O2SAT 94.0 01/02/2020 0912     Coagulation Profile: No results for input(s): INR, PROTIME in the last 168 hours.  Cardiac Enzymes: No results for input(s): CKTOTAL, CKMB, CKMBINDEX, TROPONINI in the last 168 hours.  HbA1C: Hgb A1c MFr Bld  Date/Time Value Ref Range Status  01/02/2020 04:40 AM 7.5 (H) 4.8 - 5.6 %  Final    Comment:    (NOTE) Pre diabetes:          5.7%-6.4%  Diabetes:              >6.4%  Glycemic control for   <7.0% adults with diabetes   12/17/2019 03:54 PM 7.9 (H) 4.8 - 5.6 % Final    Comment:             Prediabetes: 5.7 - 6.4          Diabetes: >6.4          Glycemic control for adults with diabetes: <7.0     CBG: Recent Labs  Lab 01/02/20 0205  GLUCAP 220*     Critical care time: 35 minutes     Levy Pupa, MD, PhD 01/02/2020, 11:14 AM [facility] [phone] or if no answer [phone]

## 2020-01-02 NOTE — Progress Notes (Signed)
Patient opening eyes and family with many questions and concerns. I reviewed information with the family and explained that the next steps would be to try and wean sedation and determine mental status. Family was unsure with their decision to be DNR and it was reversed. Dr Warrick Parisian verified with family.

## 2020-01-02 NOTE — Consult Note (Signed)
[facility] Consult Note   Primary Physician:  Dorothyann Peng, MD     Primary Cardiologist:  Armanda Magic, MD  Reason for Consultation: S/p cardiac arrest  HPI:    Colin [name] is a 64 year old gentleman with a past medical history significant for paroxysmal atrial flutter/fibrillation (on apixaban), dilated cardiomyopathy (EF 40% on echo, with subsequent normalization of LV function), stroke in 2018, dilated aortic root (~4cm), hypertension, type 2 diabetes mellitus, obstructive sleep apnea (on CPAP), gout, and morbid obesity, who was brought to EMS hospital after a witnessed arrest.  By EMS report, the wife saw the patient collapse at home.  CPR was initiated by the spouse.  EMS documented the rhythm to be VF. Shocked x 11, given epi x 8, 2g mag, 450mg  amio. Finally ROSC achieved.  Total amount of time spent with CPR was estimated about 1hour. Intubated in the field.  In the ED, initial post resuscitation ECG revealed ST depressions in the anterior leads, with trivial ST elevation in aVL.  A repeat electrocardiogram revealed normalization of the ST segments.  Patient was unresponsive but was noted to have spontaneous respirations.  He did require sedation initially with midazolam and fentanyl and later with propofol.  The chest x-ray showed central vascular congestion.  The lactic acid was 6.8.  CT of the head was negative for an intracranial bleed.  The high-sensitivity troponin was 1600.   STUDIES: 8/27 CXR>>Central vascular congestion consistent with the recent CPR. 8/28 CT head>>Mild age-related atrophy and chronic microvascular ischemic changes. Old right occipital infarct   .  Medications Prior to Admission medications   Medication Sig Start Date End Date Taking? Authorizing Provider  acetaminophen (TYLENOL) 500 MG tablet Take 1,000 mg by mouth every 6 (six) hours as needed for moderate pain or headache.   Yes [provider]  allopurinol (ZYLOPRIM) 100 MG tablet  Take 1 tablet (100 mg total) by mouth 2 (two) times daily. 08/13/19 08/12/20 Yes 10/12/20, MD  atorvastatin (LIPITOR) 80 MG tablet Take 1 tablet (80 mg total) by mouth daily. 10/06/19  Yes 12/06/19, MD  colchicine 0.6 MG tablet Take 1 tablet (0.6 mg total) by mouth daily as needed. colcrys Patient taking differently: Take 0.6 mg by mouth daily as needed (gout).  07/01/19  Yes 07/03/19, MD  ELIQUIS 5 MG TABS tablet TAKE 1 TABLET BY MOUTH TWICE DAILY Patient taking differently: Take 5 mg by mouth 2 (two) times daily.  07/28/19  Yes 07/30/19, NP  FARXIGA 5 MG TABS tablet TAKE 1 TABLET BY MOUTH DAILY Patient taking differently: Take 5 mg by mouth daily. Take 1 tablet by mouth daily 12/17/19  Yes 02/16/20, MD  levothyroxine Dorothyann Peng, LEVOTHROID) 200 MCG tablet Take 1 tablet by mouth Monday - Saturday and 1/2 tablet on Sunday Patient taking differently: Take 100-200 mcg by mouth See admin instructions. Take 200 mcg Monday through Saturday and 100 mcg on Sundays 06/04/18  Yes 06/06/18, MD  metoprolol tartrate (LOPRESSOR) 50 MG tablet TAKE 1 TABLET BY MOUTH TWICE DAILY, APPOINTMENT REQUIRED FOR FURTHER REFILLS Patient taking differently: Take 50 mg by mouth 2 (two) times daily.  12/22/19  Yes Turner, Traci R, MD  OZEMPIC, 0.25 OR 0.5 MG/DOSE, 2 MG/1.5ML SOPN INJECT 0.5 MG UNDER THE SKIN EVERY WEEK ON THE SAME DAY OF EACH WEEK, IN THE ABDOMINAL, THIGH, OR UPPER ARM ROTATING INJECTION SITES Patient taking differently: Inject 0.5 mg as directed every Thursday.  10/20/19  Yes Dorothyann PengSanders, Robyn, MD  TOUJEO SOLOSTAR 300 UNIT/ML Solostar Pen INJECT 40 UNITS INTO THE SKIN AS PER INSULIN PROTOCOL Patient taking differently: Inject 40 Units into the skin at bedtime.  08/24/19  Yes Dorothyann PengSanders, Robyn, MD  Vitamin D, Ergocalciferol, (DRISDOL) 1.25 MG (50000 UNIT) CAPS capsule One capsule po twice weekly on Tuesdays/Fridays Patient taking differently: Take 50,000 Units by mouth See admin  instructions. One capsule po twice weekly on Tuesdays/Fridays 12/12/19  Yes Dorothyann PengSanders, Robyn, MD  B-D ULTRAFINE III SHORT PEN 31G X 8 MM MISC USE AS DIRECTED WITH LEVEMIR PEN 06/08/19   Dorothyann PengSanders, Robyn, MD  sildenafil (VIAGRA) 100 MG tablet Take 1 tablet (100 mg total) by mouth daily as needed for erectile dysfunction. 09/09/19   Dorothyann PengSanders, Robyn, MD    Past Medical History: Past Medical History:  Diagnosis Date  . Atrial flutter (HCC)   . DCM (dilated cardiomyopathy) (HCC)    EF 35-40% years ago but echo a year ago showed normal LVF  . Dilated aortic root (HCC) 04/15/2015   42mm by echo 06/2017 and Chest CT 12/2016  . Hypertension   . Obesities, morbid (HCC)   . OSA (obstructive sleep apnea)   . Persistent atrial fibrillation [facility](HCC)    s/p TEE/DCCV now on Sotolol    Past Surgical History: Past Surgical History:  Procedure Laterality Date  . CARDIOVERSION N/A 09/24/2014   Procedure: CARDIOVERSION;  Surgeon: Quintella Reichertraci R Turner, MD;  [facility];  Service: Cardiovascular;  Laterality: N/A;  . CARDIOVERSION N/A 08/02/2017   Procedure: CARDIOVERSION;  Surgeon: Pricilla Riffleoss, Paula V, MD;  [facility];  Service: Cardiovascular;  Laterality: N/A;  . TEE WITHOUT CARDIOVERSION N/A 09/24/2014   Procedure: TRANSESOPHAGEAL ECHOCARDIOGRAM (TEE);  Surgeon: Quintella Reichertraci R Turner, MD;  [facility];  Service: Cardiovascular;  Laterality: N/A;    Family History: Family History  Problem Relation Age of Onset  . Stroke Mother   . Heart Problems Father        weak heart  . Heart Problems Paternal Grandfather        pacemaker    Social History: Social History   Socioeconomic History  . Marital status: Married    Spouse name: Not on file  . Number of children: Not on file  . Years of education: Not on file  . Highest education level: Not on file  Occupational History  . Not on file  Tobacco Use  . Smoking status: Former Smoker    Packs/day: 0.50    Years: 15.00    Pack years: 7.50     Types: Cigarettes  . Smokeless tobacco: Never Used  Vaping Use  . Vaping Use: Never used  Substance and Sexual Activity  . Alcohol use: No    Alcohol/week: 0.0 standard drinks  . Drug use: No  . Sexual activity: Not on file  Other Topics Concern  . Not on file  Social History Narrative  . Not on file   Social Determinants of Health   Financial Resource Strain:   . Difficulty of Paying Living Expenses: Not on file  Food Insecurity:   . Worried About Programme researcher, broadcasting/film/videounning Out of Food in the Last Year: Not on file  . Ran Out of Food in the Last Year: Not on file  Transportation Needs:   . Lack of Transportation (Medical): Not on file  . Lack of Transportation (Non-Medical): Not on file  Physical Activity:   . Days of Exercise per Week: Not on file  . Minutes of Exercise  per Session: Not on file  Stress:   . Feeling of Stress : Not on file  Social Connections:   . Frequency of Communication with Friends and Family: Not on file  . Frequency of Social Gatherings with Friends and Family: Not on file  . Attends Religious Services: Not on file  . Active Member of Clubs or Organizations: Not on file  . Attends Banker Meetings: Not on file  . Marital Status: Not on file    Allergies:  No Known Allergies   . Review of Systems: Unable to perform ROS.  Patient unresponsive.   Objective:    Vital Signs:   Temp:  [94.4 F (34.7 C)-98.9 F (37.2 C)] 98.4 F (36.9 C) (08/28 0300) Pulse Rate:  [49-118] 114 (08/28 0300) Resp:  [16-41] 35 (08/28 0300) BP: (85-121)/(59-91) 97/79 (08/28 0300) SpO2:  [79 %-95 %] 88 % (08/28 0300) FiO2 (%):  [100 %] 100 % (08/27 2340) Weight:  [104.3 kg] 104.3 kg (08/27 2333)    Weight change: Filed Weights   2020/01/24 2333  Weight: 104.3 kg    Intake/Output:  No intake or output data in the 24 hours ending 01/02/20 0307    Physical Exam    General:  Obese, intubated HEENT: Normocephalic, atraumatic Neck: supple. Normal JVP . Carotids  2+ bilat; no bruits. No lymphadenopathy or thyromegaly appreciated. Cor: Regular rate & rhythm. No rubs, gallops or murmurs. Lungs: Diminished breath sounds bilaterally Abdomen: soft,nondistended. No hepatosplenomegaly. No bruits or masses.  Extremities: no cyanosis, clubbing, rash, edema Neuro: Unresponsive    EKG    The ECG that was performed on 01-24-2020 that I reviewed personally showed sinus tachycardia, rate 107 bpm, poor R wave progression across the anterior precordial leads, mild elevation in aVL. ST depressions in multiple leads.  A repeat electrocardiogram revealed normalization of the ST segment's.  Labs   Basic Metabolic Panel: Recent Labs  Lab 2020/01/24 2333 01/02/20 0031  NA 140 139  K 4.2 3.8  CL 104  --   CO2 19*  --   GLUCOSE 260*  --   BUN 15  --   CREATININE 1.94*  --   CALCIUM 8.8*  --     Liver Function Tests: Recent Labs  Lab 2020-01-24 2333  AST 198*  ALT 207*  ALKPHOS 132*  BILITOT 1.4*  PROT 6.9  ALBUMIN 3.9   No results for input(s): LIPASE, AMYLASE in the last 168 hours. No results for input(s): AMMONIA in the last 168 hours.  CBC: Recent Labs  Lab 2020-01-24 2333 01/02/20 0031  WBC 20.9*  --   NEUTROABS 11.0*  --   HGB 16.6 17.0  HCT 52.9* 50.0  MCV 88.5  --   PLT 220  --     Cardiac Enzymes: No results for input(s): CKTOTAL, CKMB, CKMBINDEX, TROPONINI in the last 168 hours.  BNP: BNP (last 3 results) No results for input(s): BNP in the last 8760 hours.  ProBNP (last 3 results) No results for input(s): PROBNP in the last 8760 hours.   CBG: Recent Labs  Lab 01/02/20 0205  GLUCAP 220*    Coagulation Studies: No results for input(s): LABPROT, INR in the last 72 hours.   Imaging   CT Head Wo Contrast  Result Date: 01/02/2020 CLINICAL DATA:  64 year old male with syncope. EXAM: CT HEAD WITHOUT CONTRAST TECHNIQUE: Contiguous axial images were obtained from the base of the skull through the vertex without intravenous  contrast. COMPARISON:  None. FINDINGS: Brain: Mild  age-related atrophy and chronic microvascular ischemic changes. Old right occipital infarct. The false appears slightly prominent, possibly related to angulation. A trace parafalcine subdural hemorrhage is less likely. No other acute hemorrhage. No mass effect or midline shift. Vascular: High attenuation of the intracranial vasculature, likely hemoconcentration/dehydration. Skull: Normal. Negative for fracture or focal lesion. Sinuses/Orbits: Partial opacification of the sphenoid sinuses with air-fluid level. The mastoid air cells are clear. Other: An endotracheal and an enteric tube are partially visualized. IMPRESSION: 1. No acute intracranial pathology. 2. Mild age-related atrophy and chronic microvascular ischemic changes. Old right occipital infarct. Electronically Signed   By: Elgie Collard M.D.   On: 01/02/2020 00:51   DG Chest Port 1 View  Result Date: 01/02/2020 CLINICAL DATA:  Hypoxia EXAM: PORTABLE CHEST 1 VIEW COMPARISON:  12/11/2019 FINDINGS: Endotracheal tube tip is about 3.9 cm superior to the carina. Esophageal tube tip is below the diaphragm but incompletely visualized. Cardiomegaly. Extensive bilateral hazy pulmonary opacity with worsening consolidations. No pneumothorax. IMPRESSION: 1. Endotracheal tube tip about 3.9 cm superior to carina 2. Cardiomegaly 3. Interval worsening of diffuse bilateral hazy pulmonary opacity and patchy bilateral lung consolidations potentially representing edema or bilateral pneumonia. Electronically Signed   By: Jasmine Pang M.D.   On: 01/02/2020 02:52   DG Chest Portable 1 View  Result Date: 12/08/2019 CLINICAL DATA:  Status post CPR and intubation EXAM: PORTABLE CHEST 1 VIEW COMPARISON:  None. FINDINGS: Cardiac shadow is enlarged. Endotracheal tube and gastric catheter are noted in satisfactory position. Diffuse central vascular congestion is noted without significant edema. No definitive rib fractures  are pneumothorax is identified. IMPRESSION: Tubes and lines as described. Central vascular congestion consistent with the recent CPR. Electronically Signed   By: Alcide Clever M.D.   On: 12/19/2019 23:57      Medications:     Current Medications: . acetaminophen  650 mg Oral Q4H   Or  . acetaminophen (TYLENOL) oral liquid 160 mg/5 mL  650 mg Per Tube Q4H   Or  . acetaminophen  650 mg Rectal Q4H  . apixaban  5 mg Oral BID  . [START ON 01/03/2020] aspirin EC  325 mg Oral Daily  . atorvastatin  80 mg Oral Daily  . fentaNYL      . insulin aspart  0-15 Units Subcutaneous Q4H  . levothyroxine  200 mcg Oral Once per day on Mon Tue Wed Thu Fri Sat   And  . [START ON 01/03/2020] levothyroxine  100 mcg Oral Once per day on Sun  . midazolam      . pantoprazole (PROTONIX) IV  40 mg Intravenous Daily     Infusions: . fentaNYL infusion INTRAVENOUS 75 mcg/hr (01/02/20 0251)  . norepinephrine    . propofol (DIPRIVAN) infusion 5 mcg/kg/min (01/02/20 0250)       Assessment/Plan   1. S/p cardiac arrest Patient had a witnessed arrest.  He underwent approximately 1 hour of CPR and was shocked multiple times for VF. ROSC was achieved.  He was intubated by EMS.  Current neuro status is unclear.  He is on sedation and vented.  -Agree with targeted temperature management (TTM) -Should consider ischemic work-up once TTM protocol completed  2. Elevated troponin The patient's initial rhythm was VF, that was treated successfully.  The earlier ECG postarrest did reveal transient ST depressions in the anterior leads with less than 0.5 mm ST elevation in aVL.  A subsequent EKG did not show these changes.  -Aspirin daily  -Unclear when patient took  last dose of apixaban.  Consider IV heparin once safe to start. -Trend cardiac enzymes -Serial ECGs -High intensity statins -Transthoracic echocardiogram to evaluate systolic and diastolic LV function.  3. Paroxysmal atrial flutter/fibrillation The  patient is currently in sinus rhythm.  CHA2DS2-VASc score is 5+.  Continue anticoagulation.    Lonie Peak, MD  01/02/2020, 3:07 AM  Cardiology Overnight Team Please contact [facility] for night-coverage after hours (4p -7a ) and weekends on [email or web address]

## 2020-01-02 NOTE — ED Notes (Signed)
Pt desat to 77% on the vent, suctioned by respiratory with no change, CCM aware.

## 2020-01-02 NOTE — ED Provider Notes (Signed)
Called to see patient because of hypoxia, increased airway pressures, and patient fighting ventilator.  He had been taken off of midazolam and fentanyl and started on propofol for sedation.  He is tachypneic but not moving much air.  Some secretions are present suggestive of pulmonary edema.  We will repeat chest x-ray to look for signs of pulmonary edema and rule out pneumothorax. Events relayed to Dr. Isabella Bowens by secure chat.   Dione Booze, MD 01/02/20 [phone]

## 2020-01-03 ENCOUNTER — Inpatient Hospital Stay (HOSPITAL_COMMUNITY): Payer: Commercial Managed Care - PPO

## 2020-01-03 DIAGNOSIS — I469 Cardiac arrest, cause unspecified: Secondary | ICD-10-CM

## 2020-01-03 DIAGNOSIS — R569 Unspecified convulsions: Secondary | ICD-10-CM

## 2020-01-03 DIAGNOSIS — J9601 Acute respiratory failure with hypoxia: Secondary | ICD-10-CM

## 2020-01-03 DIAGNOSIS — I4901 Ventricular fibrillation: Secondary | ICD-10-CM

## 2020-01-03 LAB — BASIC METABOLIC PANEL
Anion gap: 12 (ref 5–15)
Anion gap: 13 (ref 5–15)
Anion gap: 14 (ref 5–15)
BUN: 32 mg/dL — ABNORMAL HIGH (ref 8–23)
BUN: 34 mg/dL — ABNORMAL HIGH (ref 8–23)
BUN: 34 mg/dL — ABNORMAL HIGH (ref 8–23)
CO2: 19 mmol/L — ABNORMAL LOW (ref 22–32)
CO2: 20 mmol/L — ABNORMAL LOW (ref 22–32)
CO2: 25 mmol/L (ref 22–32)
Calcium: 7.9 mg/dL — ABNORMAL LOW (ref 8.9–10.3)
Calcium: 8 mg/dL — ABNORMAL LOW (ref 8.9–10.3)
Calcium: 8 mg/dL — ABNORMAL LOW (ref 8.9–10.3)
Chloride: 102 mmol/L (ref 98–111)
Chloride: 103 mmol/L (ref 98–111)
Chloride: 104 mmol/L (ref 98–111)
Creatinine, Ser: 2.43 mg/dL — ABNORMAL HIGH (ref 0.61–1.24)
Creatinine, Ser: 3.06 mg/dL — ABNORMAL HIGH (ref 0.61–1.24)
Creatinine, Ser: 3.21 mg/dL — ABNORMAL HIGH (ref 0.61–1.24)
GFR calc Af Amer: 23 mL/min — ABNORMAL LOW (ref >60)
GFR calc Af Amer: 24 mL/min — ABNORMAL LOW (ref >60)
GFR calc Af Amer: 32 mL/min — ABNORMAL LOW (ref >60)
GFR calc non Af Amer: 19 mL/min — ABNORMAL LOW (ref >60)
GFR calc non Af Amer: 21 mL/min — ABNORMAL LOW (ref >60)
GFR calc non Af Amer: 27 mL/min — ABNORMAL LOW (ref >60)
Glucose, Bld: 147 mg/dL — ABNORMAL HIGH (ref 70–99)
Glucose, Bld: 286 mg/dL — ABNORMAL HIGH (ref 70–99)
Glucose, Bld: 319 mg/dL — ABNORMAL HIGH (ref 70–99)
Potassium: 4.2 mmol/L (ref 3.5–5.1)
Potassium: 4.4 mmol/L (ref 3.5–5.1)
Potassium: 4.9 mmol/L (ref 3.5–5.1)
Sodium: 136 mmol/L (ref 135–145)
Sodium: 136 mmol/L (ref 135–145)
Sodium: 140 mmol/L (ref 135–145)

## 2020-01-03 LAB — GLUCOSE, CAPILLARY
Glucose-Capillary: 239 mg/dL — ABNORMAL HIGH (ref 70–99)
Glucose-Capillary: 226 mg/dL — ABNORMAL HIGH (ref 70–99)
Glucose-Capillary: 188 mg/dL — ABNORMAL HIGH (ref 70–99)
Glucose-Capillary: 150 mg/dL — ABNORMAL HIGH (ref 70–99)
Glucose-Capillary: 134 mg/dL — ABNORMAL HIGH (ref 70–99)

## 2020-01-03 LAB — POCT I-STAT 7, (LYTES, BLD GAS, ICA,H+H)
Acid-base deficit: 4 mmol/L — ABNORMAL HIGH (ref 0.0–2.0)
Bicarbonate: 20.6 mmol/L (ref 20.0–28.0)
Calcium, Ion: 1.09 mmol/L — ABNORMAL LOW (ref 1.15–1.40)
HCT: 50 % (ref 39.0–52.0)
Hemoglobin: 17 g/dL (ref 13.0–17.0)
O2 Saturation: 98 %
Patient temperature: 99.6
Potassium: 4.5 mmol/L (ref 3.5–5.1)
Sodium: 139 mmol/L (ref 135–145)
TCO2: 22 mmol/L (ref 22–32)
pCO2 arterial: 38.5 mmHg (ref 32.0–48.0)
pH, Arterial: 7.339 — ABNORMAL LOW (ref 7.350–7.450)
pO2, Arterial: 111 mmHg — ABNORMAL HIGH (ref 83.0–108.0)

## 2020-01-03 LAB — CBC
HCT: 49.5 % (ref 39.0–52.0)
Hemoglobin: 16.1 g/dL (ref 13.0–17.0)
MCH: 27.1 pg (ref 26.0–34.0)
MCHC: 32.5 g/dL (ref 30.0–36.0)
MCV: 83.2 fL (ref 80.0–100.0)
Platelets: 131 10*3/uL — ABNORMAL LOW (ref 150–400)
RBC: 5.95 MIL/uL — ABNORMAL HIGH (ref 4.22–5.81)
RDW: 15.9 % — ABNORMAL HIGH (ref 11.5–15.5)
WBC: 6 10*3/uL (ref 4.0–10.5)
nRBC: 0 % (ref 0.0–0.2)

## 2020-01-03 LAB — HEPATIC FUNCTION PANEL
ALT: 142 U/L — ABNORMAL HIGH (ref 0–44)
AST: 339 U/L — ABNORMAL HIGH (ref 15–41)
Albumin: 3 g/dL — ABNORMAL LOW (ref 3.5–5.0)
Alkaline Phosphatase: 71 U/L (ref 38–126)
Bilirubin, Direct: 0.4 mg/dL — ABNORMAL HIGH (ref 0.0–0.2)
Indirect Bilirubin: 1 mg/dL — ABNORMAL HIGH (ref 0.3–0.9)
Total Bilirubin: 1.4 mg/dL — ABNORMAL HIGH (ref 0.3–1.2)
Total Protein: 5.9 g/dL — ABNORMAL LOW (ref 6.5–8.1)

## 2020-01-03 LAB — APTT
aPTT: 41 seconds — ABNORMAL HIGH (ref 24–36)
aPTT: 142 seconds — ABNORMAL HIGH (ref 24–36)

## 2020-01-03 LAB — HEPARIN LEVEL (UNFRACTIONATED)
Heparin Unfractionated: 0.14 IU/mL — ABNORMAL LOW (ref 0.30–0.70)
Heparin Unfractionated: 0.37 IU/mL (ref 0.30–0.70)

## 2020-01-03 LAB — MAGNESIUM
Magnesium: 1.7 mg/dL (ref 1.7–2.4)
Magnesium: 2.4 mg/dL (ref 1.7–2.4)

## 2020-01-03 LAB — TRIGLYCERIDES: Triglycerides: 112 mg/dL (ref <150)

## 2020-01-03 MED ORDER — ORAL CARE MOUTH RINSE
15.0000 mL | OROMUCOSAL | Status: DC
  Administered 2020-01-03 – 2020-01-06 (×29): 15 mL via OROMUCOSAL

## 2020-01-03 MED ORDER — VASOPRESSIN 20 UNITS/100 ML INFUSION FOR SHOCK
0.0000 [IU]/min | INTRAVENOUS | Status: DC
  Filled 2020-01-03: qty 100

## 2020-01-03 MED ORDER — CHLORHEXIDINE GLUCONATE 0.12% ORAL RINSE (MEDLINE KIT)
15.0000 mL | Freq: Two times a day (BID) | OROMUCOSAL | Status: DC
  Administered 2020-01-03 – 2020-01-06 (×6): 15 mL via OROMUCOSAL

## 2020-01-03 MED ORDER — MAGNESIUM SULFATE 4 GM/100ML IV SOLN
4.0000 g | Freq: Once | INTRAVENOUS | Status: AC
  Administered 2020-01-03: 4 g via INTRAVENOUS
  Filled 2020-01-03: qty 100

## 2020-01-03 MED ORDER — SODIUM CHLORIDE 0.9% FLUSH
10.0000 mL | Freq: Two times a day (BID) | INTRAVENOUS | Status: DC
  Administered 2020-01-04 – 2020-01-09 (×10): 10 mL

## 2020-01-03 MED ORDER — HEPARIN (PORCINE) 25000 UT/250ML-% IV SOLN: 1400.0000 [IU]/h | INTRAVENOUS | Status: DC

## 2020-01-03 MED ORDER — SODIUM CHLORIDE 0.9% FLUSH: 10.0000 mL | INTRAVENOUS | Status: DC | PRN

## 2020-01-03 MED ORDER — HEPARIN (PORCINE) 25000 UT/250ML-% IV SOLN
1300.0000 [IU]/h | INTRAVENOUS | Status: DC
  Administered 2020-01-03 (×2): 1400 [IU]/h via INTRAVENOUS
  Administered 2020-01-04 – 2020-01-06 (×2): 1300 [IU]/h via INTRAVENOUS
  Filled 2020-01-03 (×4): qty 250

## 2020-01-03 NOTE — Progress Notes (Signed)
eLink Physician-Brief Progress Note Patient Name: Colin [name] DOB: 05-21-1955 MRN: [id number]   Date of Service  01/03/2020  HPI/Events of Note  Hypotension  On Levophed running via IO and at ceiling.  eICU Interventions  Vasopressin added, ground crew requested to insert a central line.        Thomasene Lot Annetta Deiss 01/03/2020, 3:59 AM

## 2020-01-03 NOTE — Progress Notes (Signed)
EEG complete - results pending 

## 2020-01-03 NOTE — Progress Notes (Addendum)
NAME:  Colin [name], MRN:  [id number], DOB:  1955-11-08, LOS: 1 ADMISSION DATE:  18-Jan-2020, CONSULTATION DATE:  01/03/20 REFERRING MD:  EDP, CHIEF COMPLAINT:  Cardiac arrest   Brief History   64 year old male past medical history of A. fib/a flutter on Eliquis, cardiomyopathy, hypertension, obesity, OSA who had a witnessed out of hospital V. fib arrest with 1 hour of CPR before ROSC obtained.  Intubated and PCCM consulted for admission  History of present illness   Mr. Colin [name] is a 64 year old male with past medical history of A. fib/a flutter s/p ablation on Eliquis, dilated aortic root cardiomyopathy, hypertension, obesity, OSA who has been in his usual state of health recently, though his wife noted he seemed to be breathing heavier and was more tired than usual for about the last day.  She says he never complains, just went to bed early.  This evening she was talking to him when he collapsed in front of her.  She called 911 and started chest compressions.  EMS found patient in V-fib, he was intubated and defibrillated multiple times, given amiodarone and a total of 7 mg of epi was obtained, estimated 1 hour of ACLS.  On arrival to the ED, patient was unresponsive, head CT without acute findings.  EKG with new anterior ST depressions and troponin 1,600.  Cardiology was consulted and confirmed no STEMI. blood work significant for lactic acidosis and leukocytosis with AKI and elevated LFTs.  Covid-19 negative no other source of infection noted.  PCCM consulted for admission  Past Medical History   has a past medical history of Atrial flutter (HCC), DCM (dilated cardiomyopathy) (HCC), Dilated aortic root (HCC) (04/15/2015), Hypertension, Obesities, morbid (HCC), OSA (obstructive sleep apnea), and Persistent atrial fibrillation (HCC).   Significant Hospital Events   8/28 Admit to PCCM  Consults:  cardiology  Procedures:  8/27 ETT  Significant Diagnostic Tests:  8/27 CXR>>Central vascular  congestion consistent with the recent CPR. 8/28 CT head>>Mild age-related atrophy and chronic microvascular ischemic changes. Old right occipital infarct.  Micro Data:  8/28 Sars-Cov-2>>negative  Antimicrobials:  Unasyn 8/28 >>   Interim history/subjective:   We initially placed ceiling on his norepinephrine given and presumed for prognosis, change CODE STATUS to DNR in the event of decompensation.  He started to open eyes, has some spontaneous movement of his upper extremity and family requested that we continue to escalate support to ensure stability, change CODE STATUS to full code.  Objective   Blood pressure 128/81, pulse 95, temperature 99.7 F (37.6 C), resp. rate 15, height 5\' 11"  (1.803 m), weight 104.3 kg, SpO2 95 %.    Vent Mode: PRVC FiO2 (%):  [93 %-100 %] 100 % Set Rate:  [20 bmp] 20 bmp Vt Set:  [530 mL] 530 mL PEEP:  [18 cmH20] 18 cmH20 Plateau Pressure:  [30 cmH20-32 cmH20] 30 cmH20   Intake/Output Summary (Last 24 hours) at 01/03/2020 0759 Last data filed at 01/03/2020 0000 Gross per 24 hour  Intake 1722.61 ml  Output 325 ml  Net 1397.61 ml   Filed Weights   01/18/2020 2333  Weight: 104.3 kg    General: Obese man, ventilated, ill-appearing HEENT: ET tube in good position, oropharynx otherwise clear, minimal secretions, pupils are 2 to 3 mm and react sluggishly Neuro: Did not spontaneously open eyes, move upper extremities (propofol 10) CV: Regular, no murmur PULM: Coarse bilateral breath sounds with inspiratory crackles, no wheezing GI: Obese, nondistended, hypoactive bowel sounds present Extremities: Trace pretibial edema Skin:  No rash   Resolved Hospital Problem list     Assessment & Plan:   Acute respiratory failure with hypoxemia, ventilator dependence ARDS physiology, suspect aspiration pneumonia Witnessed, out of hospital V. fib cardiac arrest 1 hour ACLS before ROSC, CT head negative Underlying cause unclear, possible non-STEMI.  Doubt PE  since he is on chronic anticoagulation -PRVC, currently 8 cc/kg with high PEEP and high FiO2 needs 1.00, PEEP 18.  Goal decrease VT to 6 cc/kg when ventilation will allow.  Wean PEEP to 16 on 8/29 and follow -Empiric Unasyn -Sedation as per PAD protocol -Follow chest x-ray, ABG -VAP prevention order set -Attempt to maintain normothermia, formal TTM deferred -EEG  NSTEMI, history hypertension, atrial fibrillation, cardiomyopathy Ventricular fibrillation, out of hospital cardiac arrest Last echocardiogram with EF 55-60%. Initial troponin 1,600, appreciate cardiology recommendations -Continue aspirin, heparin discontinued on 8/28 plan to restart on 8/29 -Remains on norepinephrine, CVC placed early 8/29 -Echocardiogram pending on 8/29  Acute kidney injury, ATN due to arrest, nonoliguric 8/29 -Follow renal failure, urine output, BMP.  At high risk for ATN -Continue current level of support and follow for renal recovery.  He is a poor candidate for hemodialysis given his other presumed endorgan injuries.  We will consider nephrology consultation depending on course  Elevated LFTs, likely shock liver -Follow coags, LFT Consider hepatitis work-up depending on trend  AGMA, lactic acidosis Likely secondary to prolonged cardiac arrest, no evidence of recent infection by history and no source on work-up thus far -Follow lactic acid for clearance -Follow renal function -Decrease sodium bicarbonate infusion, hopefully to off in the next 24 hours   Best practice:  Diet: N.p.o. Pain/Anxiety/Delirium protocol (if indicated): fentanyl VAP protocol (if indicated): HOB 30 degrees, suction as needed DVT prophylaxis: Heparin GI prophylaxis: Protonix Glucose control: SSI Mobility: Bedrest Code Status: Full code Family Communication: Discussed status with the patient's wife 8/28 and 8/29 Disposition: ICU  Labs   CBC: Recent Labs  Lab 12/06/2019 2333 01/02/20 0031 01/02/20 0440 01/02/20 0440  01/02/20 0510 01/02/20 0912 01/02/20 0922 01/03/20 0347 01/03/20 0357  WBC 20.9*  --  1.9*  --   --   --  1.5* 6.0  --   NEUTROABS 11.0*  --   --   --   --   --   --   --   --   HGB 16.6   < > 18.9*   < > 18.7* 19.7* 18.5* 16.1 17.0  HCT 52.9*   < > 60.8*   < > 55.0* 58.0* 58.5* 49.5 50.0  MCV 88.5  --  88.5  --   --   --  87.2 83.2  --   PLT 220  --  243  --   --   --  187 131*  --    < > = values in this interval not displayed.    Basic Metabolic Panel: Recent Labs  Lab 12/09/2019 2333 01/02/20 0031 01/02/20 0440 01/02/20 0510 01/02/20 0912 01/02/20 2440 01/02/20 2357 01/03/20 0347 01/03/20 0357  NA 140   < > 137   < > 140 137 136 136 139  K 4.2   < > 3.7   < > 4.3 4.3 4.9 4.4 4.5  CL 104  --  101  --   --  105 103 104  --   CO2 19*  --  20*  --   --  18* 19* 20*  --   GLUCOSE 260*  --  264*  --   --  278* 319* 286*  --   BUN 15  --  19  --   --  24* 34* 34*  --   CREATININE 1.94*  --  2.40*  --   --  2.72* 3.21* 3.06*  --   CALCIUM 8.8*  --  8.9  --   --  8.7* 8.0* 7.9*  --   MG  --   --  2.4  --   --  2.2  --  1.7  --   PHOS  --   --  5.9*  --   --   --   --   --   --    < > = values in this interval not displayed.   GFR: Estimated Creatinine Clearance: 30.4 mL/min (A) (by C-G formula based on SCr of 3.06 mg/dL (H)). Recent Labs  Lab 12/31/2019 2333 01/02/20 0247 01/02/20 0440 01/02/20 0922 01/02/20 0923 01/03/20 0347  WBC 20.9*  --  1.9* 1.5*  --  6.0  LATICACIDVEN 7.2* 6.8*  --   --  3.9*  --     Liver Function Tests: Recent Labs  Lab 12/20/2019 2333 01/02/20 0440 01/03/20 0347  AST 198* 332* 339*  ALT 207* 185* 142*  ALKPHOS 132* 118 71  BILITOT 1.4* 1.2 1.4*  PROT 6.9 6.4* 5.9*  ALBUMIN 3.9 3.6 3.0*   No results for input(s): LIPASE, AMYLASE in the last 168 hours. No results for input(s): AMMONIA in the last 168 hours.  ABG    Component Value Date/Time   PHART 7.339 (L) 01/03/2020 0357   PCO2ART 38.5 01/03/2020 0357   PO2ART 111 (H)  01/03/2020 0357   HCO3 20.6 01/03/2020 0357   TCO2 22 01/03/2020 0357   ACIDBASEDEF 4.0 (H) 01/03/2020 0357   O2SAT 98.0 01/03/2020 0357     Coagulation Profile: No results for input(s): INR, PROTIME in the last 168 hours.  Cardiac Enzymes: No results for input(s): CKTOTAL, CKMB, CKMBINDEX, TROPONINI in the last 168 hours.  HbA1C: Hgb A1c MFr Bld  Date/Time Value Ref Range Status  01/02/2020 04:40 AM 7.5 (H) 4.8 - 5.6 % Final    Comment:    (NOTE) Pre diabetes:          5.7%-6.4%  Diabetes:              >6.4%  Glycemic control for   <7.0% adults with diabetes   12/17/2019 03:54 PM 7.9 (H) 4.8 - 5.6 % Final    Comment:             Prediabetes: 5.7 - 6.4          Diabetes: >6.4          Glycemic control for adults with diabetes: <7.0     CBG: Recent Labs  Lab 01/02/20 0205 01/02/20 1113 01/02/20 2258 01/03/20 0344 01/03/20 0733  GLUCAP 220* 320* 321* 239* 226*     Critical care time: 34 minutes     Levy Pupa, MD, PhD 01/03/2020, 7:59 AM [facility] [phone] or if no answer [phone]

## 2020-01-03 NOTE — Progress Notes (Signed)
Neuro: Pt been off all sedation for most of the day and unable to follow commands at this time. Pt gripped hand on command once in shift but unable to repeat with any additional assessments. Pt does withdraw to pain. Will continue to monitor.    Respiratory: Pt remains on ventilator at this time with vent settings of PRVC with fio2 at 60%, peep of 16, rate of 28, and Vt of 530. O2 sats WNL. Pts lungs clear and diminished with minimal secretions .  Cardiovascular: Pt remains in sinus rhythm with frequent PVCs and several small runs of Vtach. BP elevated and vasopressors have been off for most of the day. Pulses palpable strong throughout.   GI/GU: Pt with foley in place with adequate urine output. Pt with of urine output throughout shift. Last BM was PTA. Pt with OG tube in place to LIWS.   Skin: Skin intact with no s/s of skin breakdown at this time and continues to be a Q2hr turn.   Pain:  Fentanyl restarted due to pts RR increasing and maintaining in 40's.   Events: NO acute events throughout shift. Pts plan of care to continue with current regimen, Family updated and no further questions at this time.

## 2020-01-03 NOTE — Progress Notes (Addendum)
ANTICOAGULATION CONSULT NOTE   Pharmacy Consult for Heparin (Apixaban on hold) Indication: chest pain/ACS and atrial fibrillation  No Known Allergies  Patient Measurements: Height: 5\' 11"  (180.3 cm) Weight: 104.3 kg (230 lb) IBW/kg (Calculated) : 75.3  Vital Signs: Temp: 99.3 F (37.4 C) (08/29 0700) Temp Source: Bladder (08/29 0400) BP: 128/81 (08/29 0300) Pulse Rate: 93 (08/29 0700)  Labs: Recent Labs    12/10/2019 2333 01/02/20 0031 01/02/20 0440 01/02/20 0510 01/02/20 0922 01/02/20 0922 01/02/20 2357 01/03/20 0347 01/03/20 0357  HGB 16.6   < > 18.9*   < > 18.5*   < >  --  16.1 17.0  HCT 52.9*   < > 60.8*   < > 58.5*  --   --  49.5 50.0  PLT 220   < > 243  --  187  --   --  131*  --   CREATININE 1.94*   < > 2.40*   < > 2.72*  --  3.21* 3.06*  --   TROPONINIHS 1,600*  --  >27,000*  --   --   --   --   --   --    < > = values in this interval not displayed.    Estimated Creatinine Clearance: 30.4 mL/min (A) (by C-G formula based on SCr of 3.06 mg/dL (H)).   Medical History: Past Medical History:  Diagnosis Date  . Atrial flutter (HCC)   . DCM (dilated cardiomyopathy) (HCC)    EF 35-40% years ago but echo a year ago showed normal LVF  . Dilated aortic root (HCC) 04/15/2015   63mm by echo 06/2017 and Chest CT 12/2016  . Hypertension   . Obesities, morbid (HCC)   . OSA (obstructive sleep apnea)   . Persistent atrial fibrillation [facility])    s/p TEE/DCCV now on Sotolol     Assessment: 63 y/o M with prolonged cardiac arrest s/p ROSC. Holding apixaban and starting heparin for afib/NSTEMI. Last apixaban dose was 8/27, but timing is unclear.  Heparin started yesterday AM, then stopped for central line placement.  Kept off overnight since plans for continued care were unclear.  Patient is now full-code and pursuing aggressive treatment.  Discussed with Dr. 9/27, will restart IV heparin now.  Goal of Therapy:  Heparin level 0.3-0.7 units/ml aPTT 66-102  seconds Monitor platelets by anticoagulation protocol: Yes   Plan:  Restart heparin drip at 1400 units/hr Check aPTT/Heparin level in 8 hrs. Daily CBC/heparin level/aPTT Monitor for bleeding  Diona Browner, Reece Leader, [place] Clinical Pharmacist  01/03/2020 8:18 AM   [facility] pharmacy phone numbers are listed on [email or web address]

## 2020-01-03 NOTE — Progress Notes (Signed)
Progress Note  Patient Name: Colin [name] Date of Encounter: 01/03/2020  Primary Cardiologist: Armanda Magic, MD  Subjective   Patient remains intubated on ventilator with full supportive measures.  Not responsive this morning.  Reportedly had some eye movements overnight.  Inpatient Medications    Scheduled Meds: . acetaminophen  650 mg Oral Q4H   Or  . acetaminophen (TYLENOL) oral liquid 160 mg/5 mL  650 mg Per Tube Q4H   Or  . acetaminophen  650 mg Rectal Q4H  . aspirin EC  325 mg Oral Daily  . atorvastatin  80 mg Oral Daily  . Chlorhexidine Gluconate Cloth  6 each Topical Daily  . insulin aspart  0-15 Units Subcutaneous Q4H  . levothyroxine  200 mcg Oral Once per day on Mon Tue Wed Thu Fri Sat   And  . levothyroxine  100 mcg Oral Once per day on Sun  . pantoprazole (PROTONIX) IV  40 mg Intravenous Daily   Continuous Infusions: . sodium chloride    . ampicillin-sulbactam (UNASYN) IV 3 g (01/03/20 0625)  . fentaNYL infusion INTRAVENOUS 100 mcg/hr (01/02/20 2310)  . norepinephrine (LEVOPHED) Adult infusion 16 mcg/min (01/03/20 0537)  . propofol (DIPRIVAN) infusion 2 mcg/kg/min (01/02/20 2310)  . sodium bicarbonate (isotonic) 150 mEq in D5W 1000 mL infusion 75 mL/hr at 01/02/20 2310  . vasopressin     PRN Meds: docusate sodium, polyethylene glycol   Vital Signs    Vitals:   01/03/20 0300 01/03/20 0315 01/03/20 0330 01/03/20 0345  BP: 128/81     Pulse: 95 93 93 95  Resp:      Temp: 99.7 F (37.6 C) 99.7 F (37.6 C) 99.7 F (37.6 C) 99.7 F (37.6 C)  TempSrc:      SpO2: 96% 96% 96% 95%  Weight:      Height:        Intake/Output Summary (Last 24 hours) at 01/03/2020 0745 Last data filed at 01/03/2020 0000 Gross per 24 hour  Intake 1722.61 ml  Output 325 ml  Net 1397.61 ml   Filed Weights   01-02-2020 2333  Weight: 104.3 kg    Telemetry    Sinus rhythm, sinus tachycardia.  Personally reviewed.  ECG    An ECG dated 01/03/2020 was personally  reviewed today and demonstrated:  Sinus rhythm with PAC, poor R wave progression, left anterior fascicular block, nonspecific T wave changes.  Physical Exam   GEN: No acute distress.   Neck: No JVD. Cardiac: RRR, no murmur.  Respiratory:  Clear anteriorly, no crackles. GI: Soft, nontender, bowel sounds diminished. MS: No edema; No deformity. Neuro:   No obvious purposeful movements at this time.  Labs    Chemistry Recent Labs  Lab 2020/01/02 2333 01/02/20 0031 01/02/20 0440 01/02/20 0510 01/02/20 8101 01/02/20 0922 01/02/20 2357 01/03/20 0347 01/03/20 0357  NA 140   < > 137   < > 137   < > 136 136 139  K 4.2   < > 3.7   < > 4.3   < > 4.9 4.4 4.5  CL 104   < > 101   < > 105  --  103 104  --   CO2 19*   < > 20*   < > 18*  --  19* 20*  --   GLUCOSE 260*   < > 264*   < > 278*  --  319* 286*  --   BUN 15   < > 19   < >  24*  --  34* 34*  --   CREATININE 1.94*   < > 2.40*   < > 2.72*  --  3.21* 3.06*  --   CALCIUM 8.8*   < > 8.9   < > 8.7*  --  8.0* 7.9*  --   PROT 6.9  --  6.4*  --   --   --   --  5.9*  --   ALBUMIN 3.9  --  3.6  --   --   --   --  3.0*  --   AST 198*  --  332*  --   --   --   --  339*  --   ALT 207*  --  185*  --   --   --   --  142*  --   ALKPHOS 132*  --  118  --   --   --   --  71  --   BILITOT 1.4*  --  1.2  --   --   --   --  1.4*  --   GFRNONAA 36*   < > 28*   < > 24*  --  19* 21*  --   GFRAA 41*   < > 32*   < > 28*  --  23* 24*  --   ANIONGAP 17*   < > 16*   < > 14  --  14 12  --    < > = values in this interval not displayed.     Hematology Recent Labs  Lab 01/02/20 0440 01/02/20 0510 01/02/20 0922 01/03/20 0347 01/03/20 0357  WBC 1.9*  --  1.5* 6.0  --   RBC 6.87*  --  6.71* 5.95*  --   HGB 18.9*   < > 18.5* 16.1 17.0  HCT 60.8*   < > 58.5* 49.5 50.0  MCV 88.5  --  87.2 83.2  --   MCH 27.5  --  27.6 27.1  --   MCHC 31.1  --  31.6 32.5  --   RDW 16.5*  --  16.5* 15.9*  --   PLT 243  --  187 131*  --    < > = values in this interval not  displayed.    Cardiac Enzymes Recent Labs  Lab 01/21/20 2333 01/02/20 0440  TROPONINIHS 1,600* >27,000*    Radiology    CT Head Wo Contrast  Result Date: 01/02/2020 CLINICAL DATA:  64 year old male with syncope. EXAM: CT HEAD WITHOUT CONTRAST TECHNIQUE: Contiguous axial images were obtained from the base of the skull through the vertex without intravenous contrast. COMPARISON:  None. FINDINGS: Brain: Mild age-related atrophy and chronic microvascular ischemic changes. Old right occipital infarct. The false appears slightly prominent, possibly related to angulation. A trace parafalcine subdural hemorrhage is less likely. No other acute hemorrhage. No mass effect or midline shift. Vascular: High attenuation of the intracranial vasculature, likely hemoconcentration/dehydration. Skull: Normal. Negative for fracture or focal lesion. Sinuses/Orbits: Partial opacification of the sphenoid sinuses with air-fluid level. The mastoid air cells are clear. Other: An endotracheal and an enteric tube are partially visualized. IMPRESSION: 1. No acute intracranial pathology. 2. Mild age-related atrophy and chronic microvascular ischemic changes. Old right occipital infarct. Electronically Signed   By: Elgie Collard M.D.   On: 01/02/2020 00:51   DG Chest Port 1 View  Result Date: 01/02/2020 CLINICAL DATA:  Hypoxia EXAM: PORTABLE CHEST 1 VIEW COMPARISON:  01/02/2020, 01/01/2019 FINDINGS: Endotracheal tube  tip about 3.4 cm superior to the carina. Esophageal tube tip below the diaphragm but incompletely visualized. Cardiomegaly. Extensive bilateral lung consolidations with increasing air bronchograms. No pneumothorax. IMPRESSION: 1. Endotracheal tube tip about 3.4 cm superior to the carina. 2. Extensive bilateral lung consolidations with increasing air bronchograms. Cardiomegaly Electronically Signed   By: Jasmine Pang M.D.   On: 01/02/2020 03:32   DG Chest Port 1 View  Result Date: 01/02/2020 CLINICAL DATA:   Hypoxia EXAM: PORTABLE CHEST 1 VIEW COMPARISON:  12/31/2019 FINDINGS: Endotracheal tube tip is about 3.9 cm superior to the carina. Esophageal tube tip is below the diaphragm but incompletely visualized. Cardiomegaly. Extensive bilateral hazy pulmonary opacity with worsening consolidations. No pneumothorax. IMPRESSION: 1. Endotracheal tube tip about 3.9 cm superior to carina 2. Cardiomegaly 3. Interval worsening of diffuse bilateral hazy pulmonary opacity and patchy bilateral lung consolidations potentially representing edema or bilateral pneumonia. Electronically Signed   By: Jasmine Pang M.D.   On: 01/02/2020 02:52   DG Chest Portable 1 View  Result Date: 12/13/2019 CLINICAL DATA:  Status post CPR and intubation EXAM: PORTABLE CHEST 1 VIEW COMPARISON:  None. FINDINGS: Cardiac shadow is enlarged. Endotracheal tube and gastric catheter are noted in satisfactory position. Diffuse central vascular congestion is noted without significant edema. No definitive rib fractures are pneumothorax is identified. IMPRESSION: Tubes and lines as described. Central vascular congestion consistent with the recent CPR. Electronically Signed   By: Alcide Clever M.D.   On: 12/10/2019 23:57    Cardiac Studies   Echocardiogram pending.  Patient Profile     64 y.o. male with a history of nonischemic cardiomyopathy, atrial fibrillation and flutter, hypertension, and OSA.  He is currently admitted after a witnessed out of hospital cardiac arrest, one hour of ACLS with multiple shocks for VF and now with full supportive measures.  Neurological status unclear at this point.  Assessment & Plan    1.  Status post out of hospital cardiac arrest as outlined above.  Patient remains in sinus rhythm and hemodynamically stable with full supportive measures.  On Levophed and has also received vasopressin.  Echocardiogram is pending.  High-sensitivity troponin I level greater than 27,000.  ECG does not show STEMI.  2.  History of  dilated cardiomyopathy with normalization of LVEF at 55 to 60% as of January.  RV function also normal at that time.  3.  History of atrial fibrillation and flutter, on Eliquis as an outpatient.  He is in sinus rhythm at this time.  Plan to resume heparin.  I discussed the case with Dr. Delton Coombes.  Patient is full code at this time pending reevaluation of neurological status over the next 24 to 48 hours.  Echocardiogram pending for follow-up LVEF.  Resuming heparin.  Continue aspirin and Lipitor.  Signed, Nona Dell, MD  01/03/2020, 7:45 AM

## 2020-01-03 NOTE — Progress Notes (Signed)
PCCM Interval Note  I spoke with the patient's wife at bedside, with her daughter on phone, to update on patient's status, support and prognosis. All questions answered.   Levy Pupa, MD, PhD 01/03/2020, 11:56 AM [facility] [phone] or if no answer [phone]

## 2020-01-03 NOTE — Procedures (Signed)
Patient Name: Colin [name]  MRN: [id number]  Epilepsy Attending: Charlsie Quest  Referring Physician/Provider: Dr Levy Pupa Date: 01/03/2020 Duration: 23.33 mins  Patient history: 64yo m s/p cardiac arrest. EEG to evaluate for seizure.   Level of alertness: comatose  AEDs during EEG study: Propofol  Technical aspects: This EEG study was done with scalp electrodes positioned according to the 10-20 International system of electrode placement. Electrical activity was acquired at a sampling rate of 500Hz  and reviewed with a high frequency filter of 70Hz  and a low frequency filter of 1Hz . EEG data were recorded continuously and digitally stored.   Description: EEG showed continuous monomorphic generalized 5 to 6 Hz theta slowing with brief 1-2 seconds of generalized background attentuation.  Hyperventilation and photic stimulation were not performed.     ABNORMALITY -Continuous slow, generalized  IMPRESSION: This study is suggestive of profound diffuse encephalopathy, nonspecific etiology but likely related to sedation, anoxic/hypoxic brain injury. No seizures or epileptiform discharges were seen throughout the recording.  Dimitrios Balestrieri 

## 2020-01-03 NOTE — Progress Notes (Signed)
ANTICOAGULATION CONSULT NOTE   Pharmacy Consult for Heparin (Apixaban on hold) Indication: chest pain/ACS and atrial fibrillation  No Known Allergies  Patient Measurements: Height: 5\' 11"  (180.3 cm) Weight: 104.3 kg (230 lb) IBW/kg (Calculated) : 75.3  Vital Signs: Temp: 99 F (37.2 C) (08/29 1100) Temp Source: Bladder (08/29 0800) BP: 130/66 (08/29 1429) Pulse Rate: 91 (08/29 1429)  Labs: Recent Labs    12/17/2019 2333 01/02/20 0031 01/02/20 0440 01/02/20 0510 01/02/20 01/04/20 01/02/20 01/04/20 01/02/20 2357 01/03/20 0347 01/03/20 0357 01/03/20 0844 01/03/20 1714  HGB 16.6   < > 18.9*   < > 18.5*   < >  --  16.1 17.0  --   --   HCT 52.9*   < > 60.8*   < > 58.5*  --   --  49.5 50.0  --   --   PLT 220   < > 243  --  187  --   --  131*  --   --   --   APTT  --   --   --   --   --   --   --   --   --  41* 142*  HEPARINUNFRC  --   --   --   --   --   --   --   --   --  0.14* 0.37  CREATININE 1.94*   < > 2.40*   < > 2.72*  --  3.21* 3.06*  --   --   --   TROPONINIHS 1,600*  --  >27,000*  --   --   --   --   --   --   --   --    < > = values in this interval not displayed.    Estimated Creatinine Clearance: 30.4 mL/min (A) (by C-G formula based on SCr of 3.06 mg/dL (H)).   Medical History: Past Medical History:  Diagnosis Date  . Atrial flutter (HCC)   . DCM (dilated cardiomyopathy) (HCC)    EF 35-40% years ago but echo a year ago showed normal LVF  . Dilated aortic root (HCC) 04/15/2015   80mm by echo 06/2017 and Chest CT 12/2016  . Hypertension   . Obesities, morbid (HCC)   . OSA (obstructive sleep apnea)   . Persistent atrial fibrillation [facility])    s/p TEE/DCCV now on Sotolol     Assessment: 64 y/o M with prolonged cardiac arrest s/p ROSC. Holding apixaban and starting heparin for afib/NSTEMI. Last apixaban dose was 8/27, but timing is unclear.  Heparin started yesterday AM, then stopped for central line placement.  Kept off overnight since plans for continued care  were unclear.  Patient is now full-code and pursuing aggressive treatment.  Discussed with Dr. 9/27, will restart IV heparin now.  Heparin level came back therapeutic at 0.37, aPTT came back supratherapeutic at 142? Confirmed with RN that heparin running peripherally and level drawn centrally. LFTs elevated. No s/sx of bleeding or infusion issues.   Goal of Therapy:  Heparin level 0.3-0.7 units/ml aPTT 66-102 seconds Monitor platelets by anticoagulation protocol: Yes   Plan:  Will go off therapeutic heparin level, will continue at same rate of 1400 units/hr Check aPTT/Heparin level in 8 hrs. Daily CBC/heparin level/aPTT Monitor for bleeding  Diona Browner, PharmD, BCCCP Clinical Pharmacist  Phone: [phone] 01/03/2020 6:32 PM  Please check AMION for all [facility] phone numbers After 10:00 PM, call Main Pharmacy [phone]

## 2020-01-03 NOTE — CV Procedure (Signed)
Central Venous Catheter Insertion Procedure Note  Colin [name]  [id number]  [birth year]  Date:01/03/20  Time:6:25 AM   Provider Performing:Colin [name] Colin [name]   Procedure: Insertion of Non-tunneled Central Venous [phone]) with US guidance ([id number])   Indication(s) Medication administration and Difficult access  Consent Risks of the procedure as well as the alternatives and risks of each were explained to the patient and/or caregiver.  Consent for the procedure was obtained and is signed in the bedside chart  Anesthesia Topical only with 1% lidocaine   Timeout Verified patient identification, verified procedure, site/side was marked, verified correct patient position, special equipment/implants available, medications/allergies/relevant history reviewed, required imaging and test results available.  Sterile Technique Maximal sterile technique including full sterile barrier drape, hand hygiene, sterile gown, sterile gloves, mask, hair covering, sterile ultrasound probe cover (if used).  Procedure Description Area of catheter insertion was cleaned with chlorhexidine and draped in sterile fashion.  With real-time ultrasound guidance a central venous catheter was placed into the left internal jugular vein. Nonpulsatile blood flow and easy flushing noted in all ports.  The catheter was sutured in place and sterile dressing applied.  Complications/Tolerance None; patient tolerated the procedure well. Chest X-ray is ordered to verify placement for internal jugular or subclavian cannulation.   Chest x-ray is not ordered for femoral cannulation.  EBL Minimal  Specimen(s) None      Colin Gasman Laken Rog, PA-C

## 2020-01-03 NOTE — Progress Notes (Signed)
Chaplain responded to nurse request to attend family who were presently involved in trying to decide whether to move their husband/father to comfort care.  Chaplain established relationship of care and concern for wife and her two daughters who were present at bedside.  Chaplain offered ministry of presence as family discussed among themselves and with a critical care nurse what the next steps should be.  Family arrived at decision to make no decision tonight. Chaplain prayed with family at bedside and escorted family from hospital.  Vernell Morgans Chaplain

## 2020-01-04 ENCOUNTER — Inpatient Hospital Stay (HOSPITAL_COMMUNITY): Payer: Commercial Managed Care - PPO

## 2020-01-04 DIAGNOSIS — R0902 Hypoxemia: Secondary | ICD-10-CM

## 2020-01-04 LAB — PHOSPHORUS: Phosphorus: 3.1 mg/dL (ref 2.5–4.6)

## 2020-01-04 LAB — COMPREHENSIVE METABOLIC PANEL
ALT: 105 U/L — ABNORMAL HIGH (ref 0–44)
AST: 213 U/L — ABNORMAL HIGH (ref 15–41)
Albumin: 2.7 g/dL — ABNORMAL LOW (ref 3.5–5.0)
Alkaline Phosphatase: 74 U/L (ref 38–126)
Anion gap: 12 (ref 5–15)
BUN: 34 mg/dL — ABNORMAL HIGH (ref 8–23)
CO2: 26 mmol/L (ref 22–32)
Calcium: 7.8 mg/dL — ABNORMAL LOW (ref 8.9–10.3)
Chloride: 102 mmol/L (ref 98–111)
Creatinine, Ser: 2.32 mg/dL — ABNORMAL HIGH (ref 0.61–1.24)
GFR calc Af Amer: 33 mL/min — ABNORMAL LOW (ref >60)
GFR calc non Af Amer: 29 mL/min — ABNORMAL LOW (ref >60)
Glucose, Bld: 169 mg/dL — ABNORMAL HIGH (ref 70–99)
Potassium: 4.2 mmol/L (ref 3.5–5.1)
Sodium: 140 mmol/L (ref 135–145)
Total Bilirubin: 1.9 mg/dL — ABNORMAL HIGH (ref 0.3–1.2)
Total Protein: 5.6 g/dL — ABNORMAL LOW (ref 6.5–8.1)

## 2020-01-04 LAB — GLUCOSE, CAPILLARY
Glucose-Capillary: 116 mg/dL — ABNORMAL HIGH (ref 70–99)
Glucose-Capillary: 130 mg/dL — ABNORMAL HIGH (ref 70–99)
Glucose-Capillary: 181 mg/dL — ABNORMAL HIGH (ref 70–99)
Glucose-Capillary: 207 mg/dL — ABNORMAL HIGH (ref 70–99)
Glucose-Capillary: 219 mg/dL — ABNORMAL HIGH (ref 70–99)
Glucose-Capillary: 221 mg/dL — ABNORMAL HIGH (ref 70–99)

## 2020-01-04 LAB — CBC
HCT: 43 % (ref 39.0–52.0)
Hemoglobin: 14.1 g/dL (ref 13.0–17.0)
MCH: 27.8 pg (ref 26.0–34.0)
MCHC: 32.8 g/dL (ref 30.0–36.0)
MCV: 84.6 fL (ref 80.0–100.0)
Platelets: 103 10*3/uL — ABNORMAL LOW (ref 150–400)
RBC: 5.08 MIL/uL (ref 4.22–5.81)
RDW: 15.9 % — ABNORMAL HIGH (ref 11.5–15.5)
WBC: 8.1 10*3/uL (ref 4.0–10.5)
nRBC: 0 % (ref 0.0–0.2)

## 2020-01-04 LAB — MAGNESIUM: Magnesium: 2.3 mg/dL (ref 1.7–2.4)

## 2020-01-04 LAB — TRIGLYCERIDES: Triglycerides: 113 mg/dL (ref <150)

## 2020-01-04 LAB — PATHOLOGIST SMEAR REVIEW: Path Review: REACTIVE

## 2020-01-04 LAB — HEPARIN LEVEL (UNFRACTIONATED): Heparin Unfractionated: 0.61 IU/mL (ref 0.30–0.70)

## 2020-01-04 LAB — LACTIC ACID, PLASMA: Lactic Acid, Venous: 3.7 mmol/L (ref 0.5–1.9)

## 2020-01-04 MED ORDER — FENTANYL BOLUS VIA INFUSION
100.0000 ug | Freq: Once | INTRAVENOUS | Status: AC
  Administered 2020-01-04: 100 ug via INTRAVENOUS

## 2020-01-04 MED ORDER — ASPIRIN 81 MG PO CHEW
324.0000 mg | CHEWABLE_TABLET | Freq: Every day | ORAL | Status: DC
  Administered 2020-01-04 – 2020-01-09 (×5): 324 mg
  Filled 2020-01-04 (×5): qty 4

## 2020-01-04 MED ORDER — SENNOSIDES 8.8 MG/5ML PO SYRP
5.0000 mL | ORAL_SOLUTION | Freq: Every day | ORAL | Status: DC
  Administered 2020-01-04 – 2020-01-07 (×4): 5 mL
  Filled 2020-01-04 (×4): qty 5

## 2020-01-04 MED ORDER — ROCURONIUM BROMIDE 10 MG/ML (PF) SYRINGE
PREFILLED_SYRINGE | INTRAVENOUS | Status: AC
  Filled 2020-01-04: qty 10

## 2020-01-04 MED ORDER — AMIODARONE HCL IN DEXTROSE 360-4.14 MG/200ML-% IV SOLN
60.0000 mg/h | INTRAVENOUS | Status: AC
  Administered 2020-01-04 (×2): 60 mg/h via INTRAVENOUS
  Filled 2020-01-04 (×2): qty 200

## 2020-01-04 MED ORDER — LEVOTHYROXINE SODIUM 100 MCG PO TABS
200.0000 ug | ORAL_TABLET | ORAL | Status: DC
  Administered 2020-01-05 – 2020-01-09 (×4): 200 ug
  Filled 2020-01-04 (×4): qty 2

## 2020-01-04 MED ORDER — POLYETHYLENE GLYCOL 3350 17 G PO PACK: 17.0000 g | PACK | Freq: Every day | ORAL | Status: DC | PRN

## 2020-01-04 MED ORDER — LEVOTHYROXINE SODIUM 100 MCG PO TABS: 100.0000 ug | ORAL_TABLET | ORAL | Status: DC

## 2020-01-04 MED ORDER — AMIODARONE HCL IN DEXTROSE 360-4.14 MG/200ML-% IV SOLN
30.0000 mg/h | INTRAVENOUS | Status: DC
  Administered 2020-01-04 – 2020-01-09 (×9): 30 mg/h via INTRAVENOUS
  Filled 2020-01-04 (×10): qty 200

## 2020-01-04 MED ORDER — METOPROLOL TARTRATE 25 MG/10 ML ORAL SUSPENSION
12.5000 mg | Freq: Four times a day (QID) | ORAL | Status: DC
  Administered 2020-01-04 – 2020-01-05 (×3): 12.5 mg
  Filled 2020-01-04 (×3): qty 5

## 2020-01-04 MED ORDER — PROSOURCE TF PO LIQD
90.0000 mL | Freq: Three times a day (TID) | ORAL | Status: DC
  Administered 2020-01-04 – 2020-01-07 (×11): 90 mL
  Filled 2020-01-04 (×12): qty 90

## 2020-01-04 MED ORDER — VITAL HIGH PROTEIN PO LIQD
1000.0000 mL | ORAL | Status: DC
  Administered 2020-01-04 – 2020-01-07 (×4): 1000 mL

## 2020-01-04 MED ORDER — NOREPINEPHRINE 16 MG/250ML-% IV SOLN
0.0000 ug/min | INTRAVENOUS | Status: DC
  Administered 2020-01-04: 14 ug/min via INTRAVENOUS
  Filled 2020-01-04: qty 250

## 2020-01-04 MED ORDER — ROCURONIUM BROMIDE 50 MG/5ML IV SOLN
80.0000 mg | Freq: Once | INTRAVENOUS | Status: AC
  Administered 2020-01-04: 80 mg via INTRAVENOUS
  Filled 2020-01-04: qty 8

## 2020-01-04 MED ORDER — METOPROLOL TARTRATE 12.5 MG HALF TABLET
12.5000 mg | ORAL_TABLET | Freq: Four times a day (QID) | ORAL | Status: DC
  Administered 2020-01-04 (×2): 12.5 mg via ORAL
  Filled 2020-01-04 (×2): qty 1

## 2020-01-04 MED ORDER — ATORVASTATIN CALCIUM 80 MG PO TABS
80.0000 mg | ORAL_TABLET | Freq: Every day | ORAL | Status: DC
  Administered 2020-01-05 – 2020-01-07 (×3): 80 mg
  Filled 2020-01-04 (×3): qty 1

## 2020-01-04 MED ORDER — AMIODARONE LOAD VIA INFUSION
150.0000 mg | Freq: Once | INTRAVENOUS | Status: AC
  Administered 2020-01-05: 150 mg via INTRAVENOUS
  Filled 2020-01-04: qty 83.34

## 2020-01-04 MED ORDER — DOCUSATE SODIUM 50 MG/5ML PO LIQD
100.0000 mg | Freq: Two times a day (BID) | ORAL | Status: DC
  Administered 2020-01-04 – 2020-01-07 (×7): 100 mg
  Filled 2020-01-04 (×8): qty 10

## 2020-01-04 MED ORDER — AMIODARONE IV BOLUS ONLY 150 MG/100ML: 150.0000 mg | Freq: Once | INTRAVENOUS | Status: DC

## 2020-01-04 MED ORDER — AMIODARONE LOAD VIA INFUSION
150.0000 mg | Freq: Once | INTRAVENOUS | Status: AC
  Administered 2020-01-04: 150 mg via INTRAVENOUS
  Filled 2020-01-04: qty 83.34

## 2020-01-04 NOTE — Progress Notes (Signed)
eLink Physician-Brief Progress Note Patient Name: JOURDAIN GUAY DOB: [birth year] MRN: [id number]   Date of Service  01/04/2020  HPI/Events of Note  Patient with Afib with RVR on maintenance Amiodarone infusion, ventricular response rate is uncontrolled with rates from 112-134.  eICU Interventions  Will give a 150 mg iv Amiodarone bolus to try to bring the VRR under control.        Thomasene Lot Alphons Burgert 01/04/2020, 11:44 PM

## 2020-01-04 NOTE — Progress Notes (Signed)
eLink Physician-Brief Progress Note Patient Name: Colin [name] DOB: 02-23-1956 MRN: [id number]   Date of Service  01/04/2020  HPI/Events of Note  Mildly hypertensive with SBPs in 140s and with PVCs noted by RN. Previously had several runs of NSVT earlier in day per nursing notes. On metoprolol tartrate 50 mg BID at home.   eICU Interventions  Start metop tartrate 12.5mg  Q6H with hold parameters.     Intervention Category Intermediate Interventions: Arrhythmia - evaluation and management;Hypertension - evaluation and management  Marveen Reeks Galya Dunnigan 01/04/2020, 12:15 AM

## 2020-01-04 NOTE — Progress Notes (Signed)
ANTICOAGULATION CONSULT NOTE   Pharmacy Consult for Heparin (Apixaban on hold) Indication: chest pain/ACS and atrial fibrillation  No Known Allergies  Patient Measurements: Height: 5\' 11"  (180.3 cm) Weight: 104.3 kg (230 lb) IBW/kg (Calculated) : 75.3  Vital Signs: Temp: 99.9 F (37.7 C) (08/30 0300) Temp Source: Bladder (08/30 0000) BP: 150/73 (08/30 0038) Pulse Rate: 91 (08/30 0300)  Labs: Recent Labs    01/27/20 2333 01/02/20 0031 01/02/20 0440 01/02/20 0510 01/02/20 0922 01/02/20 0922 01/02/20 2357 01/03/20 0347 01/03/20 0357 01/03/20 0844 01/03/20 1714 01/03/20 2138 01/04/20 0402  HGB 16.6   < > 18.9*   < > 18.5*   < >  --  16.1 17.0  --   --   --   --   HCT 52.9*   < > 60.8*   < > 58.5*  --   --  49.5 50.0  --   --   --   --   PLT 220   < > 243  --  187  --   --  131*  --   --   --   --   --   APTT  --   --   --   --   --   --   --   --   --  41* 142*  --   --   HEPARINUNFRC  --   --   --   --   --   --   --   --   --  0.14* 0.37  --  0.61  CREATININE 1.94*   < > 2.40*   < > 2.72*   < > 3.21* 3.06*  --   --   --  2.43*  --   TROPONINIHS 1,600*  --  >27,000*  --   --   --   --   --   --   --   --   --   --    < > = values in this interval not displayed.    Estimated Creatinine Clearance: 38.2 mL/min (A) (by C-G formula based on SCr of 2.43 mg/dL (H)).   Medical History: Past Medical History:  Diagnosis Date  . Atrial flutter (HCC)   . DCM (dilated cardiomyopathy) (HCC)    EF 35-40% years ago but echo a year ago showed normal LVF  . Dilated aortic root (HCC) 04/15/2015   15mm by echo 06/2017 and Chest CT 12/2016  . Hypertension   . Obesities, morbid (HCC)   . OSA (obstructive sleep apnea)   . Persistent atrial fibrillation [facility])    s/p TEE/DCCV now on Sotolol     Assessment: 64 y/o M with prolonged cardiac arrest s/p ROSC. Holding apixaban and starting heparin for afib/NSTEMI. Last apixaban dose was 8/27, but timing is unclear.  Heparin started  yesterday AM, then stopped for central line placement.  Kept off overnight since plans for continued care were unclear.  Patient is now full-code and pursuing aggressive treatment.  Discussed with Dr. 9/27, will restart IV heparin now.  8/30 AM update:  Heparin level therapeutic x 2  Goal of Therapy:  Heparin level 0.3-0.7 units/mL Monitor platelets by anticoagulation protocol: Yes   Plan:  Cont heparin 1400 units/hr Daily CBC/HL Monitor for bleeding  9/30, PharmD, BCPS Clinical Pharmacist Phone: [phone]

## 2020-01-04 NOTE — Progress Notes (Signed)
ANTICOAGULATION CONSULT NOTE   Pharmacy Consult for Heparin (Apixaban on hold) Indication: chest pain/ACS and atrial fibrillation  No Known Allergies  Patient Measurements: Height: 5\' 11"  (180.3 cm) Weight: 114.8 kg (253 lb 1.4 oz) IBW/kg (Calculated) : 75.3  Vital Signs: Temp: 100 F (37.8 C) (08/30 0800) Temp Source: Bladder (08/30 0800) BP: 130/81 (08/30 0800) Pulse Rate: 90 (08/30 0803)  Labs: Recent Labs    01-24-2020 2333 01/02/20 0031 01/02/20 0440 01/02/20 0510 01/02/20 0922 01/02/20 2357 01/03/20 0347 01/03/20 0347 01/03/20 0357 01/03/20 0844 01/03/20 1714 01/03/20 2138 01/04/20 0402  HGB 16.6   < > 18.9*   < > 18.5*  --  16.1   < > 17.0  --   --   --  14.1  HCT 52.9*   < > 60.8*   < > 58.5*  --  49.5  --  50.0  --   --   --  43.0  PLT 220   < > 243   < > 187  --  131*  --   --   --   --   --  103*  APTT  --   --   --   --   --   --   --   --   --  41* 142*  --   --   HEPARINUNFRC  --   --   --   --   --   --   --   --   --  0.14* 0.37  --  0.61  CREATININE 1.94*   < > 2.40*   < > 2.72*   < > 3.06*  --   --   --   --  2.43* 2.32*  TROPONINIHS 1,600*  --  >27,000*  --   --   --   --   --   --   --   --   --   --    < > = values in this interval not displayed.    Estimated Creatinine Clearance: 42 mL/min (A) (by C-G formula based on SCr of 2.32 mg/dL (H)).   Medical History: Past Medical History:  Diagnosis Date  . Atrial flutter (HCC)   . DCM (dilated cardiomyopathy) (HCC)    EF 35-40% years ago but echo a year ago showed normal LVF  . Dilated aortic root (HCC) 04/15/2015   45mm by echo 06/2017 and Chest CT 12/2016  . Hypertension   . Obesities, morbid (HCC)   . OSA (obstructive sleep apnea)   . Persistent atrial fibrillation [facility])    s/p TEE/DCCV now on Sotolol     Assessment: 64 y/o M with prolonged cardiac arrest s/p ROSC. Holding apixaban and starting heparin for afib/NSTEMI. Last apixaban dose was 8/27, but timing is unclear.  Pharmacy  consulted to dose IV heparin.  Patient remains therapeutic on heparin, however overnight HL increased to upper end of goal.  Will decrease rate slightly to ensure patient does not become supratherapeutic. CBC wnl.  Goal of Therapy:  Heparin level 0.3-0.7 units/mL Monitor platelets by anticoagulation protocol: Yes   Plan:  Decrease heparin to 1300 units/hr Monitor daily HL and CBC Monitor for signs/symptoms of bleeding  9/27, PharmD PGY-1 Acute Care Pharmacy Resident 01/04/2020 11:29 AM

## 2020-01-04 NOTE — Progress Notes (Signed)
Progress Note  Patient Name: Colin [name] Date of Encounter: 01/04/2020  Primary Cardiologist: Fransico Him, MD  Subjective   No purposeful movement. Sedation is now off.   Inpatient Medications    Scheduled Meds: . acetaminophen  650 mg Oral Q4H   Or  . acetaminophen (TYLENOL) oral liquid 160 mg/5 mL  650 mg Per Tube Q4H   Or  . acetaminophen  650 mg Rectal Q4H  . aspirin EC  325 mg Oral Daily  . atorvastatin  80 mg Oral Daily  . chlorhexidine gluconate (MEDLINE KIT)  15 mL Mouth Rinse BID  . Chlorhexidine Gluconate Cloth  6 each Topical Daily  . insulin aspart  0-15 Units Subcutaneous Q4H  . levothyroxine  200 mcg Oral Once per day on Mon Tue Wed Thu Fri Sat   And  . levothyroxine  100 mcg Oral Once per day on Sun  . mouth rinse  15 mL Mouth Rinse 10 times per day  . metoprolol tartrate  12.5 mg Oral Q6H  . pantoprazole (PROTONIX) IV  40 mg Intravenous Daily  . sodium chloride flush  10-40 mL Intracatheter Q12H   Continuous Infusions: . sodium chloride    . ampicillin-sulbactam (UNASYN) IV 3 g (01/04/20 0514)  . fentaNYL infusion INTRAVENOUS 50 mcg/hr (01/03/20 1830)  . heparin 1,400 Units/hr (01/04/20 0300)  . norepinephrine (LEVOPHED) Adult infusion Stopped (01/04/20 0007)  . propofol (DIPRIVAN) infusion Stopped (01/03/20 1147)  . sodium bicarbonate (isotonic) 150 mEq in D5W 1000 mL infusion 40 mL/hr at 01/04/20 0300   PRN Meds: docusate sodium, polyethylene glycol, sodium chloride flush   Vital Signs    Vitals:   01/04/20 0200 01/04/20 0300 01/04/20 0500 01/04/20 0509  BP:    (!) 155/75  Pulse: 90 91  93  Resp:      Temp: 99.9 F (37.7 C) 99.9 F (37.7 C)    TempSrc:      SpO2: 99% 100%    Weight:   114.8 kg   Height:        Intake/Output Summary (Last 24 hours) at 01/04/2020 0751 Last data filed at 01/04/2020 0300 Gross per 24 hour  Intake 2252.6 ml  Output 1480 ml  Net 772.6 ml   Filed Weights   12/19/2019 2333 01/04/20 0500  Weight: 104.3  kg 114.8 kg    Telemetry    Sinus with PVCs and short runs of NSVT - Personally reviewed.  ECG    No AM EKG  Physical Exam    General: Well developed, well nourished, NAD, intubated SKIN: warm, dry. No rashes. Neuro: No purposeful movements Musculoskeletal: unable to assess Neck: No JVD Lungs:Clear bilaterally, no wheezes, rhonci, crackles Cardiovascular: Regular rate and rhythm. No murmurs, gallops or rubs. Abdomen:Soft.  Extremities: No lower extremity edema.    Labs    Chemistry Recent Labs  Lab 01/02/20 0440 01/02/20 0510 01/03/20 0347 01/03/20 0347 01/03/20 0357 01/03/20 2138 01/04/20 0402  NA 137   < > 136   < > 139 140 140  K 3.7   < > 4.4   < > 4.5 4.2 4.2  CL 101   < > 104  --   --  102 102  CO2 20*   < > 20*  --   --  25 26  GLUCOSE 264*   < > 286*  --   --  147* 169*  BUN 19   < > 34*  --   --  32* 34*  CREATININE  2.40*   < > 3.06*  --   --  2.43* 2.32*  CALCIUM 8.9   < > 7.9*  --   --  8.0* 7.8*  PROT 6.4*  --  5.9*  --   --   --  5.6*  ALBUMIN 3.6  --  3.0*  --   --   --  2.7*  AST 332*  --  339*  --   --   --  213*  ALT 185*  --  142*  --   --   --  105*  ALKPHOS 118  --  71  --   --   --  74  BILITOT 1.2  --  1.4*  --   --   --  1.9*  GFRNONAA 28*   < > 21*  --   --  27* 29*  GFRAA 32*   < > 24*  --   --  32* 33*  ANIONGAP 16*   < > 12  --   --  13 12   < > = values in this interval not displayed.     Hematology Recent Labs  Lab 01/02/20 4270 01/02/20 0922 01/03/20 0347 01/03/20 0357 01/04/20 0402  WBC 1.5*  --  6.0  --  8.1  RBC 6.71*  --  5.95*  --  5.08  HGB 18.5*   < > 16.1 17.0 14.1  HCT 58.5*   < > 49.5 50.0 43.0  MCV 87.2  --  83.2  --  84.6  MCH 27.6  --  27.1  --  27.8  MCHC 31.6  --  32.5  --  32.8  RDW 16.5*  --  15.9*  --  15.9*  PLT 187  --  131*  --  103*   < > = values in this interval not displayed.    Cardiac Enzymes Recent Labs  Lab 01/03/2020 2333 01/02/20 0440  TROPONINIHS 1,600* >27,000*     Radiology    DG CHEST PORT 1 VIEW  Result Date: 01/03/2020 CLINICAL DATA:  Bilateral lung consolidations. EXAM: PORTABLE CHEST 1 VIEW COMPARISON:  January 02, 2020 FINDINGS: Bilateral pulmonary infiltrates persist, significant but mildly improved in the interval. Stable cardiomegaly. The hila and mediastinum are unchanged. No pneumothorax. The ETT is in good position. A new left central line terminates just below the brachiocephalic confluence. An NG tube terminates below today's film. No other acute abnormalities. IMPRESSION: 1. A new left central line is in good position with the tip just below the brachiocephalic confluence. No pneumothorax. 2. Other support apparatus as above. 3. Bilateral pulmonary infiltrates remain at our significant but mildly improved. Electronically Signed   By: Dorise Bullion III M.D   On: 01/03/2020 09:19   EEG adult  Result Date: 01/03/2020 Lora Havens, MD     01/03/2020  1:28 PM Patient Name: Colin [name] MRN: [id number] Epilepsy Attending: Lora Havens Referring Physician/Provider: Dr Baltazar Apo Date: 01/03/2020 Duration: 23.33 mins Patient history: 64yo m s/p cardiac arrest. EEG to evaluate for seizure. Level of alertness: comatose AEDs during EEG study: Propofol Technical aspects: This EEG study was done with scalp electrodes positioned according to the 10-20 International system of electrode placement. Electrical activity was acquired at a sampling rate of _0  and reviewed with a high frequency filter of _1  and a low frequency filter of _2 . EEG data were recorded continuously and digitally stored. Description: EEG showed continuous monomorphic generalized 5 to 6 Hz theta slowing  with brief 1-2 seconds of generalized background attentuation.  Hyperventilation and photic stimulation were not performed.   ABNORMALITY -Continuous slow, generalized IMPRESSION: This study is suggestive of profound diffuse encephalopathy, nonspecific etiology but likely  related to sedation, anoxic/hypoxic brain injury. No seizures or epileptiform discharges were seen throughout the recording. Lora Havens    Cardiac Studies   Echocardiogram pending.  Patient Profile     64 y.o. male with a history of nonischemic cardiomyopathy (normalization by echo January 2021), atrial fibrillation and flutter, hypertension, and sleep apnea admitted after a witnessed out of hospital cardiac arrest, one hour of ACLS with multiple shocks for VF and now with full supportive measures.  Neurological status unclear at this point.  Assessment & Plan    1.  Out of hospital cardiac arrest: He is in sinus rhythm today with PVCs. BP stable. Echo pending today. Will plan further cardiac workup if he has neurological recovery.   2.  History of dilated cardiomyopathy: LVEF normal by echo in January 2021. Echo pending today  3.  History of atrial fibrillation and flutter: sinus today. Continue heparin. He is on Eliquis as an outpatient.    Signed, Lauree Chandler, MD  01/04/2020, 7:51 AM

## 2020-01-04 NOTE — Progress Notes (Signed)
NAME:  Colin [name], MRN:  [id number], DOB:  1955/12/15, LOS: 2 ADMISSION DATE:  12/31/2019, CONSULTATION DATE:  01/04/20 REFERRING MD:  EDP, CHIEF COMPLAINT:  Cardiac arrest   Brief History   64 year old male past medical history of A. fib/a flutter on Eliquis, cardiomyopathy, hypertension, obesity, OSA who had a witnessed out of hospital V. fib arrest with 1 hour of CPR before ROSC obtained.  Intubated and PCCM consulted for admission  History of present illness   Colin [name] is a 64 year old male with past medical history of A. fib/a flutter s/p ablation on Eliquis, dilated aortic root cardiomyopathy, hypertension, obesity, OSA who has been in his usual state of health recently, though his wife noted he seemed to be breathing heavier and was more tired than usual for about the last day.  She says he never complains, just went to bed early.  This evening she was talking to him when he collapsed in front of her.  She called 911 and started chest compressions.  EMS found patient in V-fib, he was intubated and defibrillated multiple times, given amiodarone and a total of 7 mg of epi was obtained, estimated 1 hour of ACLS.  On arrival to the ED, patient was unresponsive, head CT without acute findings.  EKG with new anterior ST depressions and troponin 1,600.  Cardiology was consulted and confirmed no STEMI. blood work significant for lactic acidosis and leukocytosis with AKI and elevated LFTs.  Covid-19 negative no other source of infection noted.  PCCM consulted for admission  Past Medical History   has a past medical history of Atrial flutter (HCC), DCM (dilated cardiomyopathy) (HCC), Dilated aortic root (HCC) (04/15/2015), Hypertension, Obesities, morbid (HCC), OSA (obstructive sleep apnea), and Persistent atrial fibrillation (HCC).   Significant Hospital Events   8/28 Admit to PCCM  Consults:  cardiology  Procedures:  8/27 ETT  Significant Diagnostic Tests:  8/27 CXR>>Central vascular  congestion consistent with the recent CPR. 8/28 CT head>>Mild age-related atrophy and chronic microvascular ischemic changes. Old right occipital infarct. 8/29 EEG > profound diffuse encephalopathy without seizure or epileptiform discharges.  Micro Data:  8/28 Sars-Cov-2>>negative  Antimicrobials:  Unasyn 8/28 >>   Interim history/subjective:  Off sedation but remains unable to follow commands. Intermittently opens eyes to noxious stimuli and intermittently withdraws to pain.  Objective   Blood pressure 130/81, pulse 90, temperature 100 F (37.8 C), temperature source Bladder, resp. rate (!) 30, height 5\' 11"  (1.803 m), weight 114.8 kg, SpO2 100 %.    Vent Mode: PRVC FiO2 (%):  [50 %-100 %] 50 % Set Rate:  [28 bmp] 28 bmp Vt Set:  [530 mL] 530 mL PEEP:  [16 cmH20] 16 cmH20 Plateau Pressure:  [21 cmH20-30 cmH20] 21 cmH20   Intake/Output Summary (Last 24 hours) at 01/04/2020 01/06/2020 Last data filed at 01/04/2020 0800 Gross per 24 hour  Intake 2622.69 ml  Output 1895 ml  Net 727.69 ml   Filed Weights   12/07/2019 2333 01/04/20 0500  Weight: 104.3 kg 114.8 kg    General: Obese man, ventilated, ill-appearing HEENT: ETT in place, MM moist Neuro: Unresponsive despite no sedation, withdraws to pain intermittently CV: Regular, no murmur PULM: Coarse bilateral breath sounds with inspiratory crackles, no wheezing GI: Obese, nondistended, hypoactive bowel sounds present Extremities: Trace pretibial edema Skin: No rash   Assessment & Plan:   Acute respiratory failure with hypoxemia, ventilator dependence ARDS physiology, suspect aspiration pneumonia Witnessed, out of hospital V. fib cardiac arrest - 1 hour ACLS  before ROSC, CT head negative.  Underlying cause unclear, possible non-STEMI.  Doubt PE since he is on chronic anticoagulation. -PRVC, currently 8 cc/kg with high PEEP and high FiO2 needs (1.00, PEEP 18).  Goal decrease VT to 6 cc/kg when ventilation will allow. - Empiric  Unasyn. - Sedation as per PAD protocol. - Follow chest x-ray, ABG. - VAP prevention order set. - Attempt to maintain normothermia, formal TTM deferred.  NSTEMI, history hypertension, atrial fibrillation, cardiomyopathy Ventricular fibrillation, out of hospital cardiac arrest - Last echocardiogram with EF 55-60%. Initial troponin 1,600, appreciate cardiology recommendations. - Continue aspirin, heparin. - Continue norepinephrine. - F/u on repeat echo. - Cards following.  Acute kidney injury, ATN due to arrest, nonoliguric 8/29 - improving. - Continue supportive care. - Follow BMP and follow for renal recovery.  He is a poor candidate for hemodialysis given his other presumed endorgan injuries.  We will consider nephrology consultation depending on course.  Elevated LFTs, likely shock liver - gradually improving. - Follow coags, LFT/  AGMA, lactic acidosis - Likely secondary to prolonged cardiac arrest, no evidence of recent infection by history and no source on work-up thus far- now resolved. - D/c sodium bicarbonate infusion and continue supportive care.   Best practice:  Diet: Start TF's Pain/Anxiety/Delirium protocol (if indicated): fentanyl VAP protocol (if indicated): HOB 30 degrees, suction as needed DVT prophylaxis: Heparin GI prophylaxis: Protonix Glucose control: SSI Mobility: Bedrest Code Status: Full code Family Communication: Will call wife. Disposition: ICU   Critical care time: 35 minutes.    Rutherford Guys, [place] Sidonie Dickens Pulmonary [facility] Critical Care Medicine 01/04/2020, 8:30 AM

## 2020-01-04 NOTE — Progress Notes (Signed)
Initial Nutrition Assessment  DOCUMENTATION CODES:   Obesity unspecified  INTERVENTION:   Initiate tube feeding via OGT: Vital High Protein at 45 ml/h (1080 ml per day) Prosource TF 90 ml TID  Provides 1320 kcal (1568 kcal total with propofol), 161 gm protein, 903 ml free water daily  NUTRITION DIAGNOSIS:   Inadequate oral intake related to inability to eat as evidenced by NPO status.  GOAL:   Provide needs based on ASPEN/SCCM guidelines  MONITOR:   Vent status, TF tolerance, Labs  REASON FOR ASSESSMENT:   Ventilator, Consult Enteral/tube feeding initiation and management  ASSESSMENT:   64 yo male admitted with out of hospital V fib cardiac arrest, S/P CPR x 1 hour. PMH includes A flutter, morbid obesity, HTN, OSA, A fib, dilated cardiomyopathy, former smoker.   Patient is being treated for ARDS, likely aspiration PNA. Maintaining normotherapy d/t NSTEMI. Remains encephalopathic. Guarded prognosis. Supportive care continues. Received MD Consult for TF initiation and management. OG tube in place.  Patient is currently intubated on ventilator support MV: 15 L/min Temp (24hrs), Avg:99.7 F (37.6 C), Min:99.1 F (37.3 C), Max:100.2 F (37.9 C)  Propofol: 9.4 ml/hr providing 248 kcal from lipid  Labs reviewed. BUN 34, creat 2.32 CBG: 116-181-207  Medications reviewed and include colace, novolog, senokot, levophed, propofol.   Recent weights reviewed. No significant weight changes over the past 8 months.  NUTRITION - FOCUSED PHYSICAL EXAM:    Most Recent Value  Orbital Region No depletion  Upper Arm Region No depletion  Thoracic and Lumbar Region No depletion  Buccal Region No depletion  Temple Region No depletion  Clavicle Bone Region No depletion  Clavicle and Acromion Bone Region No depletion  Scapular Bone Region Unable to assess  Dorsal Hand No depletion  Patellar Region No depletion  Anterior Thigh Region No depletion  Posterior Calf Region No  depletion  Edema (RD Assessment) Mild  Hair Reviewed  Eyes Unable to assess  Mouth Unable to assess  Skin Reviewed  Nails Reviewed       Diet Order:   Diet Order            Diet NPO time specified  Diet effective now                 EDUCATION NEEDS:   No education needs have been identified at this time  Skin:  Skin Assessment: Reviewed RN Assessment  Last BM:  no BM documented  Height:   Ht Readings from Last 1 Encounters:  2020-01-11 5\' 11"  (1.803 m)    Weight:   Wt Readings from Last 1 Encounters:  01/04/20 114.8 kg    Ideal Body Weight:  78.2 kg  BMI:  Body mass index is 35.3 kg/m.  Estimated Nutritional Needs:   Kcal:  1260-1610  Protein:  156 gm  Fluid:  1.8-2 L    01/06/20, RD, LDN, CNSC Please refer to Amion for contact information.

## 2020-01-05 ENCOUNTER — Inpatient Hospital Stay (HOSPITAL_COMMUNITY): Payer: Commercial Managed Care - PPO

## 2020-01-05 DIAGNOSIS — I469 Cardiac arrest, cause unspecified: Secondary | ICD-10-CM

## 2020-01-05 DIAGNOSIS — I4901 Ventricular fibrillation: Secondary | ICD-10-CM

## 2020-01-05 LAB — CBC
HCT: 41.2 % (ref 39.0–52.0)
Hemoglobin: 13.2 g/dL (ref 13.0–17.0)
MCH: 27.3 pg (ref 26.0–34.0)
MCHC: 32 g/dL (ref 30.0–36.0)
MCV: 85.1 fL (ref 80.0–100.0)
Platelets: 101 10*3/uL — ABNORMAL LOW (ref 150–400)
RBC: 4.84 MIL/uL (ref 4.22–5.81)
RDW: 16.1 % — ABNORMAL HIGH (ref 11.5–15.5)
WBC: 8.5 10*3/uL (ref 4.0–10.5)
nRBC: 0 % (ref 0.0–0.2)

## 2020-01-05 LAB — ECHOCARDIOGRAM COMPLETE
Area-P 1/2: 5.81 cm2
Calc EF: 41.2 %
Height: 71 in
MV M vel: 4.22 m/s
MV Peak grad: 71.2 mmHg
S' Lateral: 4.4 cm
Single Plane A2C EF: 48.2 %
Single Plane A4C EF: 32.9 %
Weight: 4116.43 oz

## 2020-01-05 LAB — GLUCOSE, CAPILLARY
Glucose-Capillary: 193 mg/dL — ABNORMAL HIGH (ref 70–99)
Glucose-Capillary: 194 mg/dL — ABNORMAL HIGH (ref 70–99)
Glucose-Capillary: 211 mg/dL — ABNORMAL HIGH (ref 70–99)
Glucose-Capillary: 228 mg/dL — ABNORMAL HIGH (ref 70–99)
Glucose-Capillary: 254 mg/dL — ABNORMAL HIGH (ref 70–99)
Glucose-Capillary: 273 mg/dL — ABNORMAL HIGH (ref 70–99)

## 2020-01-05 LAB — BASIC METABOLIC PANEL
Anion gap: 13 (ref 5–15)
BUN: 41 mg/dL — ABNORMAL HIGH (ref 8–23)
CO2: 24 mmol/L (ref 22–32)
Calcium: 8.3 mg/dL — ABNORMAL LOW (ref 8.9–10.3)
Chloride: 103 mmol/L (ref 98–111)
Creatinine, Ser: 2.07 mg/dL — ABNORMAL HIGH (ref 0.61–1.24)
GFR calc Af Amer: 38 mL/min — ABNORMAL LOW (ref >60)
GFR calc non Af Amer: 33 mL/min — ABNORMAL LOW (ref >60)
Glucose, Bld: 242 mg/dL — ABNORMAL HIGH (ref 70–99)
Potassium: 4 mmol/L (ref 3.5–5.1)
Sodium: 140 mmol/L (ref 135–145)

## 2020-01-05 LAB — LACTIC ACID, PLASMA: Lactic Acid, Venous: 2.1 mmol/L (ref 0.5–1.9)

## 2020-01-05 LAB — TRIGLYCERIDES: Triglycerides: 115 mg/dL (ref <150)

## 2020-01-05 LAB — HEPARIN LEVEL (UNFRACTIONATED): Heparin Unfractionated: 0.48 IU/mL (ref 0.30–0.70)

## 2020-01-05 LAB — PHOSPHORUS: Phosphorus: 2.7 mg/dL (ref 2.5–4.6)

## 2020-01-05 LAB — MAGNESIUM: Magnesium: 2.6 mg/dL — ABNORMAL HIGH (ref 1.7–2.4)

## 2020-01-05 MED ORDER — INSULIN ASPART 100 UNIT/ML ~~LOC~~ SOLN: 4.0000 [IU] | SUBCUTANEOUS | Status: DC

## 2020-01-05 MED ORDER — INSULIN ASPART 100 UNIT/ML ~~LOC~~ SOLN
4.0000 [IU] | SUBCUTANEOUS | Status: DC
  Administered 2020-01-05 – 2020-01-09 (×16): 4 [IU] via SUBCUTANEOUS

## 2020-01-05 MED ORDER — INSULIN GLARGINE 100 UNIT/ML ~~LOC~~ SOLN
5.0000 [IU] | Freq: Two times a day (BID) | SUBCUTANEOUS | Status: DC
  Administered 2020-01-05 – 2020-01-06 (×3): 5 [IU] via SUBCUTANEOUS
  Filled 2020-01-05 (×4): qty 0.05

## 2020-01-05 MED ORDER — LABETALOL HCL 5 MG/ML IV SOLN
10.0000 mg | INTRAVENOUS | Status: DC | PRN
  Administered 2020-01-05 (×2): 10 mg via INTRAVENOUS
  Filled 2020-01-05 (×2): qty 4

## 2020-01-05 MED ORDER — METOPROLOL TARTRATE 25 MG/10 ML ORAL SUSPENSION
25.0000 mg | Freq: Two times a day (BID) | ORAL | Status: DC
  Administered 2020-01-05 – 2020-01-06 (×3): 25 mg
  Filled 2020-01-05 (×3): qty 10

## 2020-01-05 MED ORDER — PERFLUTREN LIPID MICROSPHERE
1.0000 mL | INTRAVENOUS | Status: AC | PRN
  Administered 2020-01-05: 2 mL via INTRAVENOUS
  Filled 2020-01-05: qty 10

## 2020-01-05 NOTE — Progress Notes (Signed)
Inpatient Diabetes Program Recommendations  AACE/ADA: New Consensus Statement on Inpatient Glycemic Control (2015)  Target Ranges:  Prepandial:   less than 140 mg/dL      Peak postprandial:   less than 180 mg/dL (1-2 hours)      Critically ill patients:  140 - 180 mg/dL   Lab Results  Component Value Date   GLUCAP 254 (H) 01/05/2020   HGBA1C 7.5 (H) 01/02/2020    Review of Glycemic Control Results for JENCARLO, BONADONNA (MRN [id number]) as of 01/05/2020 12:13  Ref. Range 01/05/2020 00:04 01/05/2020 03:34 01/05/2020 07:58 01/05/2020 11:08  Glucose-Capillary Latest Ref Range: 70 - 99 mg/dL 729 (H) 021 (H) 115 (H) 254 (H)   Diabetes history: Type 2 DM Outpatient Diabetes medications: Farxiga 5 mg QD, Ozempic 0.5 mg q Thursday, Touejo 40 units QHS Current orders for Inpatient glycemic control: Lantus 5 units BID, Novolog 0-15 units Q4H  Inpatient Diabetes Program Recommendations:    Consider adding Novolog 3 units Q4H for tube feed coverage (to be stopped or held in the event tube feeds are stopped).   Thanks, Lujean Rave, MSN, RNC-OB Diabetes Coordinator [phone] (8a-5p)

## 2020-01-05 NOTE — Progress Notes (Signed)
NAME:  Colin [name], MRN:  [id number], DOB:  December 07, 1955, LOS: 3 ADMISSION DATE:  January 12, 2020, CONSULTATION DATE:  01/05/20 REFERRING MD:  EDP, CHIEF COMPLAINT:  Cardiac arrest   Brief History   64 year old male past medical history of A. fib/a flutter on Eliquis, cardiomyopathy, hypertension, obesity, OSA who had a witnessed out of hospital V. fib arrest with 1 hour of CPR before ROSC obtained.  Intubated and PCCM consulted for admission  History of present illness   Colin [name] is a 64 year old male with past medical history of A. fib/a flutter s/p ablation on Eliquis, dilated aortic root cardiomyopathy, hypertension, obesity, OSA who has been in his usual state of health recently, though his wife noted he seemed to be breathing heavier and was more tired than usual for about the last day.  She says he never complains, just went to bed early.  This evening she was talking to him when he collapsed in front of her.  She called 911 and started chest compressions.  EMS found patient in V-fib, he was intubated and defibrillated multiple times, given amiodarone and a total of 7 mg of epi was obtained, estimated 1 hour of ACLS.  On arrival to the ED, patient was unresponsive, head CT without acute findings.  EKG with new anterior ST depressions and troponin 1,600.  Cardiology was consulted and confirmed no STEMI. blood work significant for lactic acidosis and leukocytosis with AKI and elevated LFTs.  Covid-19 negative no other source of infection noted.  PCCM consulted for admission  Past Medical History   has a past medical history of Atrial flutter (HCC), DCM (dilated cardiomyopathy) (HCC), Dilated aortic root (HCC) (04/15/2015), Hypertension, Obesities, morbid (HCC), OSA (obstructive sleep apnea), and Persistent atrial fibrillation (HCC).   Significant Hospital Events   8/28 Admit to PCCM  Consults:  cardiology  Procedures:  8/27 ETT  Significant Diagnostic Tests:  8/27 CXR>>Central vascular  congestion consistent with the recent CPR. 8/28 CT head>>Mild age-related atrophy and chronic microvascular ischemic changes. Old right occipital infarct. 8/29 EEG > profound diffuse encephalopathy without seizure or epileptiform discharges.  Micro Data:  8/28 Sars-Cov-2>>negative  Antimicrobials:  Unasyn 8/28 >>   Interim history/subjective:  Remains on 100 fentanyl.  Has intermittent eye opening but no other response.  Objective   Blood pressure (!) 131/94, pulse (!) 104, temperature 99.9 F (37.7 C), resp. rate (!) 28, height 5\' 11"  (1.803 m), weight 116.7 kg, SpO2 97 %.    Vent Mode: PRVC FiO2 (%):  [40 %-60 %] 60 % Set Rate:  [28 bmp] 28 bmp Vt Set:  [530 mL] 530 mL PEEP:  [12 cmH20-16 cmH20] 12 cmH20 Plateau Pressure:  [21 cmH20-26 cmH20] 24 cmH20   Intake/Output Summary (Last 24 hours) at 01/05/2020 0729 Last data filed at 01/05/2020 0600 Gross per 24 hour  Intake 1920.23 ml  Output 1880 ml  Net 40.23 ml   Filed Weights   01-12-20 2333 01/04/20 0500 01/05/20 0500  Weight: 104.3 kg 114.8 kg 116.7 kg    General: Obese man, ventilated, ill-appearing HEENT: ETT in place, MM moist Neuro: Unresponsive despite no sedation, opens eyes intermittently CV: Regular, no murmur PULM: Coarse bilateral breath sounds with inspiratory crackles, no wheezing GI: Obese, nondistended, hypoactive bowel sounds present Extremities: Trace pretibial edema Skin: No rash   Assessment & Plan:   Acute respiratory failure with hypoxemia, ventilator dependence ARDS physiology, suspect aspiration pneumonia - Continue full vent support. - No weaning due to mental status. - Empiric Unasyn. -  Sedation as per PAD protocol. - Follow chest x-ray, ABG. - VAP prevention order set.  Witnessed, out of hospital V. fib cardiac arrest - 1 hour ACLS before ROSC, CT head negative.  Underlying cause unclear, possible non-STEMI.  Doubt PE since he is on chronic anticoagulation. - MRI brain today to help  with prognostication. - Normothermia.  NSTEMI, history hypertension, atrial fibrillation, cardiomyopathy Ventricular fibrillation, out of hospital cardiac arrest - Last echocardiogram with EF 55-60%. Initial troponin 1,600, appreciate cardiology recommendations. - Continue aspirin, heparin. - F/u on repeat echo. - Cards following.  HTN. - PRN labetalol.  Acute kidney injury, ATN due to arrest, nonoliguric 8/29 - improving daily with good UOP. - Continue supportive care. - Follow BMP.  Elevated LFTs, likely shock liver - gradually improving. - Follow coags, LFT.  Hyperglycemia. - SSI. - Add lantus.  Hypothyroidism. - Continue synthroid.   Best practice:  Diet: TF's Pain/Anxiety/Delirium protocol (if indicated): fentanyl VAP protocol (if indicated): HOB 30 degrees, suction as needed DVT prophylaxis: Heparin GI prophylaxis: Protonix Glucose control: SSI Mobility: Bedrest Code Status: Full code Family Communication: Will update wife at bedside once she arrives.  Family hoping for a miracle. Disposition: ICU   Critical care time: 35 minutes.    Rutherford Guys, [place] [facility] 01/05/2020, 7:29 AM

## 2020-01-05 NOTE — Progress Notes (Signed)
eLink Physician-Brief Progress Note Patient Name: Colin [name] DOB: [birth year] MRN: [id number]   Date of Service  01/05/2020  HPI/Events of Note  Patient with fever with temp up to 101.6, tylenol did not resolve the fever.  eICU Interventions  Cooling blanket ordered.        Thomasene Lot Colin [name] 01/05/2020, 10:55 PM

## 2020-01-05 NOTE — Progress Notes (Signed)
  Echocardiogram 2D Echocardiogram with definity has been performed.  Leta Jungling M 01/05/2020, 1:46 PM

## 2020-01-05 NOTE — Progress Notes (Signed)
eLink Physician-Brief Progress Note Patient Name: Colin [name] DOB: January 09, 1956 MRN: [id number]   Date of Service  01/05/2020  HPI/Events of Note  Patient with elevated blood pressure.  eICU Interventions  Labetalol 10 mg iv Q 2 hours prn SBP  170 mmHg.        Thomasene Lot Takuya Lariccia 01/05/2020, 1:51 AM

## 2020-01-05 NOTE — Progress Notes (Signed)
Progress Note  Patient Name: Colin [name] Date of Encounter: 01/05/2020  [facility] Cardiologist: Fransico Him, MD   Subjective   Intubated, unresponsive  Inpatient Medications    Scheduled Meds: . acetaminophen  650 mg Oral Q4H   Or  . acetaminophen (TYLENOL) oral liquid 160 mg/5 mL  650 mg Per Tube Q4H   Or  . acetaminophen  650 mg Rectal Q4H  . aspirin  324 mg Per Tube Daily  . atorvastatin  80 mg Per Tube Daily  . chlorhexidine gluconate (MEDLINE KIT)  15 mL Mouth Rinse BID  . Chlorhexidine Gluconate Cloth  6 each Topical Daily  . docusate  100 mg Per Tube BID  . feeding supplement (PROSource TF)  90 mL Per Tube TID  . feeding supplement (VITAL HIGH PROTEIN)  1,000 mL Per Tube Q24H  . insulin aspart  0-15 Units Subcutaneous Q4H  . insulin glargine  5 Units Subcutaneous BID  . levothyroxine  200 mcg Per Tube Once per day on Mon Tue Wed Thu Fri Sat   And  . [START ON 01/10/2020] levothyroxine  100 mcg Per Tube Once per day on Sun  . mouth rinse  15 mL Mouth Rinse 10 times per day  . metoprolol tartrate  25 mg Per Tube BID  . pantoprazole (PROTONIX) IV  40 mg Intravenous Daily  . sennosides  5 mL Per Tube Q1200  . sodium chloride flush  10-40 mL Intracatheter Q12H   Continuous Infusions: . sodium chloride    . amiodarone 30 mg/hr (01/05/20 1000)  . ampicillin-sulbactam (UNASYN) IV 3 g (01/05/20 0526)  . fentaNYL infusion INTRAVENOUS 100 mcg/hr (01/05/20 1000)  . heparin 1,300 Units/hr (01/05/20 1000)  . norepinephrine (LEVOPHED) Adult infusion Stopped (01/04/20 1720)  . propofol (DIPRIVAN) infusion 10 mcg/kg/min (01/05/20 1000)   PRN Meds: labetalol, polyethylene glycol, sodium chloride flush   Vital Signs    Vitals:   01/05/20 0747 01/05/20 0800 01/05/20 0900 01/05/20 1000  BP:  (!) 157/110 120/87 (!) 131/99  Pulse:  (!) 109 96 (!) 101  Resp:      Temp:  100 F (37.8 C) (!) 100.8 F (38.2 C) (!) 101.1 F (38.4 C)  TempSrc:  Bladder    SpO2: 94%  97% (!) 83% 90%  Weight:      Height:        Intake/Output Summary (Last 24 hours) at 01/05/2020 1044 Last data filed at 01/05/2020 1020 Gross per 24 hour  Intake 2122.1 ml  Output 2070 ml  Net 52.1 ml   Last 3 Weights 01/05/2020 01/04/2020 01/04/2020  Weight (lbs) 257 lb 4.4 oz 253 lb 1.4 oz 230 lb  Weight (kg) 116.7 kg 114.8 kg 104.327 kg      Telemetry    Atrial fibrillation, heart rate 100 to 110 bpm, occasional short ventricular runs- Personally Reviewed   Physical Exam  Intubated, unresponsive GEN: No acute distress.   Neck: No JVD Cardiac:  Irregularly irregular, no murmurs, rubs, or gallops.  Respiratory: Clear to auscultation bilaterally. GI: Soft, nontender, non-distended  MS:  Trace bilateral pedal and pretibial edema; No deformity. Neuro:  Unresponsive Psych: Unable to assess  Labs    High Sensitivity Troponin:   Recent Labs  Lab 12/22/2019 2333 01/02/20 0440  TROPONINIHS 1,600* >27,000*      Chemistry Recent Labs  Lab 01/02/20 0440 01/02/20 0510 01/03/20 0347 01/03/20 0357 01/03/20 2138 01/04/20 0402 01/05/20 0236  NA 137   < > 136   < >  140 140 140  K 3.7   < > 4.4   < > 4.2 4.2 4.0  CL 101   < > 104   < > 102 102 103  CO2 20*   < > 20*   < > '25 26 24  ' GLUCOSE 264*   < > 286*   < > 147* 169* 242*  BUN 19   < > 34*   < > 32* 34* 41*  CREATININE 2.40*   < > 3.06*   < > 2.43* 2.32* 2.07*  CALCIUM 8.9   < > 7.9*   < > 8.0* 7.8* 8.3*  PROT 6.4*  --  5.9*  --   --  5.6*  --   ALBUMIN 3.6  --  3.0*  --   --  2.7*  --   AST 332*  --  339*  --   --  213*  --   ALT 185*  --  142*  --   --  105*  --   ALKPHOS 118  --  71  --   --  74  --   BILITOT 1.2  --  1.4*  --   --  1.9*  --   GFRNONAA 28*   < > 21*   < > 27* 29* 33*  GFRAA 32*   < > 24*   < > 32* 33* 38*  ANIONGAP 16*   < > 12   < > '13 12 13   ' < > = values in this interval not displayed.     Hematology Recent Labs  Lab 01/03/20 0347 01/03/20 0347 01/03/20 0357 01/04/20 0402  01/05/20 0236  WBC 6.0  --   --  8.1 8.5  RBC 5.95*  --   --  5.08 4.84  HGB 16.1   < > 17.0 14.1 13.2  HCT 49.5   < > 50.0 43.0 41.2  MCV 83.2  --   --  84.6 85.1  MCH 27.1  --   --  27.8 27.3  MCHC 32.5  --   --  32.8 32.0  RDW 15.9*  --   --  15.9* 16.1*  PLT 131*  --   --  103* 101*   < > = values in this interval not displayed.    BNPNo results for input(s): BNP, PROBNP in the last 168 hours.   DDimer No results for input(s): DDIMER in the last 168 hours.   Radiology    DG Chest Port 1 View  Result Date: 01/05/2020 CLINICAL DATA:  Intubation.  Respiratory failure. EXAM: PORTABLE CHEST 1 VIEW COMPARISON:  01/04/2020. FINDINGS: Endotracheal tube, left IJ line, NG tube in stable position. Stable cardiomegaly. Diffuse bilateral pulmonary infiltrates/edema again noted. No significant interim change. No pleural effusion or pneumothorax. IMPRESSION: 1.  Lines and tubes in stable position. 2. Diffuse bilateral pulmonary infiltrates/edema again noted without interim change. 3.  Stable cardiomegaly. Electronically Signed   By: Marcello Moores  Register   On: 01/05/2020 06:18   DG Chest Port 1 View  Result Date: 01/04/2020 CLINICAL DATA:  Acute respiratory failure. Endotracheally intubated. EXAM: PORTABLE CHEST 1 VIEW COMPARISON:  01/03/2020 FINDINGS: Support lines and tubes in appropriate position. Bilateral pulmonary airspace disease is again seen which is greatest in the lung bases, without significant interval change. Cardiomegaly remains stable. IMPRESSION: No significant change in bilateral pulmonary airspace disease and cardiomegaly. Electronically Signed   By: Marlaine Hind M.D.   On: 01/04/2020 08:12   EEG adult  Result Date: 01/03/2020 Lora Havens, MD     01/03/2020  1:28 PM Patient Name: Colin [name] MRN: [id number] Epilepsy Attending: Lora Havens Referring Physician/Provider: Dr Baltazar Apo Date: 01/03/2020 Duration: 23.33 mins Patient history: 64yo m s/p cardiac arrest. EEG to  evaluate for seizure. Level of alertness: comatose AEDs during EEG study: Propofol Technical aspects: This EEG study was done with scalp electrodes positioned according to the 10-20 International system of electrode placement. Electrical activity was acquired at a sampling rate of '500Hz'  and reviewed with a high frequency filter of '70Hz'  and a low frequency filter of '1Hz' . EEG data were recorded continuously and digitally stored. Description: EEG showed continuous monomorphic generalized 5 to 6 Hz theta slowing with brief 1-2 seconds of generalized background attentuation.  Hyperventilation and photic stimulation were not performed.   ABNORMALITY -Continuous slow, generalized IMPRESSION: This study is suggestive of profound diffuse encephalopathy, nonspecific etiology but likely related to sedation, anoxic/hypoxic brain injury. No seizures or epileptiform discharges were seen throughout the recording. Lora Havens    Cardiac Studies   2D echo pending  Patient Profile     64 y.o. male with out of hospital V. fib cardiac arrest requiring 1 hour of ACLS before ROSC.  Assessment & Plan    1.  Non-STEMI: Troponin greater than 27,000, echo pending.  History of nonischemic cardiomyopathy that normalized on last year's echo study.  Continue IV heparin. 2.  Acute respiratory failure with hypoxemia: Patient ventilator dependent on full support, treated with empiric Unasyn.  Management per CCM team. 3.  Ventricular fibrillation: Considerations include LV dysfunction related to nonischemic cardiomyopathy versus ACS/non-STEMI.  Considering current situation, treatment is conservative with echo assessment today and ongoing supportive measures. 4.  Acute kidney injury, nonoliguric: Renal function improving, creatinine trend reviewed. 5.  Anoxic encephalopathy: Brain MRI today.  Prognosis guarded. 6.  Hypertension, uncontrolled: As needed antihypertensive medicines have been used.  Add metoprolol 25 mg twice  daily per tube. 7.  Atrial fibrillation: Continue amiodarone, add metoprolol, continue heparin for anticoagulation.      For questions or updates, please contact [place] Please consult [email or web address] for contact info under        Signed, Sherren Mocha, MD  01/05/2020, 10:44 AM

## 2020-01-05 NOTE — Progress Notes (Addendum)
ANTICOAGULATION CONSULT NOTE   Pharmacy Consult for Heparin (Apixaban on hold) Indication: chest pain/ACS and atrial fibrillation  No Known Allergies  Patient Measurements: Height: 5\' 11"  (180.3 cm) Weight: 116.7 kg (257 lb 4.4 oz) IBW/kg (Calculated) : 75.3  Vital Signs: Temp: 99.9 F (37.7 C) (08/31 0645) Temp Source: Bladder (08/31 0400) BP: 131/94 (08/31 0600) Pulse Rate: 104 (08/31 0645)  Labs: Recent Labs    01/03/20 0347 01/03/20 0347 01/03/20 0357 01/03/20 0357 01/03/20 0844 01/03/20 0844 01/03/20 1714 01/03/20 2138 01/04/20 0402 01/05/20 0236  HGB 16.1   < > 17.0   < >  --   --   --   --  14.1 13.2  HCT 49.5   < > 50.0  --   --   --   --   --  43.0 41.2  PLT 131*  --   --   --   --   --   --   --  103* 101*  APTT  --   --   --   --  41*  --  142*  --   --   --   HEPARINUNFRC  --   --   --   --  0.14*   < > 0.37  --  0.61 0.48  CREATININE 3.06*   < >  --   --   --   --   --  2.43* 2.32* 2.07*   < > = values in this interval not displayed.    Estimated Creatinine Clearance: 47.5 mL/min (A) (by C-G formula based on SCr of 2.07 mg/dL (H)).   Medical History: Past Medical History:  Diagnosis Date  . Atrial flutter (HCC)   . DCM (dilated cardiomyopathy) (HCC)    EF 35-40% years ago but echo a year ago showed normal LVF  . Dilated aortic root (HCC) 04/15/2015   20mm by echo 06/2017 and Chest CT 12/2016  . Hypertension   . Obesities, morbid (HCC)   . OSA (obstructive sleep apnea)   . Persistent atrial fibrillation [facility])    s/p TEE/DCCV now on Sotolol     Assessment: 64 y/o M with prolonged cardiac arrest s/p ROSC. Holding apixaban and starting heparin for afib/NSTEMI. Last apixaban dose was 8/27, but timing is unclear.  Pharmacy consulted to dose IV heparin.  Patient remains therapeutic on heparin with level today 0.48 after slight decrease in rate yesterday.  Will continue drip at current rate. CBC stable, no bleeding noted.   Goal of Therapy:   Heparin level 0.3-0.7 units/mL Monitor platelets by anticoagulation protocol: Yes   Plan:  Continue heparin at 1300 units/hr Monitor daily HL and CBC Monitor for signs/symptoms of bleeding  9/27, PharmD PGY-1 Acute Care Pharmacy Resident 01/05/2020 7:13 AM

## 2020-01-06 ENCOUNTER — Inpatient Hospital Stay (HOSPITAL_COMMUNITY): Payer: Commercial Managed Care - PPO

## 2020-01-06 ENCOUNTER — Other Ambulatory Visit: Payer: Self-pay

## 2020-01-06 LAB — POCT I-STAT 7, (LYTES, BLD GAS, ICA,H+H)
Acid-Base Excess: 1 mmol/L (ref 0.0–2.0)
Bicarbonate: 25.5 mmol/L (ref 20.0–28.0)
Calcium, Ion: 1.08 mmol/L — ABNORMAL LOW (ref 1.15–1.40)
HCT: 34 % — ABNORMAL LOW (ref 39.0–52.0)
Hemoglobin: 11.6 g/dL — ABNORMAL LOW (ref 13.0–17.0)
O2 Saturation: 91 %
Patient temperature: 101.7
Potassium: 4.1 mmol/L (ref 3.5–5.1)
Sodium: 140 mmol/L (ref 135–145)
TCO2: 27 mmol/L (ref 22–32)
pCO2 arterial: 41.9 mmHg (ref 32.0–48.0)
pH, Arterial: 7.399 (ref 7.350–7.450)
pO2, Arterial: 67 mmHg — ABNORMAL LOW (ref 83.0–108.0)

## 2020-01-06 LAB — CBC
HCT: 36.2 % — ABNORMAL LOW (ref 39.0–52.0)
Hemoglobin: 11.5 g/dL — ABNORMAL LOW (ref 13.0–17.0)
MCH: 27.2 pg (ref 26.0–34.0)
MCHC: 31.8 g/dL (ref 30.0–36.0)
MCV: 85.6 fL (ref 80.0–100.0)
Platelets: 87 10*3/uL — ABNORMAL LOW (ref 150–400)
RBC: 4.23 MIL/uL (ref 4.22–5.81)
RDW: 16.6 % — ABNORMAL HIGH (ref 11.5–15.5)
WBC: 6.5 10*3/uL (ref 4.0–10.5)
nRBC: 0 % (ref 0.0–0.2)

## 2020-01-06 LAB — GLUCOSE, CAPILLARY
Glucose-Capillary: 129 mg/dL — ABNORMAL HIGH (ref 70–99)
Glucose-Capillary: 136 mg/dL — ABNORMAL HIGH (ref 70–99)
Glucose-Capillary: 166 mg/dL — ABNORMAL HIGH (ref 70–99)
Glucose-Capillary: 178 mg/dL — ABNORMAL HIGH (ref 70–99)
Glucose-Capillary: 182 mg/dL — ABNORMAL HIGH (ref 70–99)
Glucose-Capillary: 219 mg/dL — ABNORMAL HIGH (ref 70–99)
Glucose-Capillary: 220 mg/dL — ABNORMAL HIGH (ref 70–99)

## 2020-01-06 LAB — BASIC METABOLIC PANEL
Anion gap: 12 (ref 5–15)
BUN: 68 mg/dL — ABNORMAL HIGH (ref 8–23)
CO2: 23 mmol/L (ref 22–32)
Calcium: 7.9 mg/dL — ABNORMAL LOW (ref 8.9–10.3)
Chloride: 102 mmol/L (ref 98–111)
Creatinine, Ser: 2.57 mg/dL — ABNORMAL HIGH (ref 0.61–1.24)
GFR calc Af Amer: 30 mL/min — ABNORMAL LOW (ref >60)
GFR calc non Af Amer: 25 mL/min — ABNORMAL LOW (ref >60)
Glucose, Bld: 232 mg/dL — ABNORMAL HIGH (ref 70–99)
Potassium: 3.6 mmol/L (ref 3.5–5.1)
Sodium: 137 mmol/L (ref 135–145)

## 2020-01-06 LAB — HEPARIN LEVEL (UNFRACTIONATED): Heparin Unfractionated: 0.5 IU/mL (ref 0.30–0.70)

## 2020-01-06 LAB — PHOSPHORUS: Phosphorus: 3.2 mg/dL (ref 2.5–4.6)

## 2020-01-06 LAB — TRIGLYCERIDES: Triglycerides: 95 mg/dL (ref <150)

## 2020-01-06 LAB — MAGNESIUM: Magnesium: 2.6 mg/dL — ABNORMAL HIGH (ref 1.7–2.4)

## 2020-01-06 LAB — TSH: TSH: 3.528 u[IU]/mL (ref 0.350–4.500)

## 2020-01-06 LAB — PROCALCITONIN: Procalcitonin: 28.16 ng/mL

## 2020-01-06 MED ORDER — MIDAZOLAM BOLUS VIA INFUSION
1.0000 mg | INTRAVENOUS | Status: DC | PRN
  Filled 2020-01-06: qty 2

## 2020-01-06 MED ORDER — GADOBUTROL 1 MMOL/ML IV SOLN
10.0000 mL | Freq: Once | INTRAVENOUS | Status: AC | PRN
  Administered 2020-01-06: 10 mL via INTRAVENOUS

## 2020-01-06 MED ORDER — MIDAZOLAM 50MG/50ML (1MG/ML) PREMIX INFUSION
0.5000 mg/h | INTRAVENOUS | Status: DC
Start: 2020-01-06 — End: 2020-01-06

## 2020-01-06 MED ORDER — PROPRANOLOL HCL 40 MG PO TABS
40.0000 mg | ORAL_TABLET | Freq: Four times a day (QID) | ORAL | Status: DC
  Administered 2020-01-06 – 2020-01-07 (×3): 40 mg via ORAL
  Filled 2020-01-06 (×7): qty 1

## 2020-01-06 MED ORDER — MIDAZOLAM 50MG/50ML (1MG/ML) PREMIX INFUSION
0.0000 mg/h | INTRAVENOUS | Status: DC
  Administered 2020-01-06: 2 mg/h via INTRAVENOUS
  Administered 2020-01-07: 3 mg/h via INTRAVENOUS
  Filled 2020-01-06 (×2): qty 50

## 2020-01-06 MED ORDER — ROCURONIUM BROMIDE 10 MG/ML (PF) SYRINGE
PREFILLED_SYRINGE | INTRAVENOUS | Status: AC
  Administered 2020-01-06: 50 mg
  Filled 2020-01-06: qty 10

## 2020-01-06 MED ORDER — POTASSIUM CHLORIDE 10 MEQ/50ML IV SOLN
10.0000 meq | INTRAVENOUS | Status: AC
  Administered 2020-01-06 (×2): 10 meq via INTRAVENOUS
  Filled 2020-01-06 (×2): qty 50

## 2020-01-06 MED ORDER — CLEVIDIPINE BUTYRATE 0.5 MG/ML IV EMUL
0.0000 mg/h | INTRAVENOUS | Status: DC
  Administered 2020-01-06: 1 mg/h via INTRAVENOUS
  Filled 2020-01-06: qty 50

## 2020-01-06 MED ORDER — ROCURONIUM BROMIDE 50 MG/5ML IV SOLN
50.0000 mg | Freq: Once | INTRAVENOUS | Status: AC
  Filled 2020-01-06: qty 5

## 2020-01-06 MED ORDER — POLYETHYLENE GLYCOL 3350 17 G PO PACK: 17.0000 g | PACK | Freq: Every day | ORAL | Status: DC

## 2020-01-06 MED ORDER — ORAL CARE MOUTH RINSE
15.0000 mL | OROMUCOSAL | Status: DC
  Administered 2020-01-06 – 2020-01-08 (×20): 15 mL via OROMUCOSAL

## 2020-01-06 MED ORDER — INSULIN GLARGINE 100 UNIT/ML ~~LOC~~ SOLN
10.0000 [IU] | Freq: Two times a day (BID) | SUBCUTANEOUS | Status: DC
  Administered 2020-01-06 – 2020-01-09 (×5): 10 [IU] via SUBCUTANEOUS
  Filled 2020-01-06 (×7): qty 0.1

## 2020-01-06 NOTE — Progress Notes (Signed)
Physician notified: Katrinka Blazing At: 1720  Regarding: cuff pressure 102 sys. Art line pressure 175sys, starting cleviprex per order.  Awaiting return response.   Returned Response at: 1721 to bedside  Order(s): MD increased fentanyl gtt to . Utilize arterial pressure to titrate cleviprex. Can start versed gtt tonight if needed for support.    Physician notified: Katrinka Blazing At: 1858  Regarding: Versed started at 4mg /hr. Fentanyl maxed at , propofol max at . SpO2 84, RT has suctioned and lavage- bloody plugs. FiO2 increased to 100% from 60.  Awaiting return response.   Returned Response at: 1858  Order(s): Give 50mg  Rocuronium x1 now.  Roc given at , witnessed by , [employer].

## 2020-01-06 NOTE — Consult Note (Addendum)
Neurology Consultation  Reason for Consult: Stroke found on MRI Referring Physician: Erskine Emery, PCCM  CC: Stroke found on MRI  History is obtained from: Chart  HPI: Colin [name] is a 64 y.o. male with past for history of atrial fibrillation/atrial flutter status post ablation on Eliquis, dilated aortic root cardiomyopathy, hypertension, obstructive sleep apnea who has been at his usual state of health until his wife noted him breathing heavier and he was more tired than usual.  On the evening of 01/02/2020 patient collapsed in front of her.  She called 911 started chest compressions.  EMS found patient to be in V. fib.  It was estimated that 1 hour of ACLS was obtained prior to ROSC.  Patient's target was normothermia at that time.  During work-up to evaluate for hypoxic injury it was noted that patient had a right PCA infarct.  Thus neurology was consulted.  Currently patient is on propofol however every time propofol is decreased or taken off patient becomes tachypneic and blood pressure becomes extremely elevated.  Labs of importance on arrival: Hemoglobin A1c 7.5 AST 332 ALT 185 BUN 34 Creatinine 3.21  LKW: Unknown last known normal tpa given?: no, unknown last known normal Premorbid modified Rankin scale (mRS): 0 NIHSS 1a Level of Conscious.: 2 1b LOC Questions: 2 1c LOC Commands: 2 2 Best Gaze: 0 3 Visual: 3 4 Facial Palsy: 2 5a Motor Arm - left: 4 5b Motor Arm - Right:4  6a Motor Leg - Left: 4 6b Motor Leg - Right: 4 7 Limb Ataxia: 2 8 Sensory: 2 9 Best Language: 0 10 Dysarthria: 0 11 Extinct. and Inatten.: 0 TOTAL: 28   Past Medical History:  Diagnosis Date   Atrial flutter ([place])    DCM (dilated cardiomyopathy) ([place])    EF 35-40% years ago but echo a year ago showed normal LVF   Dilated aortic root ([place]) 04/15/2015   27m by echo 06/2017 and Chest CT 12/2016   Hypertension    Obesities, morbid ([facility])    OSA (obstructive sleep apnea)    Persistent atrial  fibrillation ([facility])    s/p TEE/DCCV now on Sotolol    Family History  Problem Relation Age of Onset   Stroke Mother    Heart Problems Father        weak heart   Heart Problems Paternal Grandfather        pacemaker   Social History:   reports that he has quit smoking. His smoking use included cigarettes. He has a 7.50 pack-year smoking history. He has never used smokeless tobacco. He reports that he does not drink alcohol and does not use drugs.  Medications  Current Facility-Administered Medications:    0.9 %  sodium chloride infusion, 250 mL, Intravenous, Continuous, Gleason, LOtilio Carpen PA-C   acetaminophen (TYLENOL) tablet 650 mg, 650 mg, Oral, Q4H, 650 mg at 01/02/20 0753 **OR** acetaminophen (TYLENOL) 160 MG/5ML solution 650 mg, 650 mg, Per Tube, Q4H, 650 mg at 01/06/20 0945 **OR** acetaminophen (TYLENOL) suppository 650 mg, 650 mg, Rectal, Q4H, Gleason, LOtilio Carpen PA-C, 650 mg at 01/02/20 0407   [COMPLETED] amiodarone (NEXTERONE) 1.8 mg/mL load via infusion 150 mg, 150 mg, Intravenous, Once, 150 mg at 01/04/20 1110 **FOLLOWED BY** [EXPIRED] amiodarone (NEXTERONE PREMIX) 360-4.14 MG/200ML-% (1.8 mg/mL) IV infusion, 60 mg/hr, Intravenous, Continuous, Last Rate: 33.3 mL/hr at 01/04/20 1600, 60 mg/hr at 01/04/20 1600 **FOLLOWED BY** amiodarone (NEXTERONE PREMIX) 360-4.14 MG/200ML-% (1.8 mg/mL) IV infusion, 30 mg/hr, Intravenous, Continuous, SCandee Furbish MD, Last  Rate: 16.67 mL/hr at 01/06/20 1000, 30 mg/hr at 01/06/20 1000   Ampicillin-Sulbactam (UNASYN) 3 g in sodium chloride 0.9 % 100 mL IVPB, 3 g, Intravenous, Q8H, Dimple Nanas, RPH, Last Rate: 200 mL/hr at 01/06/20 1323, 3 g at 01/06/20 1323   aspirin chewable tablet 324 mg, 324 mg, Per Tube, Daily, Dimple Nanas, RPH, 324 mg at 01/06/20 0944   atorvastatin (LIPITOR) tablet 80 mg, 80 mg, Per Tube, Daily, Dimple Nanas, RPH, 80 mg at 01/06/20 0944   chlorhexidine gluconate (MEDLINE KIT) (PERIDEX) 0.12 % solution 15 mL,  15 mL, Mouth Rinse, BID, Byrum, Rose Fillers, MD, 15 mL at 01/06/20 0800   Chlorhexidine Gluconate Cloth 2 % PADS 6 each, 6 each, Topical, Daily, Collene Gobble, MD, 6 each at 01/06/20 0950   clevidipine (CLEVIPREX) infusion 0.5 mg/mL, 0-21 mg/hr, Intravenous, Continuous, Candee Furbish, MD   docusate (COLACE) 50 MG/5ML liquid 100 mg, 100 mg, Per Tube, BID, Dimple Nanas, RPH, 100 mg at 01/06/20 0943   feeding supplement (PROSource TF) liquid 90 mL, 90 mL, Per Tube, TID, Candee Furbish, MD, 90 mL at 01/06/20 0943   feeding supplement (VITAL HIGH PROTEIN) liquid 1,000 mL, 1,000 mL, Per Tube, Q24H, Candee Furbish, MD, 1,000 mL at 01/06/20 1159   fentaNYL 2531mg in NS 2514m(1034mml) infusion-PREMIX, 0-400 mcg/hr, Intravenous, Continuous, Gleason, LauOtilio CarpenA-C, Last Rate: 15 mL/hr at 01/06/20 1000, 150 mcg/hr at 01/06/20 1000   heparin ADULT infusion 100 units/mL (25000 units/250m72mdium chloride 0.45%), 1,300 Units/hr, Intravenous, Continuous, TuckDimple NanasH, Last Rate: 13 mL/hr at 01/06/20 1000, 1,300 Units/hr at 01/06/20 1000   insulin aspart (novoLOG) injection 0-15 Units, 0-15 Units, Subcutaneous, Q4H, Gleason, LaurOtilio Carpen-C, 5 Units at 01/06/20 1154   insulin aspart (novoLOG) injection 4 Units, 4 Units, Subcutaneous, Q4H, SmitCandee Furbish, 4 Units at 01/06/20 1155   insulin glargine (LANTUS) injection 10 Units, 10 Units, Subcutaneous, BID, SmitCandee Furbish   labetalol (NORMODYNE) injection 10 mg, 10 mg, Intravenous, Q2H PRN, OganFrederik Pear, 10 mg at 01/05/20 [id number] (SYNTHROID) tablet 200 mcg, 200 mcg, Per Tube, Once per day on Mon Tue Wed Thu Fri Sat, 200 mcg at 01/05/20 [id number]** [START ON 01/10/2020] levothyroxine (SYNTHROID) tablet 100 mcg, 100 mcg, Per Tube, Once per day on Sun, Tucker, Mary-Ashlyn, RPH [facility] mouth rinse, 15 mL, Mouth Rinse, 10 times per day, ByruCollene Gobble, 15 mL at 01/06/20 1205   pantoprazole (PROTONIX) injection 40 mg,  40 mg, Intravenous, Daily, Gleason, LaurOtilio Carpen-C, 40 mg at 01/06/20 0942   polyethylene glycol (MIRALAX / GLYCOLAX) packet 17 g, 17 g, Per Tube, Daily PRN, TuckDimple NanasH   propofol (DIPRIVAN) 1000 MG/100ML infusion, 0-50 mcg/kg/min, Intravenous, Continuous, Gleason, LaurOtilio Carpen-C, Last Rate: 31.3 mL/hr at 01/06/20 1255, 50 mcg/kg/min at 01/06/20 1255   propranolol (INDERAL) tablet 40 mg, 40 mg, Oral, QID, SmitCandee Furbish   sennosides (SENOKOT) 8.8 MG/5ML syrup 5 mL, 5 mL, Per Tube, Q1200, TuckDimple NanasH, 5 mL at 01/06/20 1157   sodium chloride flush (NS) 0.9 % injection 10-40 mL, 10-40 mL, Intracatheter, Q12H, Byrum, RobeRose Fillers, 10 mL at 01/05/20 2105   sodium chloride flush (NS) 0.9 % injection 10-40 mL, 10-40 mL, Intracatheter, PRN, ByruCollene Gobble  ROS: Unable to obtain due to altered mental status.    Exam: Current vital signs: BP 122/84   Pulse 99   Temp (!)Marland Kitchen  101.7 F (38.7 C)   Resp (!) 34   Ht _0  (1.803 m)   Wt 117.9 kg   SpO2 96%   BMI 36.25 kg/m  Vital signs in last 24 hours: Temp:  [100.2 F (37.9 C)-101.7 F (38.7 C)] 101.7 F (38.7 C) (09/01 1000) Pulse Rate:  [69-118] 99 (09/01 1000) Resp:  [2-38] 34 (09/01 1000) BP: (94-123)/(64-89) 122/84 (09/01 1000) SpO2:  [74 %-100 %] 96 % (09/01 1000) Arterial Line BP: (105-176)/(56-87) 176/75 (09/01 1000) FiO2 (%):  [60 %] 60 % (09/01 0732) Weight:  [117.9 kg] 117.9 kg (09/01 0100)   Constitutional: Appears well-developed and well-nourished.  Eyes: No scleral injection HENT: No OP obstrucion Head: Normocephalic.  Cardiovascular: Palpable Respiratory: Effort normal, non-labored breathing GI: Soft.  No distension. There is no tenderness.  Skin: WDI  Neuro:-Unable to get good exam secondary to patient being on propofol and unable to wean off propofol secondary to becoming hypertensive and tachypneic. Mental Status: Patient does not respond to verbal stimuli.  Does not respond to deep  sternal rub.  Does not follow commands.  No verbalizations noted.  Intubated breathing over the vent Cranial Nerves: II: patient does not respond confrontation bilaterally,  III,IV,VI: doll's response absent bilaterally. pupils right 1 mm, left 1 mm,and nonreactive bilaterally V,VII: corneal reflex present bilaterally  VIII: patient does not respond to verbal stimuli IX,X: gag reflex absent, XI: trapezius strength unable to test bilaterally XII: tongue strength unable to test Motor: Extremities flaccid throughout.  No spontaneous movement noted.  No purposeful movements noted. Sensory: Does not respond to noxious stimuli in any extremity. Deep Tendon Reflexes:  Absent throughout. Plantars: absent bilaterally Cerebellar: Unable to perform    Labs I have reviewed labs in epic and the results pertinent to this consultation are:   CBC    Component Value Date/Time   WBC 6.5 01/06/2020 0315   RBC 4.23 01/06/2020 0315   HGB 11.6 (L) 01/06/2020 1150   HGB 15.6 11/05/2019 1721   HCT 34.0 (L) 01/06/2020 1150   HCT 48.9 11/05/2019 1721   PLT 87 (L) 01/06/2020 0315   PLT 221 11/05/2019 1721   MCV 85.6 01/06/2020 0315   MCV 85 11/05/2019 1721   MCH 27.2 01/06/2020 0315   MCHC 31.8 01/06/2020 0315   RDW 16.6 (H) 01/06/2020 0315   RDW 15.2 11/05/2019 1721   LYMPHSABS 8.0 (H) 12/24/2019 2333   LYMPHSABS 1.7 07/09/2017 1113   MONOABS 0.8 12/09/2019 2333   EOSABS 0.2 12/22/2019 2333   EOSABS 0.1 07/09/2017 1113   BASOSABS 0.2 (H) 12/19/2019 2333   BASOSABS 0.0 07/09/2017 1113    CMP     Component Value Date/Time   NA 140 01/06/2020 1150   NA 141 11/05/2019 1721   K 4.1 01/06/2020 1150   CL 102 01/06/2020 0315   CO2 23 01/06/2020 0315   GLUCOSE 232 (H) 01/06/2020 0315   BUN 68 (H) 01/06/2020 0315   BUN 14 11/05/2019 1721   CREATININE 2.57 (H) 01/06/2020 0315   CREATININE 1.19 04/15/2015 1608   CALCIUM 7.9 (L) 01/06/2020 0315   PROT 5.6 (L) 01/04/2020 0402   PROT 7.0  12/08/2018 1627   ALBUMIN 2.7 (L) 01/04/2020 0402   ALBUMIN 4.5 11/05/2019 1721   AST 213 (H) 01/04/2020 0402   ALT 105 (H) 01/04/2020 0402   ALKPHOS 74 01/04/2020 0402   BILITOT 1.9 (H) 01/04/2020 0402   BILITOT 0.4 12/08/2018 1627   GFRNONAA 25 (L) 01/06/2020 0315   GFRAA  30 (L) 01/06/2020 0315    Lipid Panel     Component Value Date/Time   CHOL 130 12/10/2019 1547   TRIG 95 01/06/2020 0315   HDL 38 (L) 12/10/2019 1547   CHOLHDL 3.4 12/10/2019 1547   LDLCALC 77 12/10/2019 1547     Imaging I have reviewed the images obtained:  CT-scan of the brain-no acute intracranial pathology.  MRI examination of the brain-large right PCA territory acute infarct.  No hemorrhage or mass-effect.  Absent right PCA flow void consistent with occlusion.  Small foci of acute ischemia in the left cerebellum and left occipital lobe  Echocardiogram-left ventricular ejection fraction estimated to be 35% with left ventricle having moderately decreased function and demonstrating global hypokinesis.  Etta Quill PA-C [facility] [phone]  M-F  (9:00 am- 5:00 PM)  01/06/2020, 1:39 PM     Assessment: 64 year old male presented to the hospital after cardiac arrest with downtime approximately 1 hour.  Currently patient is exam shows only corneal reflex and breathing over the ventilator.  Patient is on high dose of propofol as he is unable to be weaned without becoming hypotensive and tachycardic.  MRI shows as above a large right PCA territory acute infarct.  Infarct etiology secondary to occlusion of PCA.  Impression: -Large right PCA territory acute infarct  Recommend -MRI of the brain without contrast -MRA Head and neck   -hold off on Anticoagulation, time to resume per Stroke team rounds tomorrow. -Start or continue Atorvastatin 80 mg/other high intensity statin -BP goal: gradual normotension -HBAIC and Lipid profile -Telemetry monitoring -Frequent neuro checks -NPO until  passes stroke swallow screen -PT/OT - recommend weaning off propofol and Fentanyl # please page stroke NP  Or  PA  Or MD from 8am -4 pm  as this patient from this time will be  followed by the stroke.   You can look them up on [email or web address]  Password [email or web address]

## 2020-01-06 NOTE — Progress Notes (Signed)
NAME:  Colin [name], MRN:  [id number], DOB:  1955-06-07, LOS: 4 ADMISSION DATE:  12/20/2019, CONSULTATION DATE:  01/06/20 REFERRING MD:  EDP, CHIEF COMPLAINT:  Cardiac arrest   Brief History   64 year old male past medical history of A. fib/a flutter on Eliquis, cardiomyopathy, hypertension, obesity, OSA who had a witnessed out of hospital V. fib arrest with 1 hour of CPR before ROSC obtained.  Intubated and PCCM consulted for admission  History of present illness   Colin [name] is a 64 year old male with past medical history of A. fib/a flutter s/p ablation on Eliquis, dilated aortic root cardiomyopathy, hypertension, obesity, OSA who has been in his usual state of health recently, though his wife noted he seemed to be breathing heavier and was more tired than usual for about the last day.  She says he never complains, just went to bed early.  This evening she was talking to him when he collapsed in front of her.  She called 911 and started chest compressions.  EMS found patient in V-fib, he was intubated and defibrillated multiple times, given amiodarone and a total of 7 mg of epi was obtained, estimated 1 hour of ACLS.  On arrival to the ED, patient was unresponsive, head CT without acute findings.  EKG with new anterior ST depressions and troponin 1,600.  Cardiology was consulted and confirmed no STEMI. blood work significant for lactic acidosis and leukocytosis with AKI and elevated LFTs.  Covid-19 negative no other source of infection noted.  PCCM consulted for admission  Past Medical History   has a past medical history of Atrial flutter ([facility]), DCM (dilated cardiomyopathy) ([facility]), Dilated aortic root ([facility]) (04/15/2015), Hypertension, Obesities, morbid (HCC), OSA (obstructive sleep apnea), and Persistent atrial fibrillation ([facility]).   Significant Hospital Events   8/28 Admit to PCCM  Consults:  Cardiology, neuro.  Procedures:  8/27 ETT  Significant Diagnostic Tests:  8/27 CXR>>Central  vascular congestion consistent with the recent CPR. 8/28 CT head>>Mild age-related atrophy and chronic microvascular ischemic changes. Old right occipital infarct. 8/29 EEG > profound diffuse encephalopathy without seizure or epileptiform discharges. 8/31 Echo > EF 35%, mildly reduced RV function. 8/31 Brain MRI > large rigtht PCA territory infarct.  Absent right PCA flow c/w occlusion, small acute ischemia in left cerebellum and left occipital lobes.  Micro Data:  8/28 Sars-Cov-2>>negative  Antimicrobials:  Unasyn 8/28 >>   Interim history/subjective:  Remains on propofol and fentanyl. Unresponsive.  Objective   Blood pressure 103/77, pulse 93, temperature (!) 100.9 F (38.3 C), resp. rate (!) 33, height 5\' 11"  (1.803 m), weight 117.9 kg, SpO2 96 %.    Vent Mode: PRVC FiO2 (%):  [60 %] 60 % Set Rate:  [28 bmp] 28 bmp Vt Set:  [530 mL] 530 mL PEEP:  [12 cmH20] 12 cmH20 Plateau Pressure:  [22 cmH20-30 cmH20] 30 cmH20   Intake/Output Summary (Last 24 hours) at 01/06/2020 0803 Last data filed at 01/06/2020 0700 Gross per 24 hour  Intake 2552.41 ml  Output 1525 ml  Net 1027.41 ml   Filed Weights   01/04/20 0500 01/05/20 0500 01/06/20 0100  Weight: 114.8 kg 116.7 kg 117.9 kg    General: Obese man, ventilated, ill-appearing HEENT: ETT in place, MM moist Neuro: Unresponsive despite no sedation, opens eyes intermittently CV: Regular, no murmur PULM: Coarse bilateral breath sounds with inspiratory crackles, no wheezing GI: Obese, nondistended, hypoactive bowel sounds present Extremities: No deformities, no edema Skin: No rash   Assessment & Plan:  Acute respiratory failure with hypoxemia, ventilator dependence ARDS physiology, suspect aspiration pneumonia - Continue full vent support. - No weaning due to mental status. - Empiric Unasyn through 9/3 for 7 days. - Sedation as per PAD protocol. - Follow chest x-ray, ABG. - VAP prevention order set.  Witnessed, out of  hospital V. fib cardiac arrest - 1 hour ACLS before ROSC, CT head negative.  Underlying cause unclear, possible non-STEMI.  Doubt PE since he is on chronic anticoagulation. MRI did not suggest anoxic encephalopathy as suspected but did reveal acute infarcts (see below). - Normothermia.  NSTEMI, history hypertension, atrial fibrillation, cardiomyopathy.  New echo 8/31 with EF 35 %. - Continue aspirin, heparin. - Cards following.  HTN. - Metoprolol. - PRN labetalol.  A.fib. - Continue amio, heparin, metoprolol.  Acute kidney injury, ATN due to arrest, nonoliguric. - Continue supportive care. - Follow BMP.  Elevated LFTs, likely shock liver - gradually improving. - Follow coags, LFT. - Caution with amio / statin / APAP.  Hyperglycemia. - SSI, lantus.  Hypothyroidism. - Continue synthroid.  Right PCA infarct with absent right PCA flow, left cerebellar and occipital lobe infarcts. - Consult neuro.  Fever - consider neuro storm given his intermittent tachycardia/tachypnea/fever/etc, but not convinced at this point. - Cooling blanket, tylenol. - Defer to neuro.   Best practice:  Diet: TF's Pain/Anxiety/Delirium protocol (if indicated): fentanyl gtt / propofol gtt/ VAP protocol (if indicated): HOB 30 degrees, suction as needed DVT prophylaxis: Heparin GI prophylaxis: Protonix Glucose control: SSI Mobility: Bedrest Code Status: Full code Family Communication: Will continue to update wife at bedside once she arrives.  Family hoping for a miracle. Disposition: ICU   Critical care time: 35 minutes.    Rutherford Guys, [place] [facility] 01/06/2020, 8:03 AM

## 2020-01-06 NOTE — Progress Notes (Signed)
ANTICOAGULATION CONSULT NOTE   Pharmacy Consult for Heparin (Apixaban on hold) Indication: chest pain/ACS and atrial fibrillation  No Known Allergies  Patient Measurements: Height: 5\' 11"  (180.3 cm) Weight: 117.9 kg (259 lb 14.8 oz) IBW/kg (Calculated) : 75.3  Vital Signs: Temp: 100.9 F (38.3 C) (09/01 0731) Temp Source: Bladder (09/01 0400) BP: 103/77 (09/01 0731) Pulse Rate: 93 (09/01 0731)  Labs: Recent Labs    01/03/20 1714 01/03/20 2138 01/04/20 0402 01/04/20 0402 01/05/20 0236 01/06/20 0315  HGB  --   --  14.1   < > 13.2 11.5*  HCT  --   --  43.0  --  41.2 36.2*  PLT  --   --  103*  --  101* 87*  APTT 142*  --   --   --   --   --   HEPARINUNFRC 0.37  --  0.61  --  0.48 0.50  CREATININE  --    < > 2.32*  --  2.07* 2.57*   < > = values in this interval not displayed.    Estimated Creatinine Clearance: 38.4 mL/min (A) (by C-G formula based on SCr of 2.57 mg/dL (H)).   Medical History: Past Medical History:  Diagnosis Date  . Atrial flutter (HCC)   . DCM (dilated cardiomyopathy) ([facility])    EF 35-40% years ago but echo a year ago showed normal LVF  . Dilated aortic root (HCC) 04/15/2015   43mm by echo 06/2017 and Chest CT 12/2016  . Hypertension   . Obesities, morbid (HCC)   . OSA (obstructive sleep apnea)   . Persistent atrial fibrillation [facility])    s/p TEE/DCCV now on Sotolol     Assessment: 64 y/o M with prolonged cardiac arrest s/p ROSC. Holding apixaban and starting heparin for afib/NSTEMI. Last apixaban dose was 8/27.  Pharmacy consulted to dose IV heparin.   Patient remains therapeutic on heparin with level today 0.50. CBC low, drop in Hg 1.7 since yesterday, no bleeding noted.   Goal of Therapy:  Heparin level 0.3-0.7 units/mL Monitor platelets by anticoagulation protocol: Yes   Plan:  Continue heparin at 1300 units/hr Monitor daily HL and CBC Monitor for signs/symptoms of bleeding  9/27, PharmD PGY1 Pharmacy Resident 01/06/2020 9:52  AM

## 2020-01-06 NOTE — Progress Notes (Signed)
Patient transported from 2H06 to MRI and back with no complications.

## 2020-01-06 NOTE — Progress Notes (Signed)
Progress Note  Patient Name: Colin [name] Date of Encounter: 01/06/2020  [facility] Cardiologist: Fransico Him, MD   Subjective   Pt intubated. Not responsive  Inpatient Medications    Scheduled Meds: . acetaminophen  650 mg Oral Q4H   Or  . acetaminophen (TYLENOL) oral liquid 160 mg/5 mL  650 mg Per Tube Q4H   Or  . acetaminophen  650 mg Rectal Q4H  . aspirin  324 mg Per Tube Daily  . atorvastatin  80 mg Per Tube Daily  . chlorhexidine gluconate (MEDLINE KIT)  15 mL Mouth Rinse BID  . Chlorhexidine Gluconate Cloth  6 each Topical Daily  . docusate  100 mg Per Tube BID  . feeding supplement (PROSource TF)  90 mL Per Tube TID  . feeding supplement (VITAL HIGH PROTEIN)  1,000 mL Per Tube Q24H  . insulin aspart  0-15 Units Subcutaneous Q4H  . insulin aspart  4 Units Subcutaneous Q4H  . insulin glargine  5 Units Subcutaneous BID  . levothyroxine  200 mcg Per Tube Once per day on Mon Tue Wed Thu Fri Sat   And  . [START ON 01/10/2020] levothyroxine  100 mcg Per Tube Once per day on Sun  . mouth rinse  15 mL Mouth Rinse 10 times per day  . metoprolol tartrate  25 mg Per Tube BID  . pantoprazole (PROTONIX) IV  40 mg Intravenous Daily  . sennosides  5 mL Per Tube Q1200  . sodium chloride flush  10-40 mL Intracatheter Q12H   Continuous Infusions: . sodium chloride    . amiodarone 30 mg/hr (01/06/20 0700)  . ampicillin-sulbactam (UNASYN) IV 3 g (01/06/20 0524)  . fentaNYL infusion INTRAVENOUS 150 mcg/hr (01/06/20 0700)  . heparin 1,300 Units/hr (01/06/20 0700)  . norepinephrine (LEVOPHED) Adult infusion Stopped (01/04/20 1720)  . potassium chloride    . propofol (DIPRIVAN) infusion 25 mcg/kg/min (01/06/20 0700)   PRN Meds: labetalol, polyethylene glycol, sodium chloride flush   Vital Signs    Vitals:   01/06/20 0600 01/06/20 0700 01/06/20 0731 01/06/20 0732  BP: 113/64 103/77 103/77   Pulse:   93   Resp: (!) 30 (!) 29 (!) 33   Temp: (!) 100.4 F (38 C) (!) 100.8  F (38.2 C) (!) 100.9 F (38.3 C)   TempSrc:      SpO2:   96% 96%  Weight:      Height:        Intake/Output Summary (Last 24 hours) at 01/06/2020 0742 Last data filed at 01/06/2020 0700 Gross per 24 hour  Intake 2676.74 ml  Output 1525 ml  Net 1151.74 ml   Last 3 Weights 01/06/2020 01/05/2020 01/04/2020  Weight (lbs) 259 lb 14.8 oz 257 lb 4.4 oz 253 lb 1.4 oz  Weight (kg) 117.9 kg 116.7 kg 114.8 kg      Telemetry    Atrial fib - Personally Reviewed   Physical Exam   General: Well developed, well nourished, not responsive, intubated SKIN: warm, dry. No rashes. Neuro: sedated, not responsive Musculoskeletal: unable to assess Psychiatric: unable to assess Lungs:Clear bilaterally, no wheezes, rhonci, crackles Cardiovascular: irreg irreg. No murmurs, gallops or rubs. Abdomen:Soft. Extremities: Trace to 1+ bilateral LE edema  Labs    High Sensitivity Troponin:   Recent Labs  Lab 12/24/2019 2333 01/02/20 0440  TROPONINIHS 1,600* >27,000*      Chemistry Recent Labs  Lab 01/02/20 0440 01/02/20 0510 01/03/20 0347 01/03/20 0357 01/04/20 0402 01/05/20 0236 01/06/20 0315  NA  137   < > 136   < > 140 140 137  K 3.7   < > 4.4   < > 4.2 4.0 3.6  CL 101   < > 104   < > 102 103 102  CO2 20*   < > 20*   < > '26 24 23  ' GLUCOSE 264*   < > 286*   < > 169* 242* 232*  BUN 19   < > 34*   < > 34* 41* 68*  CREATININE 2.40*   < > 3.06*   < > 2.32* 2.07* 2.57*  CALCIUM 8.9   < > 7.9*   < > 7.8* 8.3* 7.9*  PROT 6.4*  --  5.9*  --  5.6*  --   --   ALBUMIN 3.6  --  3.0*  --  2.7*  --   --   AST 332*  --  339*  --  213*  --   --   ALT 185*  --  142*  --  105*  --   --   ALKPHOS 118  --  71  --  74  --   --   BILITOT 1.2  --  1.4*  --  1.9*  --   --   GFRNONAA 28*   < > 21*   < > 29* 33* 25*  GFRAA 32*   < > 24*   < > 33* 38* 30*  ANIONGAP 16*   < > 12   < > '12 13 12   ' < > = values in this interval not displayed.     Hematology Recent Labs  Lab 01/04/20 0402 01/05/20 0236  01/06/20 0315  WBC 8.1 8.5 6.5  RBC 5.08 4.84 4.23  HGB 14.1 13.2 11.5*  HCT 43.0 41.2 36.2*  MCV 84.6 85.1 85.6  MCH 27.8 27.3 27.2  MCHC 32.8 32.0 31.8  RDW 15.9* 16.1* 16.6*  PLT 103* 101* 87*    BNPNo results for input(s): BNP, PROBNP in the last 168 hours.   DDimer No results for input(s): DDIMER in the last 168 hours.   Radiology    MR BRAIN W WO CONTRAST  Result Date: 01/06/2020 CLINICAL DATA:  Cardiac arrest. EXAM: MRI HEAD WITHOUT AND WITH CONTRAST TECHNIQUE: Multiplanar, multiecho pulse sequences of the brain and surrounding structures were obtained without and with intravenous contrast. CONTRAST:  33m GADAVIST GADOBUTROL 1 MMOL/ML IV SOLN COMPARISON:  07/13/2016 brain MRI FINDINGS: Brain: There is a large right PCA territory acute infarct. There are small foci of acute ischemia in the left cerebellum and left occipital lobe. Multifocal hyperintense T2-weighted signal within the white matter. White matter T2 hyperintensity. No midline shift or other mass effect. No chronic microhemorrhage. Normal midline structures. There is no abnormal contrast enhancement. Vascular: Absent right PCA flow void. Skull and upper cervical spine: Normal marrow signal. Sinuses/Orbits: Fluid filling the nasopharynx. Bilateral maxillary sinus retention cysts. Small amount of bilateral mastoid fluid. Normal orbits. Other: None IMPRESSION: 1. Large right PCA territory acute infarct. No hemorrhage or mass effect. 2. Absent right PCA flow void consistent with occlusion. 3. Small foci of acute ischemia in the left cerebellum and left occipital lobe. Electronically Signed   By: KUlyses JarredM.D.   On: 01/06/2020 00:19   DG Chest Port 1 View  Result Date: 01/05/2020 CLINICAL DATA:  Intubation.  Respiratory failure. EXAM: PORTABLE CHEST 1 VIEW COMPARISON:  01/04/2020. FINDINGS: Endotracheal tube, left IJ line, NG tube in stable  position. Stable cardiomegaly. Diffuse bilateral pulmonary infiltrates/edema again  noted. No significant interim change. No pleural effusion or pneumothorax. IMPRESSION: 1.  Lines and tubes in stable position. 2. Diffuse bilateral pulmonary infiltrates/edema again noted without interim change. 3.  Stable cardiomegaly. Electronically Signed   By: Marcello Moores  Register   On: 01/05/2020 06:18   ECHOCARDIOGRAM COMPLETE  Result Date: 01/05/2020    ECHOCARDIOGRAM REPORT   Patient Name:   YSIDRO RAMSAY Mcgonagle Date of Exam: 01/05/2020 Medical Rec #:  [id number]      Height:       71.0 in Accession #:    [id number]     Weight:       257.3 lb Date of Birth:  03-05-1956       BSA:          2.347 m Patient Age:    15 years       BP:           131/99 mmHg Patient Gender: M              HR:           102 bpm. Exam Location:  Inpatient Procedure: 2D Echo and Intracardiac Opacification Agent Indications:    Cardiac arrest I46.9  History:        Patient has prior history of Echocardiogram examinations, most                 recent 05/28/2019. Arrythmias:Atrial Fibrillation; Risk                 Factors:Hypertension. History of nonischemic cardiomyopathy.                 Ventricular fibrillation, Acute kidney injury, Anoxic                 encephalopathy.  Sonographer:    Darlina Sicilian RDCS Referring Phys: [place]  Sonographer Comments: Echo performed with patient supine and on artificial respirator. IMPRESSIONS  1. Left ventricular ejection fraction, by estimation, is 35%. The left ventricle has moderately decreased function. The left ventricle demonstrates global hypokinesis. Left ventricular diastolic parameters are indeterminate.  2. Right ventricular systolic function is mildly reduced. The right ventricular size is mildly enlarged. There is moderately elevated pulmonary artery systolic pressure. The estimated right ventricular systolic pressure is 99.8 mmHg.  3. Left atrial size was mildly dilated.  4. Right atrial size was mildly dilated.  5. The mitral valve is normal in structure. Mild mitral valve  regurgitation. No evidence of mitral stenosis.  6. The aortic valve was not well visualized. Aortic valve regurgitation is trivial. No aortic stenosis is present.  7. Aortic dilatation noted. There is mild dilatation of the ascending aorta measuring 43 mm.  8. The inferior vena cava is dilated in size with <50% respiratory variability, suggesting right atrial pressure of 15 mmHg.  9. The patient was in atrial fibrillation. FINDINGS  Left Ventricle: Left ventricular ejection fraction, by estimation, is 35%. The left ventricle has moderately decreased function. The left ventricle demonstrates global hypokinesis. Definity contrast agent was given IV to delineate the left ventricular endocardial borders. The left ventricular internal cavity size was normal in size. There is no left ventricular hypertrophy. Left ventricular diastolic parameters are indeterminate. Right Ventricle: The right ventricular size is mildly enlarged. No increase in right ventricular wall thickness. Right ventricular systolic function is mildly reduced. There is moderately elevated pulmonary artery systolic pressure. The tricuspid regurgitant velocity is 2.74 m/s, and with  an assumed right atrial pressure of 15 mmHg, the estimated right ventricular systolic pressure is 48.5 mmHg. Left Atrium: Left atrial size was mildly dilated. Right Atrium: Right atrial size was mildly dilated. Pericardium: There is no evidence of pericardial effusion. Mitral Valve: The mitral valve is normal in structure. Mild mitral annular calcification. Mild mitral valve regurgitation. No evidence of mitral valve stenosis. Tricuspid Valve: The tricuspid valve is normal in structure. Tricuspid valve regurgitation is trivial. Aortic Valve: The aortic valve was not well visualized. Aortic valve regurgitation is trivial. No aortic stenosis is present. Pulmonic Valve: The pulmonic valve was not well visualized. Pulmonic valve regurgitation is trivial. Aorta: Aortic dilatation  noted. There is mild dilatation of the ascending aorta measuring 43 mm. Venous: The inferior vena cava is dilated in size with less than 50% respiratory variability, suggesting right atrial pressure of 15 mmHg. IAS/Shunts: No atrial level shunt detected by color flow Doppler.  LEFT VENTRICLE PLAX 2D LVIDd:         5.30 cm LVIDs:         4.40 cm LV PW:         0.90 cm LV IVS:        0.90 cm LVOT diam:     2.20 cm LV SV:         44 LV SV Index:   19 LVOT Area:     3.80 cm  LV Volumes (MOD) LV vol d, MOD A2C: 112.0 ml LV vol d, MOD A4C: 114.0 ml LV vol s, MOD A2C: 58.0 ml LV vol s, MOD A4C: 76.5 ml LV SV MOD A2C:     54.0 ml LV SV MOD A4C:     114.0 ml LV SV MOD BP:      46.6 ml RIGHT VENTRICLE TAPSE (M-mode): 1.0 cm LEFT ATRIUM             Index       RIGHT ATRIUM           Index LA diam:        5.00 cm 2.13 cm/m  RA Area:     19.20 cm LA Vol (A2C):   44.3 ml 18.88 ml/m RA Volume:   48.30 ml  20.58 ml/m LA Vol (A4C):   80.6 ml 34.35 ml/m LA Biplane Vol: 60.9 ml 25.95 ml/m  AORTIC VALVE LVOT Vmax:   73.20 cm/s LVOT Vmean:  51.600 cm/s LVOT VTI:    0.116 m  AORTA Ao Root diam: 3.90 cm Ao Asc diam:  3.90 cm MITRAL VALVE              TRICUSPID VALVE MV Area (PHT): 5.81 cm   TR Peak grad:   30.0 mmHg MV Decel Time: 131 msec   TR Vmax:        274.00 cm/s MR Peak grad: 71.2 mmHg MR Mean grad: 52.0 mmHg   SHUNTS MR Vmax:      422.00 cm/s Systemic VTI:  0.12 m MR Vmean:     341.0 cm/s  Systemic Diam: 2.20 cm MV E velocity: 0.94 cm/s Loralie Champagne MD Electronically signed by Loralie Champagne MD Signature Date/Time: 01/05/2020/5:00:41 PM    Final     Cardiac Studies   Echo 01/05/20:  1. Left ventricular ejection fraction, by estimation, is 35%. The left  ventricle has moderately decreased function. The left ventricle  demonstrates global hypokinesis. Left ventricular diastolic parameters are  indeterminate.  2. Right ventricular systolic function is mildly reduced. The right  ventricular size is mildly enlarged.  There is moderately elevated  pulmonary artery systolic pressure. The estimated right ventricular  systolic pressure is 41.7 mmHg.  3. Left atrial size was mildly dilated.  4. Right atrial size was mildly dilated.  5. The mitral valve is normal in structure. Mild mitral valve  regurgitation. No evidence of mitral stenosis.  6. The aortic valve was not well visualized. Aortic valve regurgitation  is trivial. No aortic stenosis is present.  7. Aortic dilatation noted. There is mild dilatation of the ascending  aorta measuring 43 mm.  8. The inferior vena cava is dilated in size with <50% respiratory  variability, suggesting right atrial pressure of 15 mmHg.  9. The patient was in atrial fibrillation.   Patient Profile     64 y.o. male with out of hospital V. fib cardiac arrest requiring 1 hour of ACLS before ROSC.  Assessment & Plan    1.  Non-STEMI: Troponin greater than 27,000. LVEF 35% on echo yesterday. History of nonischemic cardiomyopathy that normalized on last year's echo study.  Will continue ASA and IV heparin.   2.  Acute respiratory failure with hypoxemia: Vent dependent on full support. Management per CCM team.  3.  Ventricular fibrillation: Considerations include LV dysfunction related to nonischemic cardiomyopathy versus ACS/non-STEMI.  Considering current situation, treatment is conservative at this time.   4.  Acute kidney injury, nonoliguric: Creatinine trend reviewed.   5.  Anoxic encephalopathy: Large right PCA territory acute infarct. Prognosis guarded  6.  Hypertension, uncontrolled: BP controlled. No changes.   7.  Atrial fibrillation: Rate controlled. Will continue amiodarone drip and metoprolol. He is on IV heparin.       For questions or updates, please contact [place] Please consult [email or web address] for contact info under        Signed, Lauree Chandler, MD  01/06/2020, 7:42 AM

## 2020-01-06 NOTE — Progress Notes (Signed)
eLink Physician-Brief Progress Note Patient Name: Colin [name] DOB: May 22, 1955 MRN: [id number]   Date of Service  01/06/2020  HPI/Events of Note  K+ 3.6  eICU Interventions  KCL 10 meq iv Q 1 hour x 2        Dorrie Cocuzza U Lyda Colcord 01/06/2020, 7:06 AM

## 2020-01-06 DEATH — deceased

## 2020-01-07 ENCOUNTER — Inpatient Hospital Stay (HOSPITAL_COMMUNITY): Payer: Commercial Managed Care - PPO

## 2020-01-07 DIAGNOSIS — Z9911 Dependence on respirator [ventilator] status: Secondary | ICD-10-CM

## 2020-01-07 DIAGNOSIS — R739 Hyperglycemia, unspecified: Secondary | ICD-10-CM

## 2020-01-07 DIAGNOSIS — N179 Acute kidney failure, unspecified: Secondary | ICD-10-CM

## 2020-01-07 DIAGNOSIS — J80 Acute respiratory distress syndrome: Secondary | ICD-10-CM

## 2020-01-07 DIAGNOSIS — I6340 Cerebral infarction due to embolism of unspecified cerebral artery: Secondary | ICD-10-CM | POA: Insufficient documentation

## 2020-01-07 LAB — MAGNESIUM: Magnesium: 2.7 mg/dL — ABNORMAL HIGH (ref 1.7–2.4)

## 2020-01-07 LAB — POCT I-STAT 7, (LYTES, BLD GAS, ICA,H+H)
Acid-base deficit: 6 mmol/L — ABNORMAL HIGH (ref 0.0–2.0)
Acid-base deficit: 3 mmol/L — ABNORMAL HIGH (ref 0.0–2.0)
Bicarbonate: 21.1 mmol/L (ref 20.0–28.0)
Bicarbonate: 24.4 mmol/L (ref 20.0–28.0)
Calcium, Ion: 0.94 mmol/L — ABNORMAL LOW (ref 1.15–1.40)
Calcium, Ion: 0.92 mmol/L — ABNORMAL LOW (ref 1.15–1.40)
HCT: 33 % — ABNORMAL LOW (ref 39.0–52.0)
HCT: 32 % — ABNORMAL LOW (ref 39.0–52.0)
Hemoglobin: 11.2 g/dL — ABNORMAL LOW (ref 13.0–17.0)
Hemoglobin: 10.9 g/dL — ABNORMAL LOW (ref 13.0–17.0)
O2 Saturation: 93 %
O2 Saturation: 99 %
Patient temperature: 38.1
Patient temperature: 38.1
Potassium: 5.4 mmol/L — ABNORMAL HIGH (ref 3.5–5.1)
Potassium: 5.1 mmol/L (ref 3.5–5.1)
Sodium: 136 mmol/L (ref 135–145)
Sodium: 137 mmol/L (ref 135–145)
TCO2: 23 mmol/L (ref 22–32)
TCO2: 26 mmol/L (ref 22–32)
pCO2 arterial: 49.4 mmHg — ABNORMAL HIGH (ref 32.0–48.0)
pCO2 arterial: 55.8 mmHg — ABNORMAL HIGH (ref 32.0–48.0)
pH, Arterial: 7.245 — ABNORMAL LOW (ref 7.350–7.450)
pH, Arterial: 7.253 — ABNORMAL LOW (ref 7.350–7.450)
pO2, Arterial: 84 mmHg (ref 83.0–108.0)
pO2, Arterial: 190 mmHg — ABNORMAL HIGH (ref 83.0–108.0)

## 2020-01-07 LAB — BASIC METABOLIC PANEL
Anion gap: 16 — ABNORMAL HIGH (ref 5–15)
BUN: 95 mg/dL — ABNORMAL HIGH (ref 8–23)
CO2: 21 mmol/L — ABNORMAL LOW (ref 22–32)
Calcium: 8.3 mg/dL — ABNORMAL LOW (ref 8.9–10.3)
Chloride: 101 mmol/L (ref 98–111)
Creatinine, Ser: 4.92 mg/dL — ABNORMAL HIGH (ref 0.61–1.24)
GFR calc Af Amer: 13 mL/min — ABNORMAL LOW (ref >60)
GFR calc non Af Amer: 12 mL/min — ABNORMAL LOW (ref >60)
Glucose, Bld: 196 mg/dL — ABNORMAL HIGH (ref 70–99)
Potassium: 4.5 mmol/L (ref 3.5–5.1)
Sodium: 138 mmol/L (ref 135–145)

## 2020-01-07 LAB — CBC
HCT: 35.3 % — ABNORMAL LOW (ref 39.0–52.0)
Hemoglobin: 11 g/dL — ABNORMAL LOW (ref 13.0–17.0)
MCH: 27 pg (ref 26.0–34.0)
MCHC: 31.2 g/dL (ref 30.0–36.0)
MCV: 86.7 fL (ref 80.0–100.0)
Platelets: 92 10*3/uL — ABNORMAL LOW (ref 150–400)
RBC: 4.07 MIL/uL — ABNORMAL LOW (ref 4.22–5.81)
RDW: 17.5 % — ABNORMAL HIGH (ref 11.5–15.5)
WBC: 5.4 10*3/uL (ref 4.0–10.5)
nRBC: 0.4 % — ABNORMAL HIGH (ref 0.0–0.2)

## 2020-01-07 LAB — GLUCOSE, CAPILLARY
Glucose-Capillary: 174 mg/dL — ABNORMAL HIGH (ref 70–99)
Glucose-Capillary: 172 mg/dL — ABNORMAL HIGH (ref 70–99)
Glucose-Capillary: 193 mg/dL — ABNORMAL HIGH (ref 70–99)
Glucose-Capillary: 217 mg/dL — ABNORMAL HIGH (ref 70–99)
Glucose-Capillary: 210 mg/dL — ABNORMAL HIGH (ref 70–99)

## 2020-01-07 LAB — HEPARIN LEVEL (UNFRACTIONATED): Heparin Unfractionated: 0.14 IU/mL — ABNORMAL LOW (ref 0.30–0.70)

## 2020-01-07 LAB — TRIGLYCERIDES: Triglycerides: 408 mg/dL — ABNORMAL HIGH (ref <150)

## 2020-01-07 LAB — PHOSPHORUS: Phosphorus: 5 mg/dL — ABNORMAL HIGH (ref 2.5–4.6)

## 2020-01-07 MED ORDER — VANCOMYCIN HCL 10 G IV SOLR
2500.0000 mg | Freq: Once | INTRAVENOUS | Status: AC
  Administered 2020-01-07: 2500 mg via INTRAVENOUS
  Filled 2020-01-07: qty 2500

## 2020-01-07 MED ORDER — VANCOMYCIN VARIABLE DOSE PER UNSTABLE RENAL FUNCTION (PHARMACIST DOSING): Status: DC

## 2020-01-07 MED ORDER — EPINEPHRINE 1 MG/10ML IJ SOSY
PREFILLED_SYRINGE | INTRAMUSCULAR | Status: AC
  Filled 2020-01-07: qty 20

## 2020-01-07 MED ORDER — POLYETHYLENE GLYCOL 3350 17 G PO PACK
17.0000 g | PACK | Freq: Two times a day (BID) | ORAL | Status: DC
  Administered 2020-01-07: 17 g
  Filled 2020-01-07 (×2): qty 1

## 2020-01-07 MED ORDER — SODIUM CHLORIDE 0.9 % IV SOLN
2.0000 g | INTRAVENOUS | Status: DC
  Administered 2020-01-07 – 2020-01-08 (×2): 2 g via INTRAVENOUS
  Filled 2020-01-07 (×3): qty 2

## 2020-01-07 MED ORDER — SENNOSIDES 8.8 MG/5ML PO SYRP
5.0000 mL | ORAL_SOLUTION | Freq: Two times a day (BID) | ORAL | Status: DC
  Filled 2020-01-07: qty 5

## 2020-01-07 MED ORDER — SODIUM BICARBONATE 8.4 % IV SOLN
100.0000 meq | Freq: Once | INTRAVENOUS | Status: AC
  Administered 2020-01-07: 100 meq via INTRAVENOUS

## 2020-01-07 MED ORDER — HEPARIN SODIUM (PORCINE) 5000 UNIT/ML IJ SOLN
5000.0000 [IU] | Freq: Three times a day (TID) | INTRAMUSCULAR | Status: DC
  Administered 2020-01-07 – 2020-01-09 (×6): 5000 [IU] via SUBCUTANEOUS
  Filled 2020-01-07 (×6): qty 1

## 2020-01-07 MED ORDER — PROPRANOLOL HCL 40 MG PO TABS
40.0000 mg | ORAL_TABLET | Freq: Four times a day (QID) | ORAL | Status: DC
  Administered 2020-01-07: 40 mg
  Filled 2020-01-07 (×5): qty 1

## 2020-01-07 MED ORDER — STROKE: EARLY STAGES OF RECOVERY BOOK
Freq: Once | Status: AC
  Filled 2020-01-07: qty 1

## 2020-01-07 MED ORDER — PHENYLEPHRINE HCL-NACL 20-0.9 MG/250ML-% IV SOLN
0.0000 ug/min | INTRAVENOUS | Status: DC
  Administered 2020-01-07: 400 ug/min via INTRAVENOUS
  Filled 2020-01-07: qty 500

## 2020-01-07 MED ORDER — PHENYLEPHRINE HCL-NACL 10-0.9 MG/250ML-% IV SOLN
0.0000 ug/min | INTRAVENOUS | Status: DC
  Administered 2020-01-07: 250 ug/min via INTRAVENOUS
  Administered 2020-01-07: 60 ug/min via INTRAVENOUS
  Filled 2020-01-07: qty 500
  Filled 2020-01-07 (×2): qty 250

## 2020-01-07 MED ORDER — STERILE WATER FOR INJECTION IV SOLN
INTRAVENOUS | Status: DC
  Filled 2020-01-07 (×4): qty 850

## 2020-01-07 NOTE — Progress Notes (Signed)
NAME:  Colin [name], MRN:  [id number], DOB:  November 18, 1955, LOS: 5 ADMISSION DATE:  12/31/2019, CONSULTATION DATE:  01/07/20 REFERRING MD:  EDP, CHIEF COMPLAINT:  Cardiac arrest   Brief History   64 year old [name] past medical history of A. fib/a flutter on Eliquis, cardiomyopathy, hypertension, obesity, OSA who had a witnessed out of hospital V. fib arrest with 1 hour of CPR before ROSC obtained.  Intubated and PCCM consulted for admission  History of present illness   Colin [name] is a 64 year old [name] with past medical history of A. fib/a flutter s/p ablation on Eliquis, dilated aortic root cardiomyopathy, hypertension, obesity, OSA who has been in his usual state of health recently, though his wife noted he seemed to be breathing heavier and was more tired than usual for about the last day.  She says he never complains, just went to bed early.  This evening she was talking to him when he collapsed in front of her.  She called 911 and started chest compressions.  EMS found patient in V-fib, he was intubated and defibrillated multiple times, given amiodarone and a total of 7 mg of epi was obtained, estimated 1 hour of ACLS.  On arrival to the ED, patient was unresponsive, head CT without acute findings.  EKG with new anterior ST depressions and troponin 1,600.  Cardiology was consulted and confirmed no STEMI. blood work significant for lactic acidosis and leukocytosis with AKI and elevated LFTs.  Covid-19 negative no other source of infection noted.  PCCM consulted for admission  Past Medical History   has a past medical history of Atrial flutter (HCC), DCM (dilated cardiomyopathy) (HCC), Dilated aortic root (HCC) (04/15/2015), Hypertension, Obesities, morbid (HCC), OSA (obstructive sleep apnea), and Persistent atrial fibrillation (HCC).   Significant Hospital Events   8/28 Admit to PCCM  Consults:  Cardiology, neuro.  Procedures:  8/27 ETT  Significant Diagnostic Tests:  8/27 CXR>>Central  vascular congestion consistent with the recent CPR. 8/28 CT head>>Mild age-related atrophy and chronic microvascular ischemic changes. Old right occipital infarct. 8/29 EEG > profound diffuse encephalopathy without seizure or epileptiform discharges. 8/31 Echo > EF 35%, mildly reduced RV function. 8/31 Brain MRI > large rigtht PCA territory infarct.  Absent right PCA flow c/w occlusion, small acute ischemia in left cerebellum and left occipital lobes.  Micro Data:  8/28 Sars-Cov-2>>negative  Antimicrobials:  Unasyn 8/28 >>   Interim history/subjective:  Remains on sedation for better compliance 2 episodes of desaturation this a.m. improved with suctioning  Objective   Blood pressure (!) 94/57, pulse 94, temperature 100 F (37.8 C), resp. rate (!) 32, height 5\' 11"  (1.803 m), weight 122.9 kg, SpO2 94 %.    Vent Mode: PRVC FiO2 (%):  [60 %-100 %] 80 % Set Rate:  [28 bmp] 28 bmp Vt Set:  [530 mL] 530 mL PEEP:  [12 cmH20] 12 cmH20 Plateau Pressure:  [29 cmH20-33 cmH20] 29 cmH20   Intake/Output Summary (Last 24 hours) at 01/07/2020 0816 Last data filed at 01/07/2020 0630 Gross per 24 hour  Intake 2401.02 ml  Output 590 ml  Net 1811.02 ml   Filed Weights   01/05/20 0500 01/06/20 0100 01/07/20 0600  Weight: 116.7 kg 117.9 kg 122.9 kg   Physical Exam General: Colin [name] lying in bed in no acute distress HEENT: ETT, MM pink/moist, PERRL, pupils pinpoint Neuro: Sedated on ventilator CV: s1s2 regular rate and rhythm, no murmur, rubs, or gallops,  PULM: Coarse breath sounds bilaterally, no added breath sounds, thick secretions  suctioned from ET tube mild vent dyssynchrony, oxygen saturations low to mid 90s on 80% FiO2 GI: soft, bowel sounds active in all 4 quadrants, non-tender, non-distended,  Extremities: warm/dry, no edema  Skin: no rashes or lesions   Assessment & Plan:   Acute respiratory failure with hypoxemia, ventilator dependence ARDS physiology, suspect aspiration  pneumonia -In the setting of witnessed V. fib arrest and suspected aspiration pneumonia -Patient had 2 episodes of desaturation a.m. of 9/2 resolved with suctioning P: Continue ventilator support with lung protective strategies  Wean PEEP and FiO2 for sats greater than 90%. Head of bed elevated 30 degrees. Plateau pressures less than 30 cm H20.  Follow intermittent chest x-ray and ABG.   SAT/SBT as tolerated, mentation preclude extubation  Ensure adequate pulmonary hygiene  Follow cultures  VAP bundle in place  PAD protocol Continue empiric Unasyn for 7 days, tentative stop date 9/3 May need bronch if continues to desaturate to assess for plugging  Witnessed, out of hospital V. fib cardiac arrest  - 1 hour ACLS before ROSC, CT head negative.  Underlying cause unclear, possible non-STEMI.  Doubt PE since he is on chronic anticoagulation. MRI did not suggest anoxic encephalopathy as suspected but did reveal acute infarcts (see below). P: Avoid fevers with goal of normothermia Supportive care Continuous telemetry  NSTEMI  -Troponin peaked at greater than 27,000 History hypertension Chronic atrial fibrillation -Patient is chronically anticoagulated with p.o. Eliquis at baseline and is on chronic beta-blocker History of dilated cardiomyopathy   -Echocardiogram last year revealed normalization of ejection fraction but new echo 8/31 with EF back down to 35 % P: Continue aspirin  Cardiology following, appreciate assistance Strict intake and output Daily weight Continuous telemetry Continue amiodarone and metoprolol IV heparin drip on hold due to acute stroke Blood pressure remains well controlled  Acute kidney injury ATN due to arrest, nonoliguric. -Creatinine 0.92 11/20/2019, creatinine on admission 1.94.   Unfortunately creatinine doubled to 4.92 morning of 9/2 P: Monitor urine output Avoid nephrotoxins Trend Bmet Patient will likely need nephrology consult  Elevated LFTs,  likely shock liver  - gradually improving P: Intermittently follow LFTs Avoid hepatotoxins as able Cautiously continue amiodarone, statins  Hyperglycemia P: Continue SSI Continue long-acting insulin  Hypothyroidism. P: Continue home Synthroid  Right PCA infarct with absent right PCA flow, left cerebellar and occipital lobe infarcts. P: Neurology following, appreciate assistance Heparin drip remains on hold per stroke team Maintain neuro protective measures; goal for eurothermia, euglycemia, eunatermia, normoxia, and PCO2 goal of 35-40 Nutrition and bowel regiment  Seizure precautions   Fever  - consider neuro storm given his intermittent tachycardia/tachypnea/fever/etc, but not convinced at this point P: Continue to new cooling blanket/Tylenol as needed Appreciate urology's assistance in managing  Best practice:  Diet: TF's Pain/Anxiety/Delirium protocol (if indicated): fentanyl gtt / propofol gtt/ VAP protocol (if indicated): HOB 30 degrees, suction as needed DVT prophylaxis: Heparin GI prophylaxis: Protonix Glucose control: SSI Mobility: Bedrest Code Status: Full code Family Communication: Will continue to update wife at bedside once she arrives.  Family hoping for a miracle. Disposition: ICU   Critical care time:    Performed by: Delfin Gant  Total critical care time: 38 minutes  Critical care time was exclusive of separately billable procedures and treating other patients.  Critical care was necessary to treat or prevent imminent or life-threatening deterioration.  Critical care was time spent personally by me on the following activities: development of treatment plan with patient and/or surrogate as well  as nursing, discussions with consultants, evaluation of patient's response to treatment, examination of patient, obtaining history from patient or surrogate, ordering and performing treatments and interventions, ordering and review of laboratory studies,  ordering and review of radiographic studies, pulse oximetry and re-evaluation of patient's condition.  Delfin Gant, NP-C [facility] Contact / Pager information can be found on Amion  01/07/2020, 8:42 AM

## 2020-01-07 NOTE — Progress Notes (Signed)
eLink Physician-Brief Progress Note Patient Name: Colin [name] DOB: 11/25/1955 MRN: [id number]   Date of Service  01/07/2020  HPI/Events of Note  Asked to review CXR: 1. Persistent extensive bilateral airspace infiltrates in keeping with multifocal pneumonia, ARDS, or, less likely, asymmetric alveolar pulmonary edema. 2. Stable support apparatus. Left internal jugular central venous catheter tip unchanged, oriented horizontally within the proximal SVC. 3. No pneumothorax or pleural effusion. Asked to review abdominal film: 1. Orogastric tube tip overlies the expected distal body of the stomach.   eICU Interventions  OK to use OGT      Intervention Category Major Interventions: Other:  Lenell Antu 01/07/2020, 11:57 PM

## 2020-01-07 NOTE — Progress Notes (Addendum)
STROKE TEAM PROGRESS NOTE   INTERVAL HISTORY No family at bedside. Patient is intubated and sedated.  I have personally reviewed history of presenting illness, electronic medical records and imaging films in PACS.  He presented with out of hospital cardiac arrest with prolonged ACLS for 1 hour with initial rhythm of V. fib.  Subsequently he has remained sedated on Versed, fentanyl and propofol and has developed ventilator dyssynchrony requiring sedation.  Neurological exam is remained quite poor and MRI scan has shown right posterior cerebral artery infarct.  No family at the bedside. Vitals:   01/07/20 0900 01/07/20 1000 01/07/20 1100 01/07/20 1131  BP:      Pulse: (!) 58 63 (!) 127   Resp: 20 19 (!) 33   Temp: 99.7 F (37.6 C) 99.9 F (37.7 C) 99.7 F (37.6 C)   TempSrc:      SpO2: 91% 92% 96% (!) 86%  Weight:      Height:       CBC:  Recent Labs  Lab 01-11-20 2333 01/02/20 0031 01/06/20 0315 01/06/20 0315 01/06/20 1150 01/07/20 0453  WBC 20.9*   < > 6.5  --   --  5.4  NEUTROABS 11.0*  --   --   --   --   --   HGB 16.6   < > 11.5*   < > 11.6* 11.0*  HCT 52.9*   < > 36.2*   < > 34.0* 35.3*  MCV 88.5   < > 85.6  --   --  86.7  PLT 220   < > 87*  --   --  92*   < > = values in this interval not displayed.   Basic Metabolic Panel:  Recent Labs  Lab 01/06/20 0315 01/06/20 0315 01/06/20 1150 01/07/20 0453  NA 137   < > 140 138  K 3.6   < > 4.1 4.5  CL 102  --   --  101  CO2 23  --   --  21*  GLUCOSE 232*  --   --  196*  BUN 68*  --   --  95*  CREATININE 2.57*  --   --  4.92*  CALCIUM 7.9*  --   --  8.3*  MG 2.6*  --   --  2.7*  PHOS 3.2  --   --  5.0*   < > = values in this interval not displayed.   Lipid Panel:  Recent Labs  Lab 01/07/20 0453  TRIG 408*   HgbA1c:  Recent Labs  Lab 01/02/20 0440  HGBA1C 7.5*   Urine Drug Screen:  Recent Labs  Lab 01/02/20 0247  LABOPIA NONE DETECTED  COCAINSCRNUR NONE DETECTED  LABBENZ POSITIVE*  AMPHETMU NONE  DETECTED  THCU NONE DETECTED  LABBARB NONE DETECTED    Alcohol Level No results for input(s): ETH in the last 168 hours.  IMAGING past 24 hours No results found.  PHYSICAL EXAM : Patient is intubated and sedated with propofol and fentanyl drips. . Afebrile. Head is nontraumatic. Neck is supple without bruit.    Cardiac exam no murmur or gallop. Lungs are clear to auscultation. Distal pulses are well felt. Neurological Exam : Patient is intubated and sedated on propofol and fentanyl drips.  Eyes are closed.  He is unresponsive.  Pupils are 2 mm unreactive bilaterally.  Corneal reflexes are sluggish bilaterally.  Doll's eye movements are absent.  There is absent cough and gag.  He does have some ventilatory effort.  No response to sternal rub or nailbed pressure.  Plantars are mute. ASSESSMENT/PLAN Colin [name] is a 64 y.o. male with past for history of atrial fibrillation/atrial flutter status post ablation on Eliquis, dilated aortic root cardiomyopathy, hypertension, obstructive sleep apnea who has been at his usual state of health until his wife noted him breathing heavier and he was more tired than usual.  On the evening of 11-Jan-2020 patient collapsed in front of her.  She called 911 started chest compressions.  EMS found patient to be in V. fib.  It was estimated that 1 hour of ACLS was obtained prior to ROSC.  Patient's target was normothermia at that time.  During work-up to evaluate for hypoxic injury it was noted that patient had a right PCA infarct.  Thus neurology was consulted.   Stroke:  right PCA infarct embolic secondary to V. fib cardiac arrest.  He also has atrial fibrillation which could have also contributed to his embolic stroke Code Stroke 1. No acute intracranial pathology. 2. Mild age-related atrophy and chronic microvascular ischemic changes. Old right occipital infarct.   CTA head and neck not obtained d/t poor kidney function; and patient condition most consistent  with hypoxic brain injury.   MRI  1. Large right PCA territory acute infarct. No hemorrhage or mass effect. Absent right PCA flow void consistent with occlusion. Small foci of acute ischemia in the left cerebellum and left occipital lobe.  2D Echo 1. Left ventricular ejection fraction, by estimation, is 35%. The left  ventricle has moderately decreased function. The left ventricle  demonstrates global hypokinesis. Left ventricular diastolic parameters are  indeterminate.  2. Right ventricular systolic function is mildly reduced. The right  ventricular size is mildly enlarged. There is moderately elevated  pulmonary artery systolic pressure. The estimated right ventricular  systolic pressure is 45.0 mmHg.  3. Left atrial size was mildly dilated.  4. Right atrial size was mildly dilated.  5. The mitral valve is normal in structure. Mild mitral valve  regurgitation. No evidence of mitral stenosis.  6. The aortic valve was not well visualized. Aortic valve regurgitation  is trivial. No aortic stenosis is present.  7. Aortic dilatation noted. There is mild dilatation of the ascending  aorta measuring 43 mm.  8. The inferior vena cava is dilated in size with <50% respiratory  variability, suggesting right atrial pressure of 15 mmHg.  9. The patient was in atrial fibrillation.   LDL 77  HgbA1c 7.5  VTE prophylaxis - Heparin and SCD's    Diet   Diet NPO time specified     Eliquis (apixaban) daily prior to admission, now on heparin IV held on 01/06/20 per primary team.   Therapy recommendations:  Pending  Disposition:  TBD  Hypertension  Home meds:  Metoprolol 50 mg BID  Unstable . Permissive hypertension (OK if < 220/120) but gradually normalize in 5-7 days . Long-term BP goal normotensive  Hyperlipidemia  Home meds:  none   LDL 77, goal < 70  Add atorvastatin 80 mg  Continue statin at discharge  Diabetes type II Uncontrolled  Home meds:  Ozempic, farxiga,  toujeo  HgbA1c 7.5, goal < 7.0  CBGs Recent Labs    01/07/20 0328 01/07/20 0814 01/07/20 1135  GLUCAP 174* 172* 193*      SSI  Other Stroke Risk Factors  Obesity, Body mass index is 37.79 kg/m., recommend weight loss, diet and exercise as appropriate   Family hx stroke (mother)  Obstructive sleep  apnea,   Other Active Problems  Hypoxic brain injury  Hospital day # 5  Colin Lucks, MSN, NP-C [facility] [phone] I have personally obtained history,examined this patient, reviewed notes, independently viewed imaging studies, participated in medical decision making and plan of care.ROS completed by me personally and pertinent positives fully documented  I have made any additions or clarifications directly to the above note. Agree with note above.  Patient's neurological exam is quite poor though limited due to sedation.  EEG shows diffuse slowing of 5 to 6 Hz.  He definitely has underlying hypoxic ischemic brain injury given 1 hour of cardiac arrest.  Prognosis for survival and making meaningful improvement in having functional life seem quite slim.  Recommend wean off sedation as tolerated and reassess in 24 to 48 hours.  Discussed with Dr. Levon Hedger critical care MD and answered questions.  No family available at the bedside for discussion.This patient is critically ill and at significant risk of neurological worsening, death and care requires constant monitoring of vital signs, hemodynamics,respiratory and cardiac monitoring, extensive review of multiple databases, frequent neurological assessment, discussion with family, other specialists and medical decision making of high complexity.I have made any additions or clarifications directly to the above note.This critical care time does not reflect procedure time, or teaching time or supervisory time of PA/NP/Med Resident etc but could involve care discussion time.  I spent 30 minutes of neurocritical care time   in the care of  this patient.     Delia Heady, MD Medical Director [facility] Pager: [phone] 01/07/2020 4:39 PM   To contact Stroke Continuity provider, please refer to [email or web address]. After hours, contact General Neurology

## 2020-01-07 NOTE — Plan of Care (Addendum)
Wife updated at bedside and daughter updated over the phone.  They understand our concerns over his prognosis regarding potential for neurologic recovery.  We will continue to decrease his sedation as much as tolerated.  His high vent requirements may limit [place] if he develops desaturations with ventilator dyssynchrony as sedation is decreased. Con't all current care measures.  Steffanie Dunn, DO 01/07/20 2:11 PM [facility]    Off propofol this afternoon and midazolam since this morning. Down to fentanyl 59mcg/h.   Neuro exam unchanged. Pupils remain unreactive. No tracheal gag. No withdraw from painful stimulation or verbal stimulation. Continues to breathe above the vent, tachypneic, saturating 96%. Wife remains at bedside and updated.  Con't to minimize sedation as much as possible- would only add sedation back if refractory hypoxia from vent dyssynchrony to facilitate neuroprognostication. Avoid midazolam. Can restart propofol if hypoxic.  Steffanie Dunn, DO 01/07/20 3:50 PM [facility]

## 2020-01-07 NOTE — Progress Notes (Signed)
eLink Physician-Brief Progress Note Patient Name: Colin [name] DOB: Mar 10, 1956 MRN: [id number]   Date of Service  01/07/2020  HPI/Events of Note  ABG on 100%/PVC 35/TV 530/P 16 = 7.253/55.8/190. Ppeak = 35-39 and Pplat = 39? No room to increase Ve by increasing TV or PRVC rate.   eICU Interventions  Plan: 1. NaHCO3 IV infusion to run IV at 50 mL/hour. 2. Repeat ABG at 5 AM.      Intervention Category Major Interventions: Respiratory failure - evaluation and management;Acid-Base disturbance - evaluation and management  Bhumi Godbey Eugene 01/07/2020, 9:26 PM

## 2020-01-07 NOTE — Progress Notes (Signed)
Cardiology note:  Pt sedated on vent. Rate controlled atrial fib on amiodarone drip. No new recommendations from cardiology team We will follow along and plan further cardiac workup as his neuro status improves.   Verne Carrow 01/07/2020 7:19 AM

## 2020-01-07 NOTE — Progress Notes (Signed)
Patient suddenly hypotensive in high SBP 70s to 80s/50s MAPs < 60s. HR dropping to 40s-50s. Patient saturating in low 90s and tachpneic in 40s. Called E-link. Orders for ABGs, Bicarb, and starting Neo. Awaiting for orders and will continue to monitor.  Joycie Peek, RN 01/07/2020

## 2020-01-07 NOTE — Progress Notes (Addendum)
eLink Physician-Brief Progress Note Patient Name: Colin [name] DOB: 12/20/1955 MRN: [id number]   Date of Service  01/07/2020  HPI/Events of Note  Hypotension - BP = 87/51 with MAP = 63 and HR = 45. Patient is currently on a Propofol IV infusion. ABG on 100%/PRVC 28/TV 530/P 16 = 7.24/19.4/84/23.  eICU Interventions  Plan:  1. D/C Propofol IV infusion. 2. Titrate Fentanyl Iv infusion for sedation. 3. Monitor CVP now and Q 4 hours. CVP = 26.  4. Increase PRVC rate to 35.  5. NaHCO3 100 meq IV now.  6. Repeat ABG at 8:35 PM. 7. Phenylephrine IV infusion. Titrate to MAP >= 65.      Intervention Category Major Interventions: Hypotension - evaluation and management;Acid-Base disturbance - evaluation and management;Respiratory failure - evaluation and management  Lenell Antu 01/07/2020, 7:33 PM

## 2020-01-07 NOTE — Progress Notes (Signed)
Pharmacy Antibiotic Note  Colin [name] is a 64 y.o. male admitted on January 04, 2020 with pneumonia.  Pharmacy has been consulted for Cefepime and Vancomycin dosing due to ventilator requirements and fever  Plan: Cefepime 2g IV q24 hr Vancomycin 2500mg  IV once  Monitor renal function Random vancomycin level as indicated Follow any cultures Trend WBC and fever curves  Height: 5\' 11"  (180.3 cm) Weight: 122.9 kg (270 lb 15.1 oz) IBW/kg (Calculated) : 75.3  Temp (24hrs), Avg:100.9 F (38.3 C), Min:99.7 F (37.6 C), Max:102.9 F (39.4 C)  Recent Labs  Lab 01/04/20 2333 01/02/20 0247 01/02/20 0440 01/02/20 0923 01/02/20 2357 01/03/20 0347 01/03/20 0347 01/03/20 2138 01/04/20 0402 01/05/20 0009 01/05/20 0236 01/06/20 0315 01/07/20 0453  WBC 20.9*  --    < >  --   --  6.0  --   --  8.1  --  8.5 6.5 5.4  CREATININE 1.94*  --    < >  --    < > 3.06*   < > 2.43* 2.32*  --  2.07* 2.57* 4.92*  LATICACIDVEN 7.2* 6.8*  --  3.9*  --   --   --   --  3.7* 2.1*  --   --   --    < > = values in this interval not displayed.    Estimated Creatinine Clearance: 20.5 mL/min (A) (by C-G formula based on SCr of 4.92 mg/dL (H)).    No Known Allergies  Antimicrobials this admission: Unasyn 8/28 >> 9/2 Cefepime 9/2 >> Vanc 9/2 >>  Microbiology results:  8/28 COVID - neg 8/28 MRSA PCR: neg  Thank you for allowing pharmacy to be a part of this patient's care.  9/28, PharmD PGY1 Pharmacy Resident 01/07/2020 2:33 PM

## 2020-01-08 ENCOUNTER — Inpatient Hospital Stay (HOSPITAL_COMMUNITY): Payer: Commercial Managed Care - PPO

## 2020-01-08 ENCOUNTER — Other Ambulatory Visit (HOSPITAL_COMMUNITY): Payer: Commercial Managed Care - PPO

## 2020-01-08 DIAGNOSIS — J9602 Acute respiratory failure with hypercapnia: Secondary | ICD-10-CM

## 2020-01-08 DIAGNOSIS — I4819 Other persistent atrial fibrillation: Secondary | ICD-10-CM

## 2020-01-08 DIAGNOSIS — Z7189 Other specified counseling: Secondary | ICD-10-CM

## 2020-01-08 DIAGNOSIS — R579 Shock, unspecified: Secondary | ICD-10-CM

## 2020-01-08 DIAGNOSIS — G931 Anoxic brain damage, not elsewhere classified: Secondary | ICD-10-CM

## 2020-01-08 LAB — GLUCOSE, CAPILLARY
Glucose-Capillary: 141 mg/dL — ABNORMAL HIGH (ref 70–99)
Glucose-Capillary: 144 mg/dL — ABNORMAL HIGH (ref 70–99)
Glucose-Capillary: 152 mg/dL — ABNORMAL HIGH (ref 70–99)
Glucose-Capillary: 159 mg/dL — ABNORMAL HIGH (ref 70–99)
Glucose-Capillary: 166 mg/dL — ABNORMAL HIGH (ref 70–99)
Glucose-Capillary: 170 mg/dL — ABNORMAL HIGH (ref 70–99)
Glucose-Capillary: 181 mg/dL — ABNORMAL HIGH (ref 70–99)

## 2020-01-08 LAB — BLOOD GAS, ARTERIAL
Acid-base deficit: 6.5 mmol/L — ABNORMAL HIGH (ref 0.0–2.0)
Bicarbonate: 19.4 mmol/L — ABNORMAL LOW (ref 20.0–28.0)
FIO2: 100
O2 Saturation: 95.3 %
Patient temperature: 37
pCO2 arterial: 44.6 mmHg (ref 32.0–48.0)
pH, Arterial: 7.261 — ABNORMAL LOW (ref 7.350–7.450)
pO2, Arterial: 86 mmHg (ref 83.0–108.0)

## 2020-01-08 LAB — CBC
HCT: 33.8 % — ABNORMAL LOW (ref 39.0–52.0)
Hemoglobin: 10.5 g/dL — ABNORMAL LOW (ref 13.0–17.0)
MCH: 26.6 pg (ref 26.0–34.0)
MCHC: 31.1 g/dL (ref 30.0–36.0)
MCV: 85.8 fL (ref 80.0–100.0)
Platelets: 138 10*3/uL — ABNORMAL LOW (ref 150–400)
RBC: 3.94 MIL/uL — ABNORMAL LOW (ref 4.22–5.81)
RDW: 18.1 % — ABNORMAL HIGH (ref 11.5–15.5)
WBC: 12.3 10*3/uL — ABNORMAL HIGH (ref 4.0–10.5)
nRBC: 1.3 % — ABNORMAL HIGH (ref 0.0–0.2)

## 2020-01-08 LAB — POCT I-STAT 7, (LYTES, BLD GAS, ICA,H+H)
Acid-base deficit: 3 mmol/L — ABNORMAL HIGH (ref 0.0–2.0)
Bicarbonate: 22.8 mmol/L (ref 20.0–28.0)
Calcium, Ion: 0.78 mmol/L — CL (ref 1.15–1.40)
HCT: 30 % — ABNORMAL LOW (ref 39.0–52.0)
Hemoglobin: 10.2 g/dL — ABNORMAL LOW (ref 13.0–17.0)
O2 Saturation: 100 %
Patient temperature: 38
Potassium: 5 mmol/L (ref 3.5–5.1)
Sodium: 139 mmol/L (ref 135–145)
TCO2: 24 mmol/L (ref 22–32)
pCO2 arterial: 45.2 mmHg (ref 32.0–48.0)
pH, Arterial: 7.317 — ABNORMAL LOW (ref 7.350–7.450)
pO2, Arterial: 242 mmHg — ABNORMAL HIGH (ref 83.0–108.0)

## 2020-01-08 LAB — COMPREHENSIVE METABOLIC PANEL
ALT: 184 U/L — ABNORMAL HIGH (ref 0–44)
AST: 400 U/L — ABNORMAL HIGH (ref 15–41)
Albumin: 1.9 g/dL — ABNORMAL LOW (ref 3.5–5.0)
Alkaline Phosphatase: 182 U/L — ABNORMAL HIGH (ref 38–126)
Anion gap: 19 — ABNORMAL HIGH (ref 5–15)
BUN: 126 mg/dL — ABNORMAL HIGH (ref 8–23)
CO2: 19 mmol/L — ABNORMAL LOW (ref 22–32)
Calcium: 7.5 mg/dL — ABNORMAL LOW (ref 8.9–10.3)
Chloride: 100 mmol/L (ref 98–111)
Creatinine, Ser: 7.12 mg/dL — ABNORMAL HIGH (ref 0.61–1.24)
GFR calc Af Amer: 9 mL/min — ABNORMAL LOW (ref >60)
GFR calc non Af Amer: 7 mL/min — ABNORMAL LOW (ref >60)
Glucose, Bld: 180 mg/dL — ABNORMAL HIGH (ref 70–99)
Potassium: 5.3 mmol/L — ABNORMAL HIGH (ref 3.5–5.1)
Sodium: 138 mmol/L (ref 135–145)
Total Bilirubin: 4.8 mg/dL — ABNORMAL HIGH (ref 0.3–1.2)
Total Protein: 5.9 g/dL — ABNORMAL LOW (ref 6.5–8.1)

## 2020-01-08 LAB — TRIGLYCERIDES: Triglycerides: 294 mg/dL — ABNORMAL HIGH (ref <150)

## 2020-01-08 MED ORDER — MAGNESIUM SULFATE 2 GM/50ML IV SOLN
2.0000 g | Freq: Once | INTRAVENOUS | Status: AC
  Administered 2020-01-08: 2 g via INTRAVENOUS
  Filled 2020-01-08: qty 50

## 2020-01-08 MED ORDER — SODIUM ZIRCONIUM CYCLOSILICATE 10 G PO PACK
10.0000 g | PACK | Freq: Once | ORAL | Status: AC
  Administered 2020-01-08: 10 g
  Filled 2020-01-08: qty 1

## 2020-01-08 MED ORDER — NOREPINEPHRINE 16 MG/250ML-% IV SOLN
0.0000 ug/min | INTRAVENOUS | Status: DC
  Administered 2020-01-08: 20 ug/min via INTRAVENOUS
  Administered 2020-01-08: 40 ug/min via INTRAVENOUS
  Administered 2020-01-09: 21.973 ug/min via INTRAVENOUS
  Filled 2020-01-08 (×3): qty 250

## 2020-01-08 MED ORDER — FUROSEMIDE 10 MG/ML IJ SOLN
160.0000 mg | Freq: Once | INTRAVENOUS | Status: AC
  Administered 2020-01-08: 160 mg via INTRAVENOUS
  Filled 2020-01-08: qty 16

## 2020-01-08 MED ORDER — NOREPINEPHRINE 4 MG/250ML-% IV SOLN: 0.0000 ug/min | INTRAVENOUS | Status: DC

## 2020-01-08 MED ORDER — PHENYLEPHRINE CONCENTRATED 100MG/250ML (0.4 MG/ML) INFUSION SIMPLE
0.0000 ug/min | INTRAVENOUS | Status: DC
  Administered 2020-01-08 (×2): 400 ug/min via INTRAVENOUS
  Administered 2020-01-09: 200 ug/min via INTRAVENOUS
  Filled 2020-01-08 (×6): qty 250

## 2020-01-08 MED ORDER — CHLORHEXIDINE GLUCONATE 0.12% ORAL RINSE (MEDLINE KIT)
15.0000 mL | Freq: Two times a day (BID) | OROMUCOSAL | Status: DC
  Administered 2020-01-08 – 2020-01-09 (×2): 15 mL via OROMUCOSAL

## 2020-01-08 MED ORDER — FUROSEMIDE 10 MG/ML IJ SOLN
INTRAMUSCULAR | Status: AC
  Administered 2020-01-08: 80 mg via INTRAVENOUS
  Filled 2020-01-08: qty 8

## 2020-01-08 MED ORDER — ORAL CARE MOUTH RINSE
15.0000 mL | OROMUCOSAL | Status: DC
  Administered 2020-01-08 – 2020-01-09 (×7): 15 mL via OROMUCOSAL

## 2020-01-08 MED ORDER — FUROSEMIDE 10 MG/ML IJ SOLN: 80.0000 mg | Freq: Once | INTRAMUSCULAR | Status: AC

## 2020-01-08 NOTE — Progress Notes (Signed)
eLink Physician-Brief Progress Note Patient Name: Colin [name] DOB: [birth year] MRN: [id number]   Date of Service  01/08/2020  HPI/Events of Note  No response to Lasix 80 mg IV.   eICU Interventions  Plan: 1. Lasix 160 mg IV X 1 now.     Intervention Category Major Interventions: Hypoxemia - evaluation and management  Cariah Salatino Eugene 01/08/2020, 6:41 AM

## 2020-01-08 NOTE — Care Plan (Signed)
Repeat ABG with PH 7.25, Dr. Arsenio Loader notified, new order for Bicarb gtt  received

## 2020-01-08 NOTE — Progress Notes (Addendum)
NAME:  Colin [name], MRN:  [id number], DOB:  11-Sep-1955, LOS: 6 ADMISSION DATE:  01-02-20, CONSULTATION DATE:  01/08/20 REFERRING MD:  EDP, CHIEF COMPLAINT:  Cardiac arrest   Brief History   64 year old male past medical history of A. fib/a flutter on Eliquis, cardiomyopathy, hypertension, obesity, OSA who had a witnessed out of hospital V. fib arrest with 1 hour of CPR before ROSC obtained.  Intubated and PCCM consulted for admission  History of present illness   [name] is a 64 year old male with past medical history of A. fib/a flutter s/p ablation on Eliquis, dilated aortic root cardiomyopathy, hypertension, obesity, OSA who has been in his usual state of health recently, though his wife noted he seemed to be breathing heavier and was more tired than usual for about the last day.  She says he never complains, just went to bed early.  This evening she was talking to him when he collapsed in front of her.  She called 911 and started chest compressions.  EMS found patient in V-fib, he was intubated and defibrillated multiple times, given amiodarone and a total of 7 mg of epi was obtained, estimated 1 hour of ACLS.  On arrival to the ED, patient was unresponsive, head CT without acute findings.  EKG with new anterior ST depressions and troponin 1,600.  Cardiology was consulted and confirmed no STEMI. blood work significant for lactic acidosis and leukocytosis with AKI and elevated LFTs.  Covid-19 negative no other source of infection noted.  PCCM consulted for admission  Past Medical History   has a past medical history of Atrial flutter (HCC), DCM (dilated cardiomyopathy) (HCC), Dilated aortic root (HCC) (04/15/2015), Hypertension, Obesities, morbid (HCC), OSA (obstructive sleep apnea), and Persistent atrial fibrillation (HCC).   Significant Hospital Events   8/28 Admit to PCCM  Consults:  Cardiology, neuro.  Procedures:  8/27 ETT  Significant Diagnostic Tests:  8/27 CXR>>Central  vascular congestion consistent with the recent CPR. 8/28 CT head>>Mild age-related atrophy and chronic microvascular ischemic changes. Old right occipital infarct. 8/29 EEG > profound diffuse encephalopathy without seizure or epileptiform discharges. 8/31 Echo > EF 35%, mildly reduced RV function. 8/31 Brain MRI > large rigtht PCA territory infarct.  Absent right PCA flow c/w occlusion, small acute ischemia in left cerebellum and left occipital lobes.  Micro Data:  8/28 Sars-Cov-2>>negative  Antimicrobials:  Unasyn 8/28 >>   Interim history/subjective:  Pt remains very unstable  Bradycardia overnight with worsening metabolic/ respiratory  acidosis, renal failure, hypotension, multi organ failure Added Levo and bicarb gtt. Levo now at 40 mcg and Neo at 400 PH is 7.26/44/86/19.4 K 5.3, Creatinine 7.12 20 cc's of UO overnight, Net + 10 L Pt. Has been off Fentanyl since 9/3 @ 07:30 am He is unresponsive, he does overbreathe the vent WBC is 12.3 on 9/3 T max is 101.3 Amio off for hypotension  LFT's have bumped again overnight 9/3  Objective   Blood pressure (!) 100/57, pulse (!) 49, temperature 100 F (37.8 C), resp. rate (!) 39, height 5\' 11"  (1.803 m), weight 122.9 kg, SpO2 91 %. CVP:  [0 mmHg-37 mmHg] 33 mmHg  Vent Mode: PRVC FiO2 (%):  [100 %] 100 % Set Rate:  [28 bmp-35 bmp] 35 bmp Vt Set:  [530 mL] 530 mL PEEP:  [16 cmH20] 16 cmH20 Plateau Pressure:  [24 cmH20-37 cmH20] 35 cmH20   Intake/Output Summary (Last 24 hours) at 01/08/2020 0911 Last data filed at 01/08/2020 0900 Gross per 24 hour  Intake  4510.49 ml  Output 30 ml  Net 4480.49 ml   Filed Weights   01/05/20 0500 01/06/20 0100 01/07/20 0600  Weight: 116.7 kg 117.9 kg 122.9 kg   Physical Exam General: Critically ill Middle-age male lying in bed HEENT: ETT, MM pink/moist, PERRL, pupils pinpoint Neuro: Sedated on ventilator, Unresponsive to deep sternal rub and deep trap pinching CV: s1s2 , PAC's per tele, brady  and irregular, no murmur, rubs, or gallops,  PULM: Bilateral chest excursion, Coarse breath sounds throughout, no added breath sounds, thick secretions , mild vent dyssynchrony, Oxygen sats 89-90% GI: Soft, , bowel sounds active in all 4 quadrants, non-tender, non-distended,  Extremities: cold to touch,  Trace edema edema, no obvious deformities  Skin: no rashes or lesions   Assessment & Plan:   Acute respiratory failure with hypoxemia, ventilator dependence ARDS physiology, suspect aspiration pneumonia>> Multifocal pneumonia -In the setting of witnessed V. fib arrest and suspected aspiration pneumonia New Bradycardia 9/3 P: Continue ventilator support with lung protective strategies  Wean PEEP and FiO2 for sats greater than 90%. Head of bed elevated 30 degrees. Plateau pressures less than 30 cm H20.  Follow intermittent chest x-ray and ABG.   SAT/SBT as tolerated, mentation preclude extubation  Ensure adequate pulmonary hygiene  Follow cultures  VAP bundle in place  PAD protocol Continue empiric Unasyn for 7 days, tentative stop date 9/3 May need bronch if continues to desaturate to assess for plugging Will get sputum for culture  Witnessed, out of hospital V. fib cardiac arrest  - 1 hour ACLS before ROSC, CT head negative.  Underlying cause unclear, possible non-STEMI.  Doubt PE since he is on chronic anticoagulation. MRI did not suggest anoxic encephalopathy as suspected but did reveal acute infarcts (see below). P: Avoid fevers with goal of normothermia Supportive care Continuous telemetry Appreciate cards assistance   NSTEMI  -Troponin peaked at greater than 27,000 History hypertension Chronic atrial fibrillation -Patient is chronically anticoagulated with p.o. Eliquis at baseline and is on chronic beta-blocker History of dilated cardiomyopathy   -Echocardiogram last year revealed normalization of ejection fraction but new echo 8/31 with EF back down to 35  % Hypotension>> Levo and Neo added 9/2 and 9/3 P: Continue aspirin  Cardiology following, appreciate assistance Strict intake and output Daily weight Continuous telemetry amiodarone and metoprolol on hold for bradycardia and hypotension IV heparin drip on hold due to acute stroke Maxed out on both Levo and Neo at present  Acute kidney injury ATN due to arrest, nonoliguric. Hyperkalemia -Creatinine 0.92 11/20/2019, creatinine on admission 1.94.   Unfortunately creatinine doubled to 4.92 morning of 9/2, and now is 7.12 on 9/3 Only 20 cc UO las 12 hours 9/3 PH of 7.26 with bicarb of 19.4, PCO2 of 44.6 P: Monitor urine output Avoid nephrotoxins Trend Bmet Bicarb gtt Dialysis in light of neuro status and muti organ failure would be futile   Elevated LFTs, likely shock liver  - LFT's have bumped overnight  AST to 400 - ALT to 184 P: Continue to  follow LFTs Avoid hepatotoxins as able Hold amiodarone, and Lipitor  Hyperglycemia P: Continue SSI Continue long-acting insulin  Hypothyroidism. P: Continue home Synthroid  Right PCA infarct with absent right PCA flow, left cerebellar and occipital lobe infarcts. P: Neurology following, appreciate assistance Heparin drip remains on hold per stroke team Maintain neuro protective measures; goal for eurothermia, euglycemia, eunatermia, normoxia, and PCO2 goal of 35-40 Nutrition and bowel regiment  Seizure precautions  Sedation off for neuro  prognostication unless sats drop below 88%  Fever  And Leukocytosis - consider neuro storm given his intermittent tachycardia/tachypnea/fever/etc, but not convinced at this point P: Continue to new cooling blanket/Tylenol as needed Appreciate urology's assistance in managing Trend WBC and Fever Curve Antibiotics escalated 9/2  Worsening multi organ failure over night Maxed out on 2 pressors, bicarb added Remains a full code >> Dr. Chestine Spore has updated patient family in full.   Best  practice:  Diet: TF's Pain/Anxiety/Delirium protocol (if indicated): fentanyl gtt / propofol gtt/ VAP protocol (if indicated): HOB 30 degrees, suction as needed DVT prophylaxis: Heparin GI prophylaxis: Protonix Glucose control: SSI Mobility: Bedrest Code Status: Full code Family Communication: Will continue to update wife at bedside once she arrives.  Family hoping for a miracle. Disposition: ICU   Critical care time:    Performed by: Bevelyn Ngo  Total critical care time: 32 minutes  Critical care time was exclusive of separately billable procedures and treating other patients.  Critical care was necessary to treat or prevent imminent or life-threatening deterioration.  Critical care was time spent personally by me on the following activities: development of treatment plan with patient and/or surrogate as well as nursing, discussions with consultants, evaluation of patient's response to treatment, examination of patient, obtaining history from patient or surrogate, ordering and performing treatments and interventions, ordering and review of laboratory studies, ordering and review of radiographic studies, pulse oximetry and re-evaluation of patient's condition.  Bevelyn Ngo, MSN, AGACNP-BC [facility] See Amion for personal pager PCCM on call pager [phone] 01/08/2020, 9:11 AM

## 2020-01-08 NOTE — Progress Notes (Signed)
eLink Physician-Brief Progress Note Patient Name: HRISTOPHER MISSILDINE DOB: Aug 29, 1955 MRN: [id number]   Date of Service  01/08/2020  HPI/Events of Note  ABG on 90%/PRVC 35/TV 530/P 16 = 7.317/45.2/242  eICU Interventions  Continue present ventilator management.      Intervention Category Major Interventions: Respiratory failure - evaluation and management  Chaitra Mast Eugene 01/08/2020, 9:06 PM

## 2020-01-08 NOTE — Progress Notes (Signed)
Progress Note  Patient Name: Colin [name] Date of Encounter: 01/08/2020  [facility] Cardiologist: Armanda Magic, MD   Subjective   Pt intubated, sedated  Inpatient Medications    Scheduled Meds: . acetaminophen  650 mg Oral Q4H   Or  . acetaminophen (TYLENOL) oral liquid 160 mg/5 mL  650 mg Per Tube Q4H   Or  . acetaminophen  650 mg Rectal Q4H  . aspirin  324 mg Per Tube Daily  . atorvastatin  80 mg Per Tube Daily  . Chlorhexidine Gluconate Cloth  6 each Topical Daily  . docusate  100 mg Per Tube BID  . feeding supplement (PROSource TF)  90 mL Per Tube TID  . feeding supplement (VITAL HIGH PROTEIN)  1,000 mL Per Tube Q24H  . heparin injection (subcutaneous)  5,000 Units Subcutaneous Q8H  . insulin aspart  0-15 Units Subcutaneous Q4H  . insulin aspart  4 Units Subcutaneous Q4H  . insulin glargine  10 Units Subcutaneous BID  . levothyroxine  200 mcg Per Tube Once per day on Mon Tue Wed Thu Fri Sat   And  . [START ON 01/10/2020] levothyroxine  100 mcg Per Tube Once per day on Sun  . mouth rinse  15 mL Mouth Rinse 10 times per day  . pantoprazole (PROTONIX) IV  40 mg Intravenous Daily  . polyethylene glycol  17 g Per Tube BID  . sennosides  5 mL Per Tube BID  . sodium chloride flush  10-40 mL Intracatheter Q12H  . vancomycin variable dose per unstable renal function (pharmacist dosing)   Does not apply See admin instructions   Continuous Infusions: . sodium chloride    . amiodarone 30 mg/hr (01/08/20 0700)  . ceFEPime (MAXIPIME) IV 2 g (01/07/20 1604)  . clevidipine Stopped (01/06/20 2158)  . fentaNYL infusion INTRAVENOUS 200 mcg/hr (01/08/20 0700)  . furosemide    . norepinephrine (LEVOPHED) Adult infusion    . phenylephrine (NEO-SYNEPHRINE) Adult infusion 400 mcg/min (01/08/20 0700)  .  sodium bicarbonate (isotonic) infusion in sterile water 50 mL/hr at 01/08/20 0700   PRN Meds: labetalol, sodium chloride flush   Vital Signs    Vitals:   01/08/20 0630  01/08/20 0645 01/08/20 0700 01/08/20 0715  BP:      Pulse: 61 (!) 59 (!) 59 (!) 59  Resp: (!) 37 (!) 38 (!) 37 (!) 37  Temp: 99.3 F (37.4 C) 99.5 F (37.5 C) 99.7 F (37.6 C) 99.7 F (37.6 C)  TempSrc:      SpO2: 94% 94% 94% 95%  Weight:      Height:        Intake/Output Summary (Last 24 hours) at 01/08/2020 0815 Last data filed at 01/08/2020 0700 Gross per 24 hour  Intake 4404.97 ml  Output 30 ml  Net 4374.97 ml   Last 3 Weights 01/07/2020 01/06/2020 01/05/2020  Weight (lbs) 270 lb 15.1 oz 259 lb 14.8 oz 257 lb 4.4 oz  Weight (kg) 122.9 kg 117.9 kg 116.7 kg      Telemetry    Atrial fib, slow vent response - Personally Reviewed   Physical Exam    General: obese intubated SKIN: warm, dry.  Neuro: sedated Psychiatric: sedated Lungs:diffuse rhonci bilateral lungs.  Cardiovascular: Huston Foley, irregular Abdomen:Soft.  Extremities: 1-2+ bilateral LE edema   Labs    High Sensitivity Troponin:   Recent Labs  Lab 01-30-2020 2333 01/02/20 0440  TROPONINIHS 1,600* >27,000*      Chemistry Recent Labs  Lab 01/02/20  2694 01/02/20 0510 01/03/20 0347 01/03/20 0357 01/04/20 0402 01/04/20 0402 01/05/20 0236 01/05/20 0236 01/06/20 0315 01/06/20 1150 01/07/20 0453 01/07/20 1924 01/07/20 2110  NA 137   < > 136   < > 140   < > 140   < > 137   < > 138 136 137  K 3.7   < > 4.4   < > 4.2   < > 4.0   < > 3.6   < > 4.5 5.4* 5.1  CL 101   < > 104   < > 102   < > 103  --  102  --  101  --   --   CO2 20*   < > 20*   < > 26   < > 24  --  23  --  21*  --   --   GLUCOSE 264*   < > 286*   < > 169*   < > 242*  --  232*  --  196*  --   --   BUN 19   < > 34*   < > 34*   < > 41*  --  68*  --  95*  --   --   CREATININE 2.40*   < > 3.06*   < > 2.32*   < > 2.07*  --  2.57*  --  4.92*  --   --   CALCIUM 8.9   < > 7.9*   < > 7.8*   < > 8.3*  --  7.9*  --  8.3*  --   --   PROT 6.4*  --  5.9*  --  5.6*  --   --   --   --   --   --   --   --   ALBUMIN 3.6  --  3.0*  --  2.7*  --   --   --   --    --   --   --   --   AST 332*  --  339*  --  213*  --   --   --   --   --   --   --   --   ALT 185*  --  142*  --  105*  --   --   --   --   --   --   --   --   ALKPHOS 118  --  71  --  74  --   --   --   --   --   --   --   --   BILITOT 1.2  --  1.4*  --  1.9*  --   --   --   --   --   --   --   --   GFRNONAA 28*   < > 21*   < > 29*   < > 33*  --  25*  --  12*  --   --   GFRAA 32*   < > 24*   < > 33*   < > 38*  --  30*  --  13*  --   --   ANIONGAP 16*   < > 12   < > 12   < > 13  --  12  --  16*  --   --    < > = values in this interval not displayed.     Hematology  Recent Labs  Lab 01/05/20 0236 01/05/20 0236 01/06/20 0315 01/06/20 1150 01/07/20 0453 01/07/20 1924 01/07/20 2110  WBC 8.5  --  6.5  --  5.4  --   --   RBC 4.84  --  4.23  --  4.07*  --   --   HGB 13.2   < > 11.5*   < > 11.0* 11.2* 10.9*  HCT 41.2   < > 36.2*   < > 35.3* 33.0* 32.0*  MCV 85.1  --  85.6  --  86.7  --   --   MCH 27.3  --  27.2  --  27.0  --   --   MCHC 32.0  --  31.8  --  31.2  --   --   RDW 16.1*  --  16.6*  --  17.5*  --   --   PLT 101*  --  87*  --  92*  --   --    < > = values in this interval not displayed.    BNPNo results for input(s): BNP, PROBNP in the last 168 hours.   DDimer No results for input(s): DDIMER in the last 168 hours.   Radiology    DG Abd 1 View  Result Date: 01/07/2020 CLINICAL DATA:  Feeding tube placement EXAM: ABDOMEN - 1 VIEW COMPARISON:  None. FINDINGS: Orogastric tube is in place with its tip overlying the expected distal body of the stomach. Normal abdominal gas pattern. No gross free intraperitoneal gas. IMPRESSION: Orogastric tube tip overlies the expected distal body of the stomach. Electronically Signed   By: Helyn Numbers MD   On: 01/07/2020 22:38   DG CHEST PORT 1 VIEW  Result Date: 01/07/2020 CLINICAL DATA:  Cardiac arrest, central line placement EXAM: PORTABLE CHEST 1 VIEW COMPARISON:  5:14 a.m. FINDINGS: The lungs are symmetrically well expanded. Extensive  bilateral airspace infiltrates are again identified in keeping with multifocal pneumonia, ARDS, or, less likely, asymmetric alveolar pulmonary edema. Endotracheal tube is seen at the level of the clavicular heads 8.8 cm above the carina. Nasogastric tube extends into the upper abdomen beyond the margin of the examination. Left internal jugular central venous catheter is unchanged with its tip oriented horizontally within the proximal superior vena cava, likely at the junction of the brachiocephalic vein and superior vena cava. No pneumothorax or pleural effusion. Mild cardiomegaly is stable. No acute bone abnormality. IMPRESSION: 1. Persistent extensive bilateral airspace infiltrates in keeping with multifocal pneumonia, ARDS, or, less likely, asymmetric alveolar pulmonary edema. 2. Stable support apparatus. Left internal jugular central venous catheter tip unchanged, oriented horizontally within the proximal SVC. 3. No pneumothorax or pleural effusion. Electronically Signed   By: Helyn Numbers MD   On: 01/07/2020 22:37    Cardiac Studies   Echo 01/05/20:  1. Left ventricular ejection fraction, by estimation, is 35%. The left  ventricle has moderately decreased function. The left ventricle  demonstrates global hypokinesis. Left ventricular diastolic parameters are  indeterminate.  2. Right ventricular systolic function is mildly reduced. The right  ventricular size is mildly enlarged. There is moderately elevated  pulmonary artery systolic pressure. The estimated right ventricular  systolic pressure is 45.0 mmHg.  3. Left atrial size was mildly dilated.  4. Right atrial size was mildly dilated.  5. The mitral valve is normal in structure. Mild mitral valve  regurgitation. No evidence of mitral stenosis.  6. The aortic valve was not well visualized. Aortic valve regurgitation  is trivial. No  aortic stenosis is present.  7. Aortic dilatation noted. There is mild dilatation of the ascending    aorta measuring 43 mm.  8. The inferior vena cava is dilated in size with <50% respiratory  variability, suggesting right atrial pressure of 15 mmHg.  9. The patient was in atrial fibrillation.   Patient Profile     64 y.o. male with out of hospital V. fib cardiac arrest requiring 1 hour of ACLS before ROSC.  Assessment & Plan    1.  Non-STEMI: Troponin greater than 27,000. LVEF 35% on echo. History of nonischemic cardiomyopathy that normalized on last year's echo study.  We had planned to consider an ischemic evaluation if he had neurological recovery. Continue ASA.    2.  Acute respiratory failure with hypoxemia: Pt became hypotensive with difficulty oxygenating this am. Chest x-ray with diffuse bilateral infiltrates, presumed ARDS. IV Lasix per primary team. Worsened renal function. Antibiotics per primary team.   3.  Ventricular fibrillation: Considerations include LV dysfunction related to nonischemic cardiomyopathy versus ACS/non-STEMI.  Considering current situation, treatment is conservative at this time.   4.  Anoxic encephalopathy: Large right PCA territory acute infarct. Prognosis guarded  6. Atrial fibrillation: Rate controlled. Continue amiodarone drip. D/c beta blocker.   Prognosis is guarded. Now with multi-organ failure.       For questions or updates, please contact [facility] Please consult [email or web address] for contact info under        Signed, Verne Carrowhristopher Raneem Mendolia, MD  01/08/2020, 8:15 AM

## 2020-01-08 NOTE — Progress Notes (Signed)
eLink Physician-Brief Progress Note Patient Name: Colin [name] DOB: Feb 14, 1956 MRN: [id number]   Date of Service  01/08/2020  HPI/Events of Note  Hypoxia - sats decreased into low 80's. CXR as described previously. CVP = 24. Creatinine = 4.92. Ppeak = 33-35 and Pplat = 34. No room to increase PEEP.  eICU Interventions  Plan: 1. Lasix 80 mg IV X 1 now.       Intervention Category Major Interventions: Hypoxemia - evaluation and management;Respiratory failure - evaluation and management  Colin [name] 01/08/2020, 4:53 AM

## 2020-01-08 NOTE — Progress Notes (Addendum)
Heart rhythm fluctuating between SB, Heart block 3rd degree, and Afib RVR - see flowsheet.   Pads placed and MD aware- paced temporarily.    Tolerating MV: PRVC, 90% FiO2/ 16PEEP/ 35RR/ 530Vt  Spontaneous breaths noted, Dysynchronous noted throughout day - see MAR   -cough -gag +corneal R/L  -pupilary responses No withdrawal/response to painful stimuli Cooling blanket applied for temps >36.5   Gtts: Neo, Levo, Fentanyl titrating Amio paused/off - AM (see MAR) Lasix gtt x1 Mg+ repleted  Family conference: wife at hospital to conference room / daughter on speaker phone. Neuro and RN present.  DNR status established, family stated all questions answered, status reviewed, discussed comfort care.

## 2020-01-08 NOTE — Care Plan (Signed)
O2 Stat's decreasing, Neo and fentanyl at max dose, ABG sent. E-Link called with update on pt. Order given for Lasix

## 2020-01-08 NOTE — Care Plan (Signed)
SB to 40's, Hypotensive,  CVP elevated, O2 Sat's low, UO remains low. ,E-Link called, Propofol stopped, Neo started. See additional new orders  Pt's wife updated on pt's status

## 2020-01-08 NOTE — Care Plan (Signed)
UO 10cc  Post Lasix dose. E-Link notifed. Waiting for new orders

## 2020-01-08 NOTE — Plan of Care (Signed)
Called wife Dois Davenport to give her an update. Increasing vasopressor requirements. Anuric. All questions answered. Family will make a decision tomorrow regarding if/ when they will terminally extubate.  Steffanie Dunn, DO 01/08/20 6:59 PM  Pulmonary & Critical Care

## 2020-01-08 NOTE — Progress Notes (Signed)
STROKE TEAM PROGRESS NOTE   INTERVAL HISTORY Patient condition with decline over the last couple of days and now is in refractory heart failure on maximum doses of 2 pressors.  His propofol was stopped yesterday and fentanyl this morning he seems to be in significant respiratory distress.  Neurological exam is unchanged with only trace corneal reflexes and some respiratory effort and.. Patient remains intubated for respiratory failure  . Vitals:   01/08/20 1045 01/08/20 1100 01/08/20 1115 01/08/20 1200  BP:      Pulse: 69 69 70 67  Resp: (!) 39 (!) 43 (!) 44 (!) 50  Temp: (!) 100.8 F (38.2 C) (!) 100.8 F (38.2 C) (!) 100.8 F (38.2 C) (!) 100.6 F (38.1 C)  TempSrc: Esophageal Esophageal Esophageal Esophageal  SpO2: 95% 95% 95% 98%  Weight:      Height:       CBC:  Recent Labs  Lab 12/15/2019 2333 01/02/20 0031 01/07/20 0453 01/07/20 1924 01/07/20 2110 01/08/20 0409  WBC 20.9*   < > 5.4  --   --  12.3*  NEUTROABS 11.0*  --   --   --   --   --   HGB 16.6   < > 11.0*   < > 10.9* 10.5*  HCT 52.9*   < > 35.3*   < > 32.0* 33.8*  MCV 88.5   < > 86.7  --   --  85.8  PLT 220   < > 92*  --   --  138*   < > = values in this interval not displayed.   Basic Metabolic Panel:  Recent Labs  Lab 01/06/20 0315 01/06/20 1150 01/07/20 0453 01/07/20 1924 01/07/20 2110 01/08/20 0409  NA 137   < > 138   < > 137 138  K 3.6   < > 4.5   < > 5.1 5.3*  CL 102  --  101  --   --  100  CO2 23  --  21*  --   --  19*  GLUCOSE 232*  --  196*  --   --  180*  BUN 68*  --  95*  --   --  126*  CREATININE 2.57*  --  4.92*  --   --  7.12*  CALCIUM 7.9*  --  8.3*  --   --  7.5*  MG 2.6*  --  2.7*  --   --   --   PHOS 3.2  --  5.0*  --   --   --    < > = values in this interval not displayed.   Lipid Panel:  Recent Labs  Lab 01/08/20 0409  TRIG 294*   HgbA1c:  Recent Labs  Lab 01/02/20 0440  HGBA1C 7.5*   Urine Drug Screen:  Recent Labs  Lab 01/02/20 0247  LABOPIA NONE DETECTED   COCAINSCRNUR NONE DETECTED  LABBENZ POSITIVE*  AMPHETMU NONE DETECTED  THCU NONE DETECTED  LABBARB NONE DETECTED    Alcohol Level No results for input(s): ETH in the last 168 hours.  IMAGING past 24 hours DG Abd 1 View  Result Date: 01/07/2020 CLINICAL DATA:  Feeding tube placement EXAM: ABDOMEN - 1 VIEW COMPARISON:  None. FINDINGS: Orogastric tube is in place with its tip overlying the expected distal body of the stomach. Normal abdominal gas pattern. No gross free intraperitoneal gas. IMPRESSION: Orogastric tube tip overlies the expected distal body of the stomach. Electronically Signed   By: Gloris Ham  Ramiro Harvest MD   On: 01/07/2020 22:38   DG CHEST PORT 1 VIEW  Result Date: 01/07/2020 CLINICAL DATA:  Cardiac arrest, central line placement EXAM: PORTABLE CHEST 1 VIEW COMPARISON:  5:14 a.m. FINDINGS: The lungs are symmetrically well expanded. Extensive bilateral airspace infiltrates are again identified in keeping with multifocal pneumonia, ARDS, or, less likely, asymmetric alveolar pulmonary edema. Endotracheal tube is seen at the level of the clavicular heads 8.8 cm above the carina. Nasogastric tube extends into the upper abdomen beyond the margin of the examination. Left internal jugular central venous catheter is unchanged with its tip oriented horizontally within the proximal superior vena cava, likely at the junction of the brachiocephalic vein and superior vena cava. No pneumothorax or pleural effusion. Mild cardiomegaly is stable. No acute bone abnormality. IMPRESSION: 1. Persistent extensive bilateral airspace infiltrates in keeping with multifocal pneumonia, ARDS, or, less likely, asymmetric alveolar pulmonary edema. 2. Stable support apparatus. Left internal jugular central venous catheter tip unchanged, oriented horizontally within the proximal SVC. 3. No pneumothorax or pleural effusion. Electronically Signed   By: Helyn Numbers MD   On: 01/07/2020 22:37    PHYSICAL EXAM : Patient is  intubated on fentanyl drips. . Afebrile. Head is nontraumatic. Neck is supple without bruit.    Cardiac exam no murmur or gallop. Lungs are clear to auscultation. Distal pulses are well felt. Neurological Exam : Patient is intubated and sedated on   fentanyl drip   Eyes are closed.  He is unresponsive.  Pupils are 2 mm unreactive bilaterally.  Corneal reflexes are sluggish bilaterally.  Doll's eye movements are absent.  There is absent cough and gag.  He does have some ventilatory effort.  No response to sternal rub or nailbed pressure.  Plantars are mute. ASSESSMENT/PLAN Colin [name] is a 64 y.o. male with past for history of atrial fibrillation/atrial flutter status post ablation on Eliquis, dilated aortic root cardiomyopathy, hypertension, obstructive sleep apnea who has been at his usual state of health until his wife noted him breathing heavier and he was more tired than usual.  On the evening of 12/28/2019 patient collapsed in front of her.  She called 911 started chest compressions.  EMS found patient to be in V. fib.  It was estimated that 1 hour of ACLS was obtained prior to ROSC.  Patient's target was normothermia at that time.  During work-up to evaluate for hypoxic injury it was noted that patient had a right PCA infarct.  Thus neurology was consulted.   Stroke:  right PCA infarct embolic secondary to V. fib cardiac arrest.  He also has atrial fibrillation which could have also contributed to his embolic stroke Code Stroke 1. No acute intracranial pathology. 2. Mild age-related atrophy and chronic microvascular ischemic changes. Old right occipital infarct.   CTA head and neck not obtained d/t poor kidney function; and patient condition most consistent with hypoxic brain injury.   MRI  1. Large right PCA territory acute infarct. No hemorrhage or mass effect. Absent right PCA flow void consistent with occlusion. Small foci of acute ischemia in the left cerebellum and left occipital  lobe.  2D Echo 1. Left ventricular ejection fraction, by estimation, is 35%. The left  ventricle has moderately decreased function. The left ventricle  demonstrates global hypokinesis. Left ventricular diastolic parameters are  indeterminate.  2. Right ventricular systolic function is mildly reduced. The right  ventricular size is mildly enlarged. There is moderately elevated  pulmonary artery systolic pressure. The estimated  right ventricular  systolic pressure is 45.0 mmHg.  3. Left atrial size was mildly dilated.  4. Right atrial size was mildly dilated.  5. The mitral valve is normal in structure. Mild mitral valve  regurgitation. No evidence of mitral stenosis.  6. The aortic valve was not well visualized. Aortic valve regurgitation  is trivial. No aortic stenosis is present.  7. Aortic dilatation noted. There is mild dilatation of the ascending  aorta measuring 43 mm.  8. The inferior vena cava is dilated in size with <50% respiratory  variability, suggesting right atrial pressure of 15 mmHg.  9. The patient was in atrial fibrillation.   LDL 77  HgbA1c 7.5  VTE prophylaxis - Heparin and SCD's    Diet   Diet NPO time specified    Eliquis (apixaban) daily prior to admission, now on heparin IV held on 01/06/20 per primary team.   Therapy recommendations:  Pending  Disposition:  TBD  Hypertension  Home meds:  Metoprolol 50 mg BID  Unstable . Permissive hypertension (OK if < 220/120) but gradually normalize in 5-7 days . Long-term BP goal normotensive  Hyperlipidemia  Home meds:  none   LDL 77, goal < 70  Add atorvastatin 80 mg  Continue statin at discharge  Diabetes type II Uncontrolled  Home meds:  Ozempic, farxiga, toujeo  HgbA1c 7.5, goal < 7.0  CBGs Recent Labs    01/08/20 0013 01/08/20 0419 01/08/20 0730  GLUCAP 170* 159* 141*      SSI  Other Stroke Risk Factors  Obesity, Body mass index is 37.79 kg/m., recommend weight loss,  diet and exercise as appropriate   Family hx stroke (mother)  Obstructive sleep apnea,   Other Active Problems  Hypoxic brain injury  Hospital day # 6    Patient's neurological exam is quite poor  .  Prognosis for survival and making meaningful improvement in having functional life seem quite slim.  Recommend wean off sedation as tolerated    Discussed with Dr. Karie Fetch critical care MD and answered questions.   I had a long discussion with the patient's wife at the bedside as well as spoke to his daughter Rosey Bath over the phone about his extremely poor prognosis and negligible chances of survival in making any meaningful improvement.  Patient's condition appears to be declining in the last few days requiring increasing vasopressor support and even with maximum life support is likely going to not survive beyond few days.  Patient's wife agrees to DNR but needs more time to discuss with other family members before withdrawing life support. .This patient is critically ill and at significant risk of neurological worsening, death and care requires constant monitoring of vital signs, hemodynamics,respiratory and cardiac monitoring, extensive review of multiple databases, frequent neurological assessment, discussion with family, other specialists and medical decision making of high complexity.I have made any additions or clarifications directly to the above note.This critical care time does not reflect procedure time, or teaching time or supervisory time of PA/NP/Med Resident etc but could involve care discussion time.  I spent 40 minutes of neurocritical care time  in the care of  this patient.     Delia Heady, MD Medical Director [facility] Pager: [phone] 01/08/2020 2:08 PM   To contact Stroke Continuity provider, please refer to [email or web address]. After hours, contact General Neurology

## 2020-01-09 ENCOUNTER — Inpatient Hospital Stay (HOSPITAL_COMMUNITY): Payer: Commercial Managed Care - PPO

## 2020-01-09 DIAGNOSIS — I63431 Cerebral infarction due to embolism of right posterior cerebral artery: Secondary | ICD-10-CM

## 2020-01-09 DIAGNOSIS — Z7901 Long term (current) use of anticoagulants: Secondary | ICD-10-CM

## 2020-01-09 DIAGNOSIS — K7200 Acute and subacute hepatic failure without coma: Secondary | ICD-10-CM

## 2020-01-09 DIAGNOSIS — I214 Non-ST elevation (NSTEMI) myocardial infarction: Secondary | ICD-10-CM

## 2020-01-09 LAB — COMPREHENSIVE METABOLIC PANEL
ALT: 470 U/L — ABNORMAL HIGH (ref 0–44)
AST: 1187 U/L — ABNORMAL HIGH (ref 15–41)
Albumin: 1.7 g/dL — ABNORMAL LOW (ref 3.5–5.0)
Alkaline Phosphatase: 229 U/L — ABNORMAL HIGH (ref 38–126)
Anion gap: 23 — ABNORMAL HIGH (ref 5–15)
BUN: 145 mg/dL — ABNORMAL HIGH (ref 8–23)
CO2: 18 mmol/L — ABNORMAL LOW (ref 22–32)
Calcium: 6.8 mg/dL — ABNORMAL LOW (ref 8.9–10.3)
Chloride: 99 mmol/L (ref 98–111)
Creatinine, Ser: 8.82 mg/dL — ABNORMAL HIGH (ref 0.61–1.24)
GFR calc Af Amer: 7 mL/min — ABNORMAL LOW (ref >60)
GFR calc non Af Amer: 6 mL/min — ABNORMAL LOW (ref >60)
Glucose, Bld: 214 mg/dL — ABNORMAL HIGH (ref 70–99)
Potassium: 5.1 mmol/L (ref 3.5–5.1)
Sodium: 140 mmol/L (ref 135–145)
Total Bilirubin: 5.1 mg/dL — ABNORMAL HIGH (ref 0.3–1.2)
Total Protein: 5.6 g/dL — ABNORMAL LOW (ref 6.5–8.1)

## 2020-01-09 LAB — CBC
HCT: 29.5 % — ABNORMAL LOW (ref 39.0–52.0)
Hemoglobin: 9.7 g/dL — ABNORMAL LOW (ref 13.0–17.0)
MCH: 27 pg (ref 26.0–34.0)
MCHC: 32.9 g/dL (ref 30.0–36.0)
MCV: 82.2 fL (ref 80.0–100.0)
Platelets: 175 10*3/uL (ref 150–400)
RBC: 3.59 MIL/uL — ABNORMAL LOW (ref 4.22–5.81)
RDW: 17.8 % — ABNORMAL HIGH (ref 11.5–15.5)
WBC: 17 10*3/uL — ABNORMAL HIGH (ref 4.0–10.5)
nRBC: 1.1 % — ABNORMAL HIGH (ref 0.0–0.2)

## 2020-01-09 LAB — MAGNESIUM: Magnesium: 3 mg/dL — ABNORMAL HIGH (ref 1.7–2.4)

## 2020-01-09 LAB — GLUCOSE, CAPILLARY
Glucose-Capillary: 190 mg/dL — ABNORMAL HIGH (ref 70–99)
Glucose-Capillary: 176 mg/dL — ABNORMAL HIGH (ref 70–99)
Glucose-Capillary: 200 mg/dL — ABNORMAL HIGH (ref 70–99)
Glucose-Capillary: 144 mg/dL — ABNORMAL HIGH (ref 70–99)

## 2020-01-09 MED ORDER — MIDAZOLAM HCL 2 MG/2ML IJ SOLN
2.0000 mg | INTRAMUSCULAR | Status: DC | PRN
  Administered 2020-01-09: 2 mg via INTRAVENOUS
  Filled 2020-01-09: qty 4

## 2020-01-09 MED ORDER — GLYCOPYRROLATE 1 MG PO TABS
1.0000 mg | ORAL_TABLET | ORAL | Status: DC | PRN
  Filled 2020-01-09: qty 1

## 2020-01-09 MED ORDER — POLYVINYL ALCOHOL 1.4 % OP SOLN
1.0000 [drp] | Freq: Four times a day (QID) | OPHTHALMIC | Status: DC | PRN
  Filled 2020-01-09 (×2): qty 15

## 2020-01-09 MED ORDER — ACETAMINOPHEN 325 MG PO TABS: 650.0000 mg | ORAL_TABLET | Freq: Four times a day (QID) | ORAL | Status: DC | PRN

## 2020-01-09 MED ORDER — PHENYLEPHRINE HCL-NACL 10-0.9 MG/250ML-% IV SOLN
INTRAVENOUS | Status: AC
  Filled 2020-01-09: qty 250

## 2020-01-09 MED ORDER — ACETAMINOPHEN 650 MG RE SUPP: 650.0000 mg | Freq: Four times a day (QID) | RECTAL | Status: DC | PRN

## 2020-01-09 MED ORDER — DEXTROSE 5 % IV SOLN: INTRAVENOUS | Status: DC

## 2020-01-09 MED ORDER — DIPHENHYDRAMINE HCL 50 MG/ML IJ SOLN
25.0000 mg | INTRAMUSCULAR | Status: DC | PRN
  Administered 2020-01-09: 25 mg via INTRAVENOUS
  Filled 2020-01-09: qty 1

## 2020-01-09 MED ORDER — GLYCOPYRROLATE 0.2 MG/ML IJ SOLN
0.2000 mg | INTRAMUSCULAR | Status: DC | PRN
  Administered 2020-01-09: 0.2 mg via INTRAVENOUS
  Filled 2020-01-09: qty 1

## 2020-01-09 MED ORDER — GLYCOPYRROLATE 0.2 MG/ML IJ SOLN: 0.2000 mg | INTRAMUSCULAR | Status: DC | PRN

## 2020-01-10 LAB — CALCIUM, IONIZED
Calcium, Ionized, Serum: 3.7 mg/dL — ABNORMAL LOW (ref 4.5–5.6)
Calcium, Ionized, Serum: 3.6 mg/dL — ABNORMAL LOW (ref 4.5–5.6)

## 2020-01-14 ENCOUNTER — Other Ambulatory Visit: Payer: Self-pay

## 2020-01-21 ENCOUNTER — Other Ambulatory Visit (HOSPITAL_COMMUNITY): Payer: Self-pay | Admitting: Cardiology

## 2020-01-21 ENCOUNTER — Other Ambulatory Visit: Payer: Self-pay | Admitting: Internal Medicine

## 2020-02-05 NOTE — Progress Notes (Signed)
STROKE TEAM PROGRESS NOTE   INTERVAL HISTORY Patient RN at bedside. Patient remains intubated for respiratory failure. Worsening multiorgan failure. Pt unresponsive, intermittent gasp for air although with ventilation. Sluggish pupillary reflexes and corneal. No gag or cough. Per RN, wife called this am and will be here around noon for terminal extubation.    Vitals:   01/21/2020 1100 01/19/2020 1200 01/22/2020 1300 01/26/2020 1400  BP: 110/64 (!) 104/57 111/62 117/65  Pulse: (!) 102 (!) 48 82 92  Resp: (!) 38 (!) 39 (!) 38 (!) 36  Temp: 99 F (37.2 C) 99.1 F (37.3 C) 99.5 F (37.5 C) 99.7 F (37.6 C)  TempSrc:      SpO2: 100% 98% 99% 97%  Weight:      Height:       CBC:  Recent Labs  Lab 01/08/20 0409 01/08/20 0409 01/08/20 2020 01/31/2020 0424  WBC 12.3*  --   --  17.0*  HGB 10.5*   < > 10.2* 9.7*  HCT 33.8*   < > 30.0* 29.5*  MCV 85.8  --   --  82.2  PLT 138*  --   --  175   < > = values in this interval not displayed.   Basic Metabolic Panel:  Recent Labs  Lab 01/06/20 0315 01/06/20 1150 01/07/20 0453 01/07/20 1924 01/08/20 0409 01/08/20 0409 01/08/20 2020 01/21/2020 0424  NA 137   < > 138   < > 138   < > 139 140  K 3.6   < > 4.5   < > 5.3*   < > 5.0 5.1  CL 102  --  101  --  100  --   --  99  CO2 23  --  21*  --  19*  --   --  18*  GLUCOSE 232*  --  196*  --  180*  --   --  214*  BUN 68*  --  95*  --  126*  --   --  145*  CREATININE 2.57*  --  4.92*  --  7.12*  --   --  8.82*  CALCIUM 7.9*  --  8.3*  --  7.5*  --   --  6.8*  MG 2.6*  --  2.7*  --   --   --   --  3.0*  PHOS 3.2  --  5.0*  --   --   --   --   --    < > = values in this interval not displayed.   Lipid Panel:  Recent Labs  Lab 01/08/20 0409  TRIG 294*   HgbA1c:  No results for input(s): HGBA1C in the last 168 hours. Urine Drug Screen:  No results for input(s): LABOPIA, COCAINSCRNUR, LABBENZ, AMPHETMU, THCU, LABBARB in the last 168 hours.  Alcohol Level No results for input(s): ETH in the last  168 hours.  IMAGING past 24 hours DG CHEST PORT 1 VIEW  Result Date: 01/21/2020 CLINICAL DATA:  Respiratory acidosis EXAM: PORTABLE CHEST 1 VIEW COMPARISON:  Two days ago FINDINGS: The endotracheal tube tip projects at the clavicular heads. The enteric tube reaches the stomach. Left IJ line with tip at the SVC origin. Stable heart size which is generous. Extensive airspace disease that is not progressed. No visible effusion or pneumothorax. IMPRESSION: Stable hardware positioning and extensive airspace disease. Electronically Signed   By: Marnee Spring M.D.   On: 01/14/2020 08:41    PHYSICAL EXAM :  Temp:  [98.2  F (36.8 C)-100.9 F (38.3 C)] 99.7 F (37.6 C) (09/04 1400) Pulse Rate:  [48-126] 92 (09/04 1400) Resp:  [0-53] 36 (09/04 1400) BP: (89-117)/(50-77) 117/65 (09/04 1400) SpO2:  [97 %-100 %] 97 % (09/04 1400) Arterial Line BP: (78-117)/(47-63) 115/63 (09/04 1400) FiO2 (%):  [90 %] 90 % (09/04 0801) Weight:  [130 kg] 130 kg (09/04 0431)  General - Well nourished, well developed, intubated without sedation.  Ophthalmologic - fundi not visualized due to noncooperation.  Cardiovascular - irregularly irregular heart rate and rhythm.  Neuro - intubated not on sedation, eyes closed, not following commands. With forced eye opening, eyes in mid position, not blinking to visual threat, doll's eyes absent, not tracking, pupil equal size 2.71mm, sluggish to light reaction. Corneal reflex absent on the left but present on the right, gag and cough absent. Breathing over the vent intermittently.  Facial symmetry not able to test due to ET tube.  Tongue protrusion not cooperative. On pain stimulation, no movement in all extremities. DTR diminished and no babinski. Sensation, coordination and gait not tested.   ASSESSMENT/PLAN Mr. DELDRICK LINCH is a 64 y.o. male with past for history of atrial fibrillation/atrial flutter status post ablation on Eliquis, dilated aortic root cardiomyopathy,  hypertension, obstructive sleep apnea who has been at his usual state of health until his wife noted him breathing heavier and he was more tired than usual.  On the evening of January 17, 2020 patient collapsed in front of her.  She called 911 started chest compressions.  EMS found patient to be in V. fib.  It was estimated that 1 hour of ACLS was obtained prior to ROSC.  Patient's target was normothermia at that time.  During work-up to evaluate for hypoxic injury it was noted that patient had a right PCA infarct.  Thus neurology was consulted.  Stroke:  right PCA infarct, embolic secondary to V. fib cardiac arrest vs. atrial fibrillation   CT Code Stroke - No acute intracranial pathology. Old right occipital infarct.   MRI - Large right PCA territory acute infarct. No hemorrhage or mass effect. Absent right PCA flow void consistent with occlusion. Small foci of acute ischemia in the left cerebellum and left occipital lobe.  2D Echo - EF 35% - atrial fibrillation  LDL 77  HgbA1c 7.5  VTE prophylaxis - Heparin and SCD's  Eliquis (apixaban) daily prior to admission, was on heparin IV held on 01/06/20 per primary team. Now transition to comfort care  Multiorgan failure  Respiratory failure with ARDS- worsening - on vent  Multifocal pneumonia  Chronic A. Fib  Cardiomyopathy  V fib cardiac arrest  Non-STEMI  Anoxic brain injury  Acute renal failure - auric and hyperkalemia  Shock liver  Fever, leukocytosis  Hyperglycemia  Poor prognosis  Other Stroke Risk Factors  Obesity, Body mass index is 39.97 kg/m., recommend weight loss, diet and exercise as appropriate   Family hx stroke (mother)  Obstructive sleep apnea   Other Active Problems  Anemia - Hgb - 9.7  Code Status - DNR   Hospital day # 7  Patient's current condition is extremely poor. Agree with CCM and family regarding comfort care measures. Family plan to come soon for terminal extubation. Neurology will sign  off. Please call with questions. Thanks for the consult.  Marvel Plan, MD PhD Stroke Neurology 01/07/2020 4:38 PM         To contact Stroke Continuity provider, please refer to [email or web address]. After hours, contact General Neurology

## 2020-02-05 NOTE — Progress Notes (Signed)
Progress Note  Patient Name: Colin [name] Date of Encounter: 01-21-20  [facility] Cardiologist: Fransico Him, MD   Subjective   64 year old gentleman who was admitted to the hospital after having a ventricular fibrillation cardiac arrest.  The patient has a history of paroxysmal atrial fibrillation and is on apixaban.  He has a history of a dilated cardiomyopathy with an ejection fraction of around 40%.  He has a dilated aortic root, type 2 diabetes mellitus, obstructive sleep apnea, morbid obesity.  He was brought to the hospital by EMS after a witnessed cardiac arrest.  CPR was started at home by his spouse.  EMS determined the rhythm to be VF.  He was shocked 11 times.  He was given multiple rounds of epinephrine and magnesium and amiodarone.  Estimated time of CPR was about 1 hour.  He was intubated in the field.  Echocardiogram on August 31 reveals an EF of around 35%.  He has global LV dysfunction.  The right RV is mildly enlarged.  His PA pressures are estimated at 45 mmHg.  Troponins are greater than 27,000 consistent with a non-ST segment elevation myocardial infarction.  The plan is to proceed with ischemic evaluation if he has significant neurologic recovery.  He has anoxic encephalopathy. Prognosis is poor.  Inpatient Medications    Scheduled Meds: . acetaminophen  650 mg Oral Q4H   Or  . acetaminophen (TYLENOL) oral liquid 160 mg/5 mL  650 mg Per Tube Q4H   Or  . acetaminophen  650 mg Rectal Q4H  . aspirin  324 mg Per Tube Daily  . chlorhexidine gluconate (MEDLINE KIT)  15 mL Mouth Rinse BID  . Chlorhexidine Gluconate Cloth  6 each Topical Daily  . docusate  100 mg Per Tube BID  . feeding supplement (PROSource TF)  90 mL Per Tube TID  . feeding supplement (VITAL HIGH PROTEIN)  1,000 mL Per Tube Q24H  . heparin injection (subcutaneous)  5,000 Units Subcutaneous Q8H  . insulin aspart  0-15 Units Subcutaneous Q4H  . insulin aspart  4 Units Subcutaneous Q4H  .  insulin glargine  10 Units Subcutaneous BID  . levothyroxine  200 mcg Per Tube Once per day on Mon Tue Wed Thu Fri Sat   And  . [START ON 01/10/2020] levothyroxine  100 mcg Per Tube Once per day on Sun  . mouth rinse  15 mL Mouth Rinse 10 times per day  . pantoprazole (PROTONIX) IV  40 mg Intravenous Daily  . polyethylene glycol  17 g Per Tube BID  . sennosides  5 mL Per Tube BID  . sodium chloride flush  10-40 mL Intracatheter Q12H  . vancomycin variable dose per unstable renal function (pharmacist dosing)   Does not apply See admin instructions   Continuous Infusions: . sodium chloride Stopped (01-21-20 0019)  . amiodarone 30 mg/hr (Jan 21, 2020 0059)  . ceFEPime (MAXIPIME) IV 2 g (01/08/20 1608)  . fentaNYL infusion INTRAVENOUS 100 mcg/hr (01-21-20 0103)  . norepinephrine (LEVOPHED) Adult infusion 22 mcg/min (01-21-20 0700)  . phenylephrine (NEO-SYNEPHRINE) Adult infusion 50 mcg/min (2020/01/21 0700)  .  sodium bicarbonate (isotonic) infusion in sterile water 50 mL/hr at 01-21-2020 0104   PRN Meds: labetalol, sodium chloride flush   Vital Signs    Vitals:   21-Jan-2020 0500 01-21-2020 0600 01-21-20 0700 2020/01/21 0801  BP: (!) 107/59 (!) 108/50 114/77   Pulse: (!) 104 (!) 102 83   Resp: (!) 30 (!) 31 (!) 22   Temp: 98.2  F (36.8 C) 98.4 F (36.9 C) 98.4 F (36.9 C)   TempSrc:      SpO2: 100% 100% 100% 100%  Weight:      Height:        Intake/Output Summary (Last 24 hours) at 29-Jan-2020 0820 Last data filed at January 29, 2020 0700 Gross per 24 hour  Intake 2643.18 ml  Output 175 ml  Net 2468.18 ml   Last 3 Weights 01/29/20 01/07/2020 01/06/2020  Weight (lbs) 286 lb 9.6 oz 270 lb 15.1 oz 259 lb 14.8 oz  Weight (kg) 130 kg 122.9 kg 117.9 kg      Telemetry    afib with rvr - Personally Reviewed  ECG     - Personally Reviewed  Physical Exam   GEN:  Morbidly obese middle-aged gentleman.  He is intubated and on the ventilator.  He is unresponsive. Neck: No JVD Cardiac:  Irregularly  irregular.  Tachycardic. Respiratory:  Currently on the ventilator. GI: Soft, nontender, non-distended  MS: No edema; No deformity. Neuro:  Unresponsive. Psych: Unresponsive  Labs    High Sensitivity Troponin:   Recent Labs  Lab 12/28/2019 2333 01/02/20 0440  TROPONINIHS 1,600* >27,000*      Chemistry Recent Labs  Lab 01/04/20 0402 01/05/20 0236 01/07/20 0453 01/07/20 1924 01/08/20 0409 01/08/20 2020 2020-01-29 0424  NA 140   < > 138   < > 138 139 140  K 4.2   < > 4.5   < > 5.3* 5.0 5.1  CL 102   < > 101  --  100  --  99  CO2 26   < > 21*  --  19*  --  18*  GLUCOSE 169*   < > 196*  --  180*  --  214*  BUN 34*   < > 95*  --  126*  --  145*  CREATININE 2.32*   < > 4.92*  --  7.12*  --  8.82*  CALCIUM 7.8*   < > 8.3*  --  7.5*  --  6.8*  PROT 5.6*  --   --   --  5.9*  --  5.6*  ALBUMIN 2.7*  --   --   --  1.9*  --  1.7*  AST 213*  --   --   --  400*  --  1,187*  ALT 105*  --   --   --  184*  --  470*  ALKPHOS 74  --   --   --  182*  --  229*  BILITOT 1.9*  --   --   --  4.8*  --  5.1*  GFRNONAA 29*   < > 12*  --  7*  --  6*  GFRAA 33*   < > 13*  --  9*  --  7*  ANIONGAP 12   < > 16*  --  19*  --  23*   < > = values in this interval not displayed.     Hematology Recent Labs  Lab 01/07/20 0453 01/07/20 1924 01/08/20 0409 01/08/20 2020 01-29-20 0424  WBC 5.4  --  12.3*  --  17.0*  RBC 4.07*  --  3.94*  --  3.59*  HGB 11.0*   < > 10.5* 10.2* 9.7*  HCT 35.3*   < > 33.8* 30.0* 29.5*  MCV 86.7  --  85.8  --  82.2  MCH 27.0  --  26.6  --  27.0  MCHC 31.2  --  31.1  --  32.9  RDW 17.5*  --  18.1*  --  17.8*  PLT 92*  --  138*  --  175   < > = values in this interval not displayed.    BNPNo results for input(s): BNP, PROBNP in the last 168 hours.   DDimer No results for input(s): DDIMER in the last 168 hours.   Radiology    DG Abd 1 View  Result Date: 01/07/2020 CLINICAL DATA:  Feeding tube placement EXAM: ABDOMEN - 1 VIEW COMPARISON:  None. FINDINGS:  Orogastric tube is in place with its tip overlying the expected distal body of the stomach. Normal abdominal gas pattern. No gross free intraperitoneal gas. IMPRESSION: Orogastric tube tip overlies the expected distal body of the stomach. Electronically Signed   By: Fidela Salisbury MD   On: 01/07/2020 22:38   DG CHEST PORT 1 VIEW  Result Date: 01/07/2020 CLINICAL DATA:  Cardiac arrest, central line placement EXAM: PORTABLE CHEST 1 VIEW COMPARISON:  5:14 a.m. FINDINGS: The lungs are symmetrically well expanded. Extensive bilateral airspace infiltrates are again identified in keeping with multifocal pneumonia, ARDS, or, less likely, asymmetric alveolar pulmonary edema. Endotracheal tube is seen at the level of the clavicular heads 8.8 cm above the carina. Nasogastric tube extends into the upper abdomen beyond the margin of the examination. Left internal jugular central venous catheter is unchanged with its tip oriented horizontally within the proximal superior vena cava, likely at the junction of the brachiocephalic vein and superior vena cava. No pneumothorax or pleural effusion. Mild cardiomegaly is stable. No acute bone abnormality. IMPRESSION: 1. Persistent extensive bilateral airspace infiltrates in keeping with multifocal pneumonia, ARDS, or, less likely, asymmetric alveolar pulmonary edema. 2. Stable support apparatus. Left internal jugular central venous catheter tip unchanged, oriented horizontally within the proximal SVC. 3. No pneumothorax or pleural effusion. Electronically Signed   By: Fidela Salisbury MD   On: 01/07/2020 22:37    Cardiac Studies     Patient Profile     64 y.o. male   Assessment & Plan    1.  VF cardiac arrest: The patient had a VF arrest associated with a non-ST segment elevation myocardial infarction.  He had a very prolonged downtime with ROSC achieved approximately 1 hour after CPR started.  He has evidence of anoxic brain injury.  He is completely unresponsive.  We  plan doing further ischemic work-up if he makes significant neurologic recovery but at this point his prognosis is very guarded. He has multiorgan failure.  Creatinine is 8.8.  He has shock liver.  Continue supportive care for now. Notes from yesterday suggest that the family will be making a decision today if/when they will terminally extubate.   [facility] will sign off.   Medication Recommendations:   Other recommendations (labs, testing, etc):   Follow up as an outpatient:    For questions or updates, please contact [place] Please consult [email or web address] for contact info under        Signed, Mertie Moores, MD  01-18-2020, 8:20 AM

## 2020-02-05 NOTE — Procedures (Signed)
Patient terminally extubated per MD's order at 1454

## 2020-02-05 NOTE — Procedures (Signed)
Extubation Procedure Note  Patient Details:   Name: Colin [name] DOB: [birth year] MRN: [id number]   Airway Documentation:  Airway 8 mm (Active)  Secured at (cm) 25 cm 01/16/2020 0801  Measured From Lips 01/10/2020 0801  Secured Location Left 01/08/2020 0801  Secured By [employer] 01/07/2020 0801  Tube Holder Repositioned Yes 01/19/2020 0801  Cuff Pressure (cm H2O) 28 cm H2O 01/15/2020 0801  Site Condition Dry 02/02/2020 0801   Vent end date: (not recorded) Vent end time: (not recorded)   Evaluation  O2 sats: stable throughout Complications: No apparent complications Patient did tolerate procedure well. Bilateral Breath Sounds: Clear, Diminished   Yes  Georgeanna Lea 01/24/2020, 2:57 PM

## 2020-02-05 NOTE — Progress Notes (Signed)
Assisted wth video visit with family members

## 2020-02-05 NOTE — Progress Notes (Signed)
NAME:  Colin [name], MRN:  [id number], DOB:  06/23/1955, LOS: 7 ADMISSION DATE:  2020/01/28, CONSULTATION DATE:  01/24/2020 REFERRING MD:  EDP, CHIEF COMPLAINT:  Cardiac arrest   Brief History   63 year old male past medical history of A. fib/a flutter on Eliquis, cardiomyopathy, hypertension, obesity, OSA who had a witnessed out of hospital V. fib arrest with 1 hour of CPR before ROSC obtained.  Intubated and PCCM consulted for admission  History of present illness   Mr. Colin [name] is a 64 year old male with past medical history of A. fib/a flutter s/p ablation on Eliquis, dilated aortic root cardiomyopathy, hypertension, obesity, OSA who has been in his usual state of health recently, though his wife noted he seemed to be breathing heavier and was more tired than usual for about the last day.  She says he never complains, just went to bed early.  This evening she was talking to him when he collapsed in front of her.  She called 911 and started chest compressions.  EMS found patient in V-fib, he was intubated and defibrillated multiple times, given amiodarone and a total of 7 mg of epi was obtained, estimated 1 hour of ACLS.  On arrival to the ED, patient was unresponsive, head CT without acute findings.  EKG with new anterior ST depressions and troponin 1,600.  Cardiology was consulted and confirmed no STEMI. blood work significant for lactic acidosis and leukocytosis with AKI and elevated LFTs.  Covid-19 negative no other source of infection noted.  PCCM consulted for admission  Past Medical History   has a past medical history of Atrial flutter (HCC), DCM (dilated cardiomyopathy) (HCC), Dilated aortic root (HCC) (04/15/2015), Hypertension, Obesities, morbid (HCC), OSA (obstructive sleep apnea), and Persistent atrial fibrillation (HCC).   Significant Hospital Events   8/28 Admit to PCCM  Consults:  Cardiology, neuro.  Procedures:  8/27 ETT  Significant Diagnostic Tests:  8/27 CXR>>Central  vascular congestion consistent with the recent CPR. 8/28 CT head>>Mild age-related atrophy and chronic microvascular ischemic changes. Old right occipital infarct. 8/29 EEG > profound diffuse encephalopathy without seizure or epileptiform discharges. 8/31 Echo > EF 35%, mildly reduced RV function. 8/31 Brain MRI > large rigtht PCA territory infarct.  Absent right PCA flow c/w occlusion, small acute ischemia in left cerebellum and left occipital lobes.  Micro Data:  8/28 Sars-Cov-2>>negative  Antimicrobials:  Unasyn 8/28 >>   Interim history/subjective:  Patient remains unresponsive to painful stimuli.  Worsening renal failure and increasing vasopressor requirements   Objective   Blood pressure 117/65, pulse 92, temperature 99.7 F (37.6 C), resp. rate (!) 36, height 5\' 11"  (1.803 m), weight (S) 130 kg, SpO2 97 %. CVP:  [12 mmHg-28 mmHg] 28 mmHg  Vent Mode: PRVC FiO2 (%):  [90 %] 90 % Set Rate:  [35 bmp] 35 bmp Vt Set:  [530 mL] 530 mL PEEP:  [16 cmH20] 16 cmH20 Plateau Pressure:  [37 cmH20-47 cmH20] 38 cmH20   Intake/Output Summary (Last 24 hours) at 02/04/2020 1430 Last data filed at 01/08/2020 1300 Gross per 24 hour  Intake 2057.84 ml  Output 175 ml  Net 1882.84 ml   Filed Weights   01/06/20 0100 01/07/20 0600 01/23/2020 0431  Weight: 117.9 kg 122.9 kg (S) 130 kg   Physical Exam General: Critically ill Middle-age male lying in bed HEENT: ETT, MM pink/moist, PERRL, pupils pinpoint Neuro: Sedated on ventilator, Unresponsive to deep sternal rub and deep trap pinching CV: s1s2 , PAC's per tele, brady and irregular, no murmur,  rubs, or gallops,  PULM: Bilateral chest excursion, Coarse breath sounds throughout, no added breath sounds, thick secretions , mild vent dyssynchrony, Oxygen sats 89-90% GI: Soft, , bowel sounds active in all 4 quadrants, non-tender, non-distended,  Extremities: cold to touch,  Trace edema edema, no obvious deformities  Skin: no rashes or  lesions   Assessment & Plan:   Acute respiratory failure with hypoxemia, ventilator dependence ARDS physiology, suspect aspiration pneumonia>> Multifocal pneumonia Witnessed, out of hospital V. fib cardiac arrest  NSTEMI  History hypertension Chronic atrial fibrillation History of dilated cardiomyopathy   Acute kidney injury ATN due to arrest, nonoliguric. Hyperkalemia Elevated LFTs, likely shock liver  Hyperglycemia Hypothyroidism. Right PCA infarct with absent right PCA flow, left cerebellar and occipital lobe infarcts. Fever  And Leukocytosis  Plan:  Worsening multisystem organ failure in patient whose prognosis for neurologic recovery following prolonged cardiac arrest is dismal. Transition to comfort care and compassionate extubation per discussion with the family.    Best practice:  Diet: TF's Pain/Anxiety/Delirium protocol (if indicated): fentanyl gtt / propofol gtt/ VAP protocol (if indicated): HOB 30 degrees, suction as needed DVT prophylaxis: Heparin GI prophylaxis: Protonix Glucose control: SSI Mobility: Bedrest Code Status: Full code Family Communication: Will continue to update wife at bedside once she arrives.  Family hoping for a miracle. Disposition: ICU   Critical care time:    Performed by: Lynnell Catalan  Total critical care time: 32 minutes  Critical care time was exclusive of separately billable procedures and treating other patients.  Critical care was necessary to treat or prevent imminent or life-threatening deterioration.  Critical care was time spent personally by me on the following activities: development of treatment plan with patient and/or surrogate as well as nursing, discussions with consultants, evaluation of patient's response to treatment, examination of patient, obtaining history from patient or surrogate, ordering and performing treatments and interventions, ordering and review of laboratory studies, ordering and review of  radiographic studies, pulse oximetry and re-evaluation of patient's condition.  CRITICAL CARE Performed by: Lynnell Catalan   Total critical care time: 35 minutes  Critical care time was exclusive of separately billable procedures and treating other patients.  Critical care was necessary to treat or prevent imminent or life-threatening deterioration.  Critical care was time spent personally by me on the following activities: development of treatment plan with patient and/or surrogate as well as nursing, discussions with consultants, evaluation of patient's response to treatment, examination of patient, obtaining history from patient or surrogate, ordering and performing treatments and interventions, ordering and review of laboratory studies, ordering and review of radiographic studies, pulse oximetry, re-evaluation of patient's condition and participation in multidisciplinary rounds.  Lynnell Catalan, MD [facility] ICU Physician [facility] [place] [facility]  Pager: [phone] Mobile: [phone] After hours: [phone].   2020-01-23, 2:30 PM

## 2020-02-05 NOTE — Death Summary Note (Signed)
DEATH SUMMARY   Patient Details  Name: Colin [name] MRN: [id number] DOB: 02/26/1956  Admission/Discharge Information   Admit Date:  04-03-20  Date of Death: Date of Death: 01/06/2020  Time of Death: Time of Death: 1459  Length of Stay: 7  Referring Physician: Dorothyann PengSanders, Robyn, MD   Reason(s) for Hospitalization  Cardiac arrest  Diagnoses  Preliminary cause of death: Anoxic encephalopathy, multisystem organ failure Secondary Diagnoses (including complications and co-morbidities):  Principal Problem:   Cardiac arrest with ventricular fibrillation (HCC) Active Problems:   Acute respiratory failure with hypoxia (HCC)   Hypoxia   Cerebral embolism with cerebral infarction   Brief Hospital Course (including significant findings, care, treatment, and services provided and events leading to death)  Consepcion HearingBruce E [name] is a 64 y.o. year old male who has a history of known cardiomyopathy.  Witnessed collapse.  Found to be in ventricular fibrillation.  Defibrillated multiple times with an estimated resuscitation, approximately 1 hour.  Worsening multisystem organ failure.  Seen by neurology who concurred that prognosis was poor.  Patient transitioned to comfort care and compassionately extubated per family's request.    Pertinent Labs and Studies  Significant Diagnostic Studies DG Abd 1 View  Result Date: 01/07/2020 CLINICAL DATA:  Feeding tube placement EXAM: ABDOMEN - 1 VIEW COMPARISON:  None. FINDINGS: Orogastric tube is in place with its tip overlying the expected distal body of the stomach. Normal abdominal gas pattern. No gross free intraperitoneal gas. IMPRESSION: Orogastric tube tip overlies the expected distal body of the stomach. Electronically Signed   By: Helyn NumbersAshesh  Parikh MD   On: 01/07/2020 22:38   CT Head Wo Contrast  Result Date: 01/02/2020 CLINICAL DATA:  64 year old male with syncope. EXAM: CT HEAD WITHOUT CONTRAST TECHNIQUE: Contiguous axial images were obtained from the  base of the skull through the vertex without intravenous contrast. COMPARISON:  None. FINDINGS: Brain: Mild age-related atrophy and chronic microvascular ischemic changes. Old right occipital infarct. The false appears slightly prominent, possibly related to angulation. A trace parafalcine subdural hemorrhage is less likely. No other acute hemorrhage. No mass effect or midline shift. Vascular: High attenuation of the intracranial vasculature, likely hemoconcentration/dehydration. Skull: Normal. Negative for fracture or focal lesion. Sinuses/Orbits: Partial opacification of the sphenoid sinuses with air-fluid level. The mastoid air cells are clear. Other: An endotracheal and an enteric tube are partially visualized. IMPRESSION: 1. No acute intracranial pathology. 2. Mild age-related atrophy and chronic microvascular ischemic changes. Old right occipital infarct. Electronically Signed   By: Elgie CollardArash  Radparvar M.D.   On: 01/02/2020 00:51   MR BRAIN W WO CONTRAST  Result Date: 01/06/2020 CLINICAL DATA:  Cardiac arrest. EXAM: MRI HEAD WITHOUT AND WITH CONTRAST TECHNIQUE: Multiplanar, multiecho pulse sequences of the brain and surrounding structures were obtained without and with intravenous contrast. CONTRAST:  10mL GADAVIST GADOBUTROL 1 MMOL/ML IV SOLN COMPARISON:  07/13/2016 brain MRI FINDINGS: Brain: There is a large right PCA territory acute infarct. There are small foci of acute ischemia in the left cerebellum and left occipital lobe. Multifocal hyperintense T2-weighted signal within the white matter. White matter T2 hyperintensity. No midline shift or other mass effect. No chronic microhemorrhage. Normal midline structures. There is no abnormal contrast enhancement. Vascular: Absent right PCA flow void. Skull and upper cervical spine: Normal marrow signal. Sinuses/Orbits: Fluid filling the nasopharynx. Bilateral maxillary sinus retention cysts. Small amount of bilateral mastoid fluid. Normal orbits. Other: None  IMPRESSION: 1. Large right PCA territory acute infarct. No hemorrhage or mass effect. 2. Absent  right PCA flow void consistent with occlusion. 3. Small foci of acute ischemia in the left cerebellum and left occipital lobe. Electronically Signed   By: Deatra Robinson M.D.   On: 01/06/2020 00:19   DG CHEST PORT 1 VIEW  Result Date: 01/11/2020 CLINICAL DATA:  Respiratory acidosis EXAM: PORTABLE CHEST 1 VIEW COMPARISON:  Two days ago FINDINGS: The endotracheal tube tip projects at the clavicular heads. The enteric tube reaches the stomach. Left IJ line with tip at the SVC origin. Stable heart size which is generous. Extensive airspace disease that is not progressed. No visible effusion or pneumothorax. IMPRESSION: Stable hardware positioning and extensive airspace disease. Electronically Signed   By: Marnee Spring M.D.   On: 01/22/2020 08:41   DG CHEST PORT 1 VIEW  Result Date: 01/07/2020 CLINICAL DATA:  Cardiac arrest, central line placement EXAM: PORTABLE CHEST 1 VIEW COMPARISON:  5:14 a.m. FINDINGS: The lungs are symmetrically well expanded. Extensive bilateral airspace infiltrates are again identified in keeping with multifocal pneumonia, ARDS, or, less likely, asymmetric alveolar pulmonary edema. Endotracheal tube is seen at the level of the clavicular heads 8.8 cm above the carina. Nasogastric tube extends into the upper abdomen beyond the margin of the examination. Left internal jugular central venous catheter is unchanged with its tip oriented horizontally within the proximal superior vena cava, likely at the junction of the brachiocephalic vein and superior vena cava. No pneumothorax or pleural effusion. Mild cardiomegaly is stable. No acute bone abnormality. IMPRESSION: 1. Persistent extensive bilateral airspace infiltrates in keeping with multifocal pneumonia, ARDS, or, less likely, asymmetric alveolar pulmonary edema. 2. Stable support apparatus. Left internal jugular central venous catheter tip  unchanged, oriented horizontally within the proximal SVC. 3. No pneumothorax or pleural effusion. Electronically Signed   By: Helyn Numbers MD   On: 01/07/2020 22:37   DG Chest Port 1 View  Result Date: 01/06/2020 CLINICAL DATA:  Respiratory failure with ventilator dependence. EXAM: PORTABLE CHEST 1 VIEW COMPARISON:  Multiple recent previous exams. FINDINGS: 0514 hours. Endotracheal tube tip is 4.7 cm above the base of the carina. Diffuse bilateral airspace disease is similar to prior. The cardio pericardial silhouette is enlarged. The NG tube passes into the stomach although the distal tip position is not included on the film. Left IJ central line tip overlies the innominate vein confluence. Telemetry leads overlie the chest. IMPRESSION: No substantial interval change in exam. Diffuse bilateral airspace disease is similar to prior. Electronically Signed   By: Kennith Center M.D.   On: 01/06/2020 08:42   DG Chest Port 1 View  Result Date: 01/05/2020 CLINICAL DATA:  Intubation.  Respiratory failure. EXAM: PORTABLE CHEST 1 VIEW COMPARISON:  01/04/2020. FINDINGS: Endotracheal tube, left IJ line, NG tube in stable position. Stable cardiomegaly. Diffuse bilateral pulmonary infiltrates/edema again noted. No significant interim change. No pleural effusion or pneumothorax. IMPRESSION: 1.  Lines and tubes in stable position. 2. Diffuse bilateral pulmonary infiltrates/edema again noted without interim change. 3.  Stable cardiomegaly. Electronically Signed   By: Maisie Fus  Register   On: 01/05/2020 06:18   DG Chest Port 1 View  Result Date: 01/04/2020 CLINICAL DATA:  Acute respiratory failure. Endotracheally intubated. EXAM: PORTABLE CHEST 1 VIEW COMPARISON:  01/03/2020 FINDINGS: Support lines and tubes in appropriate position. Bilateral pulmonary airspace disease is again seen which is greatest in the lung bases, without significant interval change. Cardiomegaly remains stable. IMPRESSION: No significant change in  bilateral pulmonary airspace disease and cardiomegaly. Electronically Signed   By: Danae Orleans  M.D.   On: 01/04/2020 08:12   DG CHEST PORT 1 VIEW  Result Date: 01/03/2020 CLINICAL DATA:  Bilateral lung consolidations. EXAM: PORTABLE CHEST 1 VIEW COMPARISON:  January 02, 2020 FINDINGS: Bilateral pulmonary infiltrates persist, significant but mildly improved in the interval. Stable cardiomegaly. The hila and mediastinum are unchanged. No pneumothorax. The ETT is in good position. A new left central line terminates just below the brachiocephalic confluence. An NG tube terminates below today's film. No other acute abnormalities. IMPRESSION: 1. A new left central line is in good position with the tip just below the brachiocephalic confluence. No pneumothorax. 2. Other support apparatus as above. 3. Bilateral pulmonary infiltrates remain at our significant but mildly improved. Electronically Signed   By: Gerome Sam III M.D   On: 01/03/2020 09:19   DG Chest Port 1 View  Result Date: 01/02/2020 CLINICAL DATA:  Hypoxia EXAM: PORTABLE CHEST 1 VIEW COMPARISON:  01/02/2020, 01/01/2019 FINDINGS: Endotracheal tube tip about 3.4 cm superior to the carina. Esophageal tube tip below the diaphragm but incompletely visualized. Cardiomegaly. Extensive bilateral lung consolidations with increasing air bronchograms. No pneumothorax. IMPRESSION: 1. Endotracheal tube tip about 3.4 cm superior to the carina. 2. Extensive bilateral lung consolidations with increasing air bronchograms. Cardiomegaly Electronically Signed   By: Jasmine Pang M.D.   On: 01/02/2020 03:32   DG Chest Port 1 View  Result Date: 01/02/2020 CLINICAL DATA:  Hypoxia EXAM: PORTABLE CHEST 1 VIEW COMPARISON:  01-12-2020 FINDINGS: Endotracheal tube tip is about 3.9 cm superior to the carina. Esophageal tube tip is below the diaphragm but incompletely visualized. Cardiomegaly. Extensive bilateral hazy pulmonary opacity with worsening consolidations. No  pneumothorax. IMPRESSION: 1. Endotracheal tube tip about 3.9 cm superior to carina 2. Cardiomegaly 3. Interval worsening of diffuse bilateral hazy pulmonary opacity and patchy bilateral lung consolidations potentially representing edema or bilateral pneumonia. Electronically Signed   By: Jasmine Pang M.D.   On: 01/02/2020 02:52   DG Chest Portable 1 View  Result Date: 01/12/2020 CLINICAL DATA:  Status post CPR and intubation EXAM: PORTABLE CHEST 1 VIEW COMPARISON:  None. FINDINGS: Cardiac shadow is enlarged. Endotracheal tube and gastric catheter are noted in satisfactory position. Diffuse central vascular congestion is noted without significant edema. No definitive rib fractures are pneumothorax is identified. IMPRESSION: Tubes and lines as described. Central vascular congestion consistent with the recent CPR. Electronically Signed   By: Alcide Clever M.D.   On: 01/12/20 23:57   EEG adult  Result Date: 01/03/2020 Charlsie Quest, MD     01/03/2020  1:28 PM Patient Name: Colin [name] MRN: [id number] Epilepsy Attending: Charlsie Quest Referring Physician/Provider: Dr Levy Pupa Date: 01/03/2020 Duration: 23.33 mins Patient history: 64yo m s/p cardiac arrest. EEG to evaluate for seizure. Level of alertness: comatose AEDs during EEG study: Propofol Technical aspects: This EEG study was done with scalp electrodes positioned according to the 10-20 International system of electrode placement. Electrical activity was acquired at a sampling rate of  and reviewed with a high frequency filter of  and a low frequency filter of . EEG data were recorded continuously and digitally stored. Description: EEG showed continuous monomorphic generalized 5 to 6 Hz theta slowing with brief 1-2 seconds of generalized background attentuation.  Hyperventilation and photic stimulation were not performed.   ABNORMALITY -Continuous slow, generalized IMPRESSION: This study is suggestive of profound diffuse  encephalopathy, nonspecific etiology but likely related to sedation, anoxic/hypoxic brain injury. No seizures or epileptiform discharges were seen throughout the recording. Priyanka  Annabelle Harman   ECHOCARDIOGRAM COMPLETE  Result Date: 01/05/2020    ECHOCARDIOGRAM REPORT   Patient Name:   Colin [name] Date of Exam: 01/05/2020 Medical Rec #:  [id number]      Height:       71.0 in Accession #:    [id number]     Weight:       257.3 lb Date of Birth:  Sep 30, 1955       BSA:          2.347 m Patient Age:    64 years       BP:           131/99 mmHg Patient Gender: M              HR:           102 bpm. Exam Location:  Inpatient Procedure: 2D Echo and Intracardiac Opacification Agent Indications:    Cardiac arrest I46.9  History:        Patient has prior history of Echocardiogram examinations, most                 recent 05/28/2019. Arrythmias:Atrial Fibrillation; Risk                 Factors:Hypertension. History of nonischemic cardiomyopathy.                 Ventricular fibrillation, Acute kidney injury, Anoxic                 encephalopathy.  Sonographer:    Leta Jungling RDCS Referring Phys: [phone] MICHAEL COOPER  Sonographer Comments: Echo performed with patient supine and on artificial respirator. IMPRESSIONS  1. Left ventricular ejection fraction, by estimation, is 35%. The left ventricle has moderately decreased function. The left ventricle demonstrates global hypokinesis. Left ventricular diastolic parameters are indeterminate.  2. Right ventricular systolic function is mildly reduced. The right ventricular size is mildly enlarged. There is moderately elevated pulmonary artery systolic pressure. The estimated right ventricular systolic pressure is 45.0 mmHg.  3. Left atrial size was mildly dilated.  4. Right atrial size was mildly dilated.  5. The mitral valve is normal in structure. Mild mitral valve regurgitation. No evidence of mitral stenosis.  6. The aortic valve was not well visualized. Aortic valve regurgitation  is trivial. No aortic stenosis is present.  7. Aortic dilatation noted. There is mild dilatation of the ascending aorta measuring 43 mm.  8. The inferior vena cava is dilated in size with <50% respiratory variability, suggesting right atrial pressure of 15 mmHg.  9. The patient was in atrial fibrillation. FINDINGS  Left Ventricle: Left ventricular ejection fraction, by estimation, is 35%. The left ventricle has moderately decreased function. The left ventricle demonstrates global hypokinesis. Definity contrast agent was given IV to delineate the left ventricular endocardial borders. The left ventricular internal cavity size was normal in size. There is no left ventricular hypertrophy. Left ventricular diastolic parameters are indeterminate. Right Ventricle: The right ventricular size is mildly enlarged. No increase in right ventricular wall thickness. Right ventricular systolic function is mildly reduced. There is moderately elevated pulmonary artery systolic pressure. The tricuspid regurgitant velocity is 2.74 m/s, and with an assumed right atrial pressure of 15 mmHg, the estimated right ventricular systolic pressure is 45.0 mmHg. Left Atrium: Left atrial size was mildly dilated. Right Atrium: Right atrial size was mildly dilated. Pericardium: There is no evidence of pericardial effusion. Mitral Valve: The mitral valve is normal in structure. Mild mitral annular  calcification. Mild mitral valve regurgitation. No evidence of mitral valve stenosis. Tricuspid Valve: The tricuspid valve is normal in structure. Tricuspid valve regurgitation is trivial. Aortic Valve: The aortic valve was not well visualized. Aortic valve regurgitation is trivial. No aortic stenosis is present. Pulmonic Valve: The pulmonic valve was not well visualized. Pulmonic valve regurgitation is trivial. Aorta: Aortic dilatation noted. There is mild dilatation of the ascending aorta measuring 43 mm. Venous: The inferior vena cava is dilated in size  with less than 50% respiratory variability, suggesting right atrial pressure of 15 mmHg. IAS/Shunts: No atrial level shunt detected by color flow Doppler.  LEFT VENTRICLE PLAX 2D LVIDd:         5.30 cm LVIDs:         4.40 cm LV PW:         0.90 cm LV IVS:        0.90 cm LVOT diam:     2.20 cm LV SV:         44 LV SV Index:   19 LVOT Area:     3.80 cm  LV Volumes (MOD) LV vol d, MOD A2C: 112.0 ml LV vol d, MOD A4C: 114.0 ml LV vol s, MOD A2C: 58.0 ml LV vol s, MOD A4C: 76.5 ml LV SV MOD A2C:     54.0 ml LV SV MOD A4C:     114.0 ml LV SV MOD BP:      46.6 ml RIGHT VENTRICLE TAPSE (M-mode): 1.0 cm LEFT ATRIUM             Index       RIGHT ATRIUM           Index LA diam:        5.00 cm 2.13 cm/m  RA Area:     19.20 cm LA Vol (A2C):   44.3 ml 18.88 ml/m RA Volume:   48.30 ml  20.58 ml/m LA Vol (A4C):   80.6 ml 34.35 ml/m LA Biplane Vol: 60.9 ml 25.95 ml/m  AORTIC VALVE LVOT Vmax:   73.20 cm/s LVOT Vmean:  51.600 cm/s LVOT VTI:    0.116 m  AORTA Ao Root diam: 3.90 cm Ao Asc diam:  3.90 cm MITRAL VALVE              TRICUSPID VALVE MV Area (PHT): 5.81 cm   TR Peak grad:   30.0 mmHg MV Decel Time: 131 msec   TR Vmax:        274.00 cm/s MR Peak grad: 71.2 mmHg MR Mean grad: 52.0 mmHg   SHUNTS MR Vmax:      422.00 cm/s Systemic VTI:  0.12 m MR Vmean:     341.0 cm/s  Systemic Diam: 2.20 cm MV E velocity: 0.94 cm/s Marca Ancona MD Electronically signed by Marca Ancona MD Signature Date/Time: 01/05/2020/5:00:41 PM    Final     Microbiology No results found for this or any previous visit (from the past 240 hour(s)).  Lab Basic Metabolic Panel: No results for input(s): NA, K, CL, CO2, GLUCOSE, BUN, CREATININE, CALCIUM, MG, PHOS in the last 168 hours. Liver Function Tests: No results for input(s): AST, ALT, ALKPHOS, BILITOT, PROT, ALBUMIN in the last 168 hours. No results for input(s): LIPASE, AMYLASE in the last 168 hours. No results for input(s): AMMONIA in the last 168 hours. CBC: No results for input(s):  WBC, NEUTROABS, HGB, HCT, MCV, PLT in the last 168 hours. Cardiac Enzymes: No results for input(s): CKTOTAL, CKMB,  CKMBINDEX, TROPONINI in the last 168 hours. Sepsis Labs: No results for input(s): PROCALCITON, WBC, LATICACIDVEN in the last 168 hours.  Procedures/Operations  Mechanical ventilation.   Laquashia Mergenthaler 01/20/2020, 10:30 AM

## 2020-02-05 DEATH — deceased

## 2020-03-17 ENCOUNTER — Ambulatory Visit: Payer: Commercial Managed Care - PPO | Admitting: Internal Medicine

## 2020-04-19 ENCOUNTER — Other Ambulatory Visit (HOSPITAL_COMMUNITY): Payer: Commercial Managed Care - PPO

## 2020-11-27 IMAGING — CT CT HEAD W/O CM
4 series · 17 of 47 positions shown, 19 images · non-contrast
Comparison: None.

CLINICAL DATA: 64-year-old male with syncope.

EXAM:
CT HEAD WITHOUT CONTRAST
TECHNIQUE: Contiguous axial images were obtained from the base of the skull
through the vertex without intravenous contrast.

[Series 3: head wo · axial · 0.49mm/px · z∈[+1313,+1433]mm · 7 of 34 slices shown, 9 images]
[im 5/34  brain]
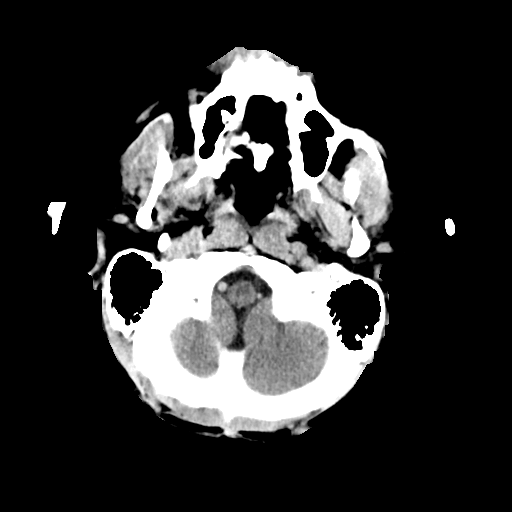
[im 5/34  bone]
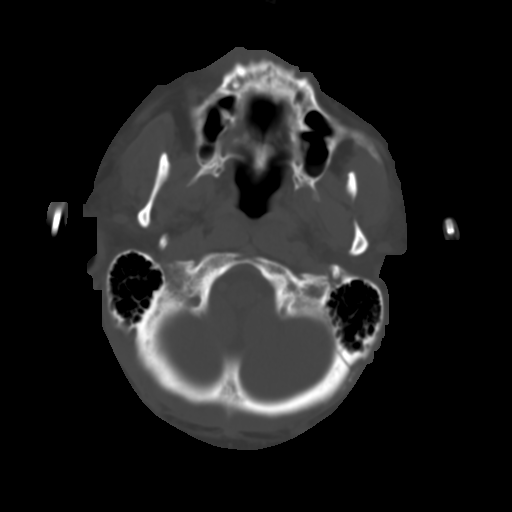
[im 9/34  brain]
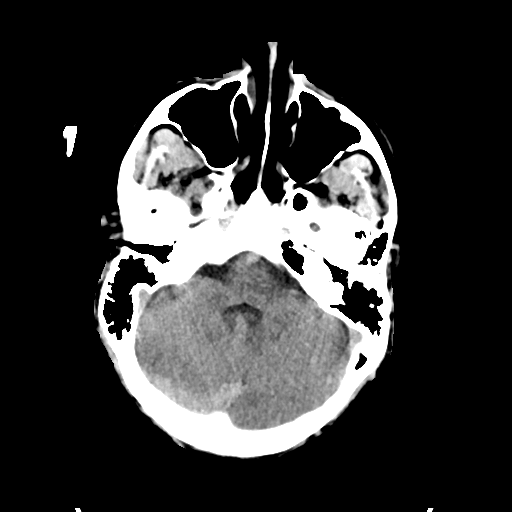
[im 13/34  brain]
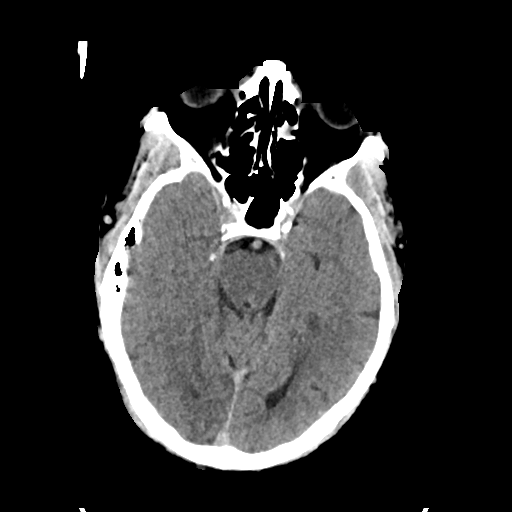
[im 17/34  brain]
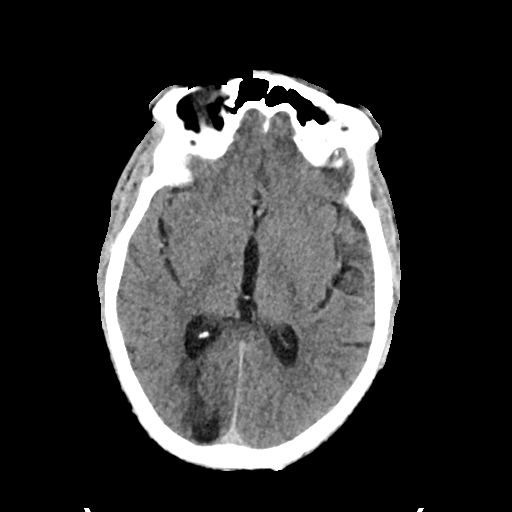
[im 21/34  brain]
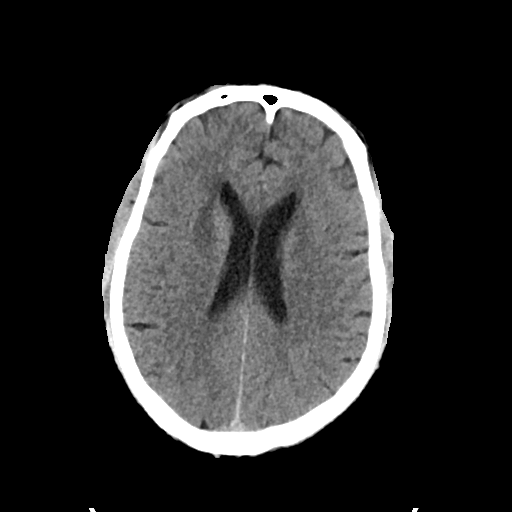
[im 21/34  bone]
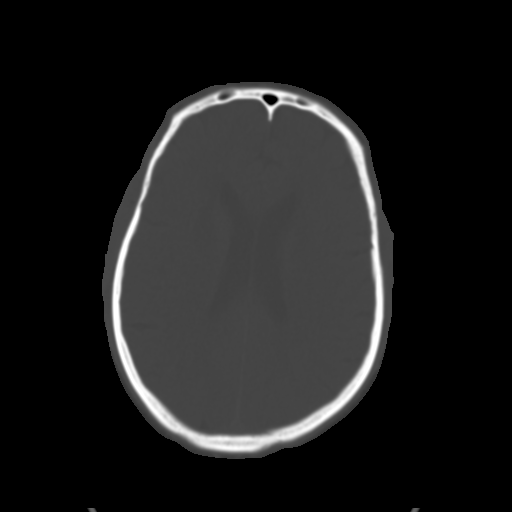
[im 25/34  brain]
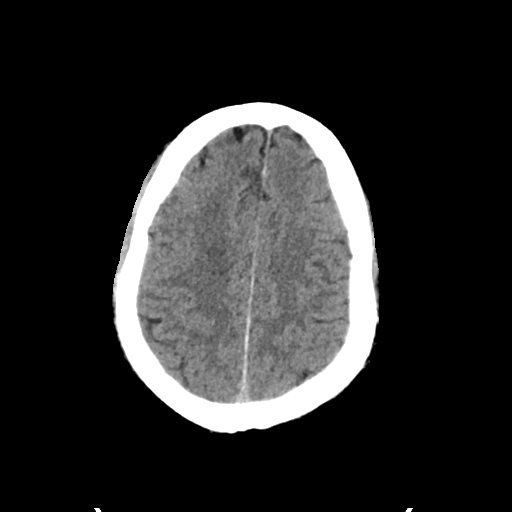
[im 29/34  brain]
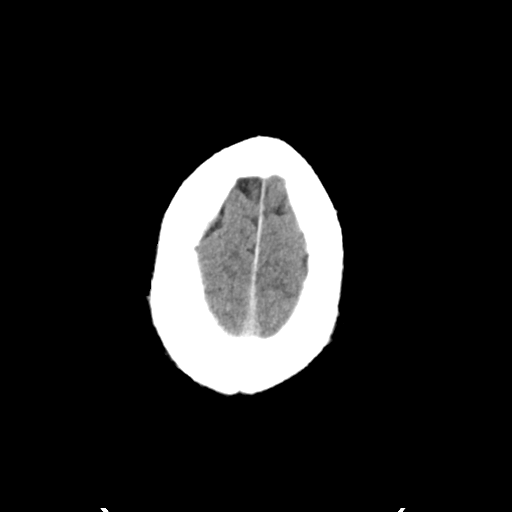

[Series 4: head bone · axial · 0.49mm/px · z∈[+1309,+1365]mm · 4 of 83 slices shown]
[im 9/83  bone]
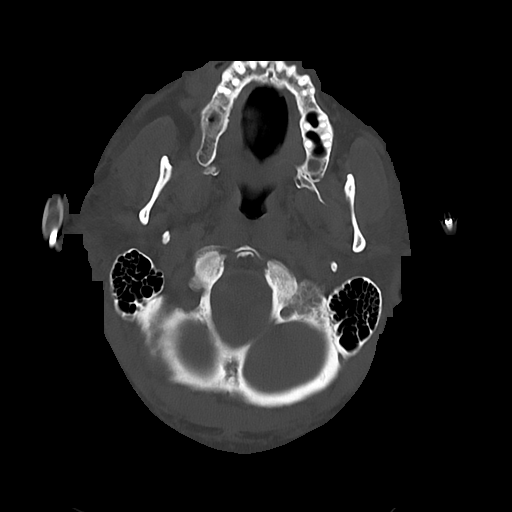
[im 17/83  bone]
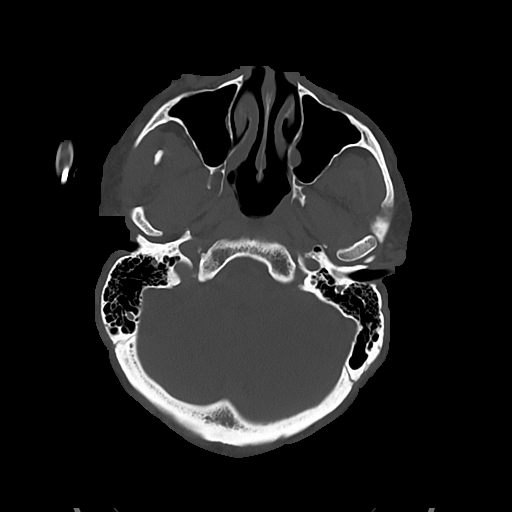
[im 25/83  bone]
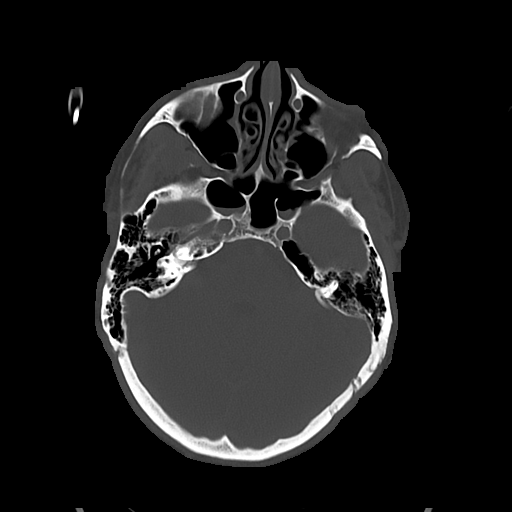
[im 37/83  bone]
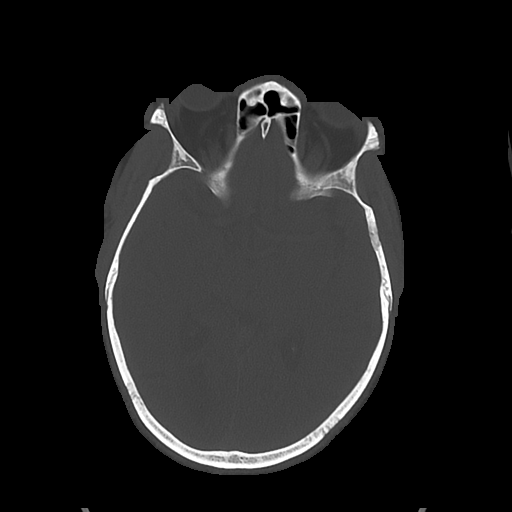

[Series 5: cor soft · coronal · 0.39mm/px · 3 of 81 slices shown]
[im 27/81  brain]
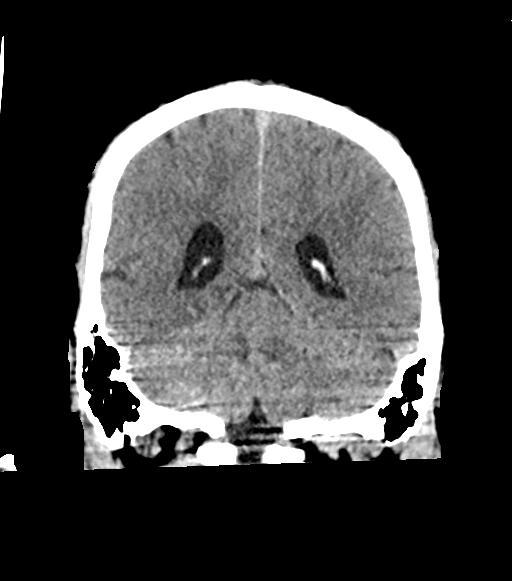
[im 36/81  brain]
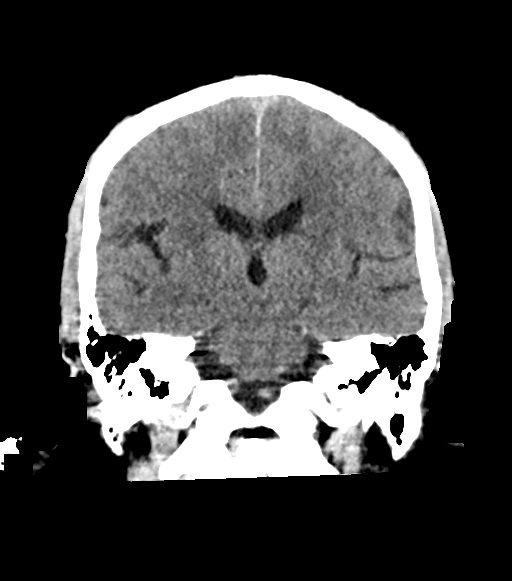
[im 45/81  brain]
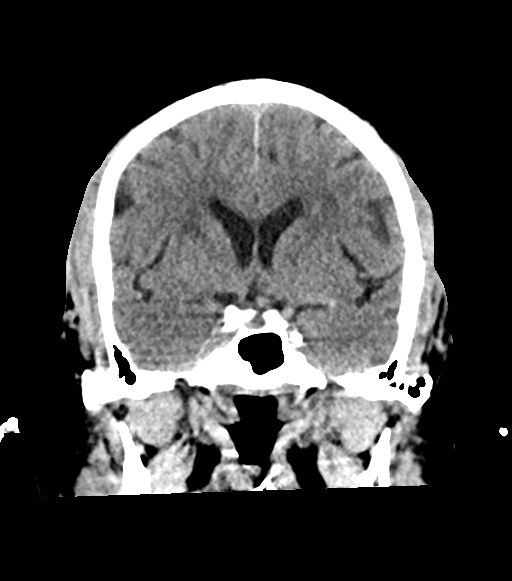

[Series 6: sag soft · sagittal · 0.45mm/px · 3 of 67 slices shown]
[im 23/67  brain]
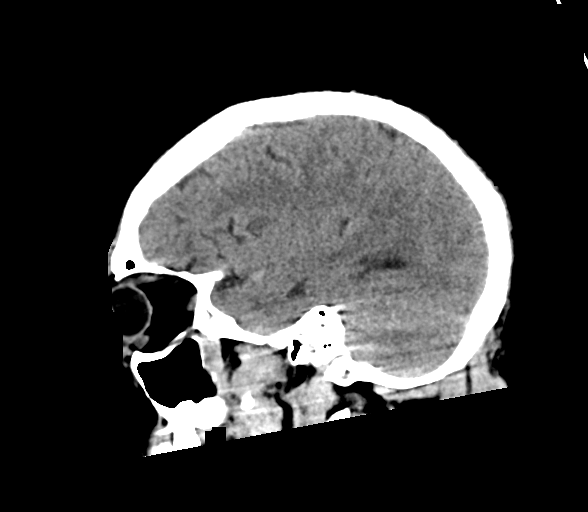
[im 34/67  brain]
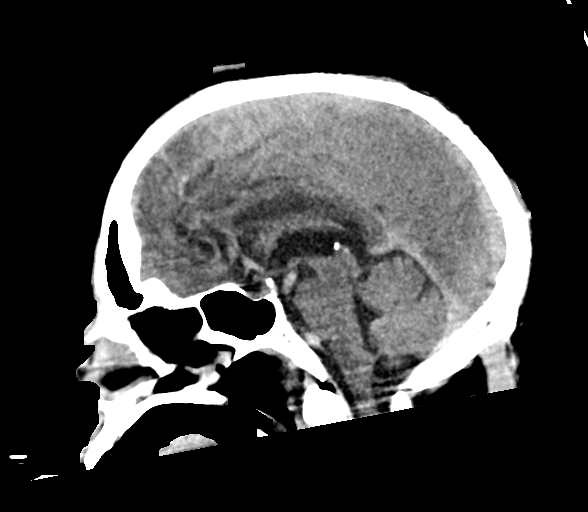
[im 45/67  brain]
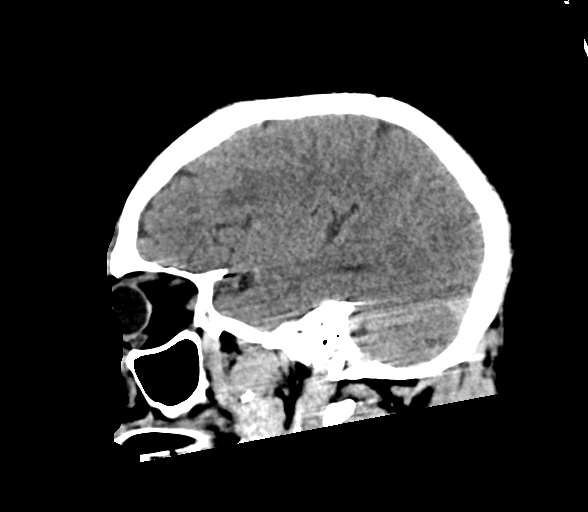

[17 of 47 positions shown; findings below may reference images not displayed]

FINDINGS: Brain: Mild age-related atrophy and chronic microvascular ischemic
changes. Old right occipital infarct. The false appears slightly
prominent, possibly related to angulation. A trace parafalcine
subdural hemorrhage is less likely. No other acute hemorrhage. No
mass effect or midline shift.

Vascular: High attenuation of the intracranial vasculature, likely
hemoconcentration/dehydration.

Skull: Normal. Negative for fracture or focal lesion.

Sinuses/Orbits: Partial opacification of the sphenoid sinuses with
air-fluid level. The mastoid air cells are clear.

Other: An endotracheal and an enteric tube are partially visualized.
IMPRESSION: 1. No acute intracranial pathology.
2. Mild age-related atrophy and chronic microvascular ischemic
changes. Old right occipital infarct.

## 2020-11-30 IMAGING — DX DG CHEST 1V PORT
1 series · 1 of 1 positions shown · non-contrast
Comparison: 01/04/2020.

CLINICAL DATA: Intubation.  Respiratory failure.

EXAM:
PORTABLE CHEST 1 VIEW

[chest ap]
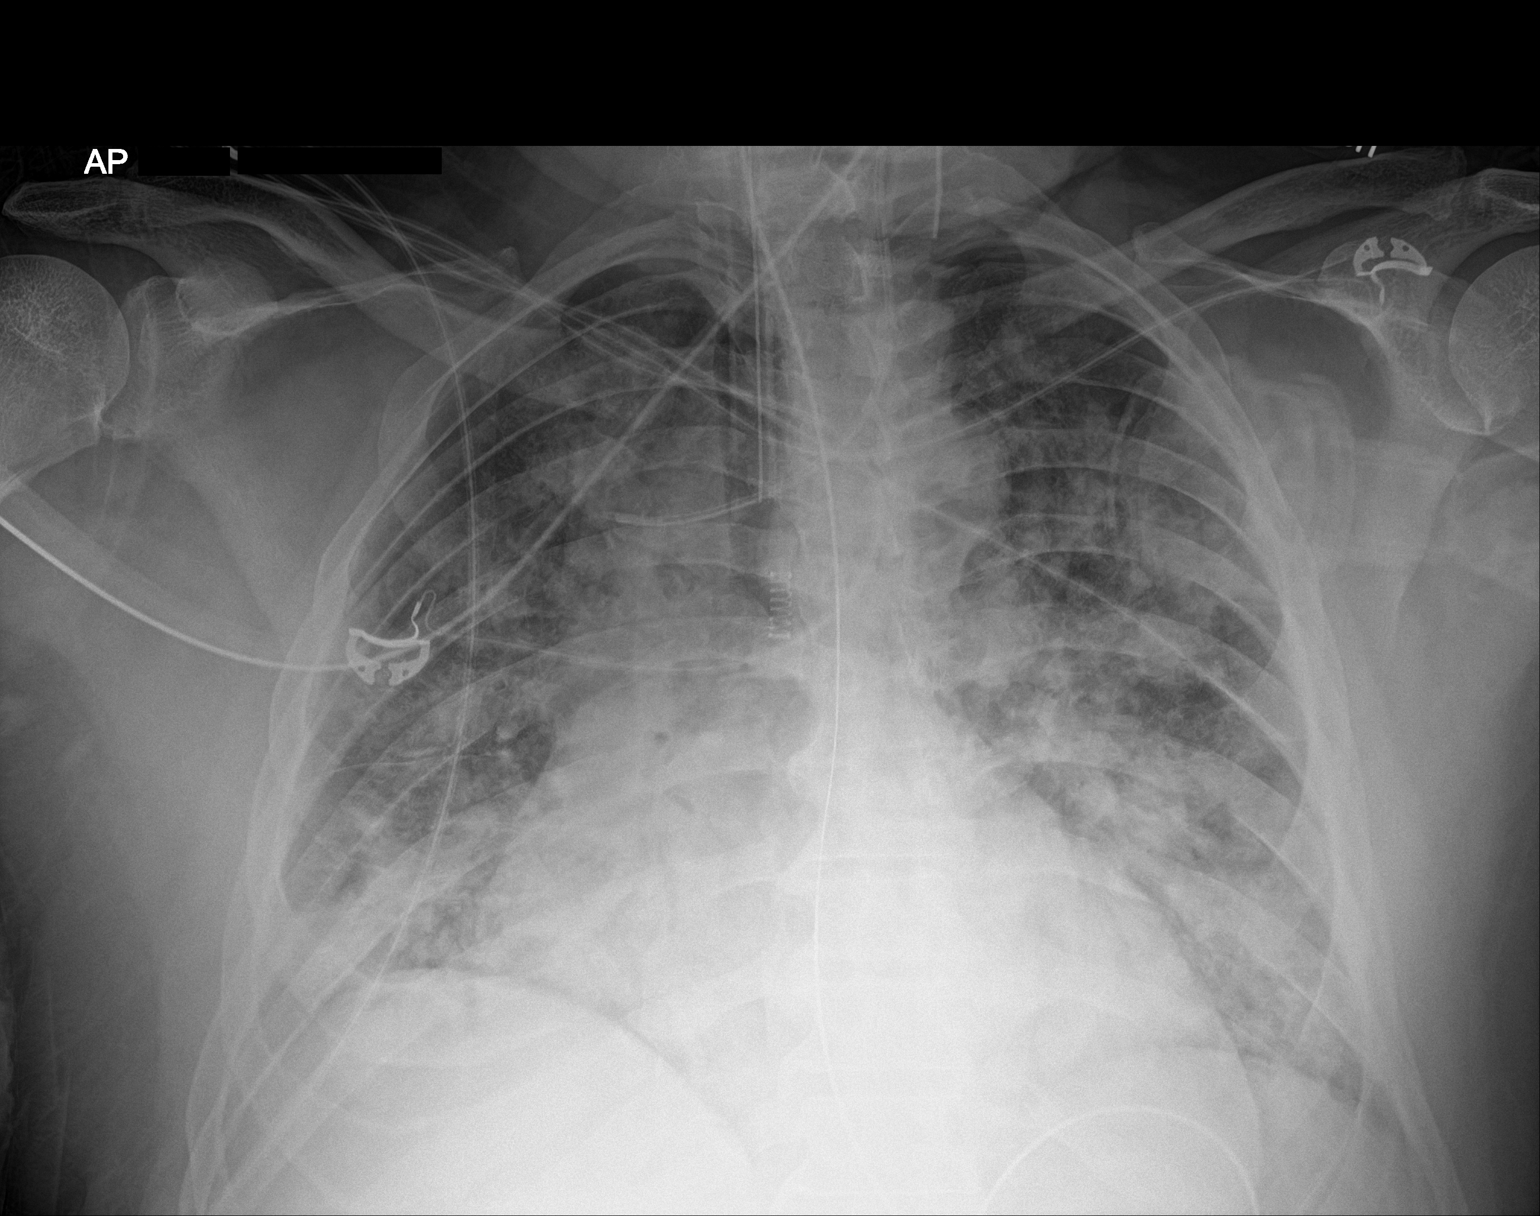

[1 of 1 positions shown; findings below may reference images not displayed]

FINDINGS: Endotracheal tube, left IJ line, NG tube in stable position. Stable
cardiomegaly. Diffuse bilateral pulmonary infiltrates/edema again
noted. No significant interim change. No pleural effusion or
pneumothorax.
IMPRESSION: 1.  Lines and tubes in stable position.

2. Diffuse bilateral pulmonary infiltrates/edema again noted without
interim change.

3.  Stable cardiomegaly.

## 2020-12-01 IMAGING — DX DG CHEST 1V PORT
1 series · 1 of 1 positions shown · non-contrast
Comparison: Multiple recent previous exams.

CLINICAL DATA: Respiratory failure with ventilator dependence.

EXAM:
PORTABLE CHEST 1 VIEW

[chest ap]
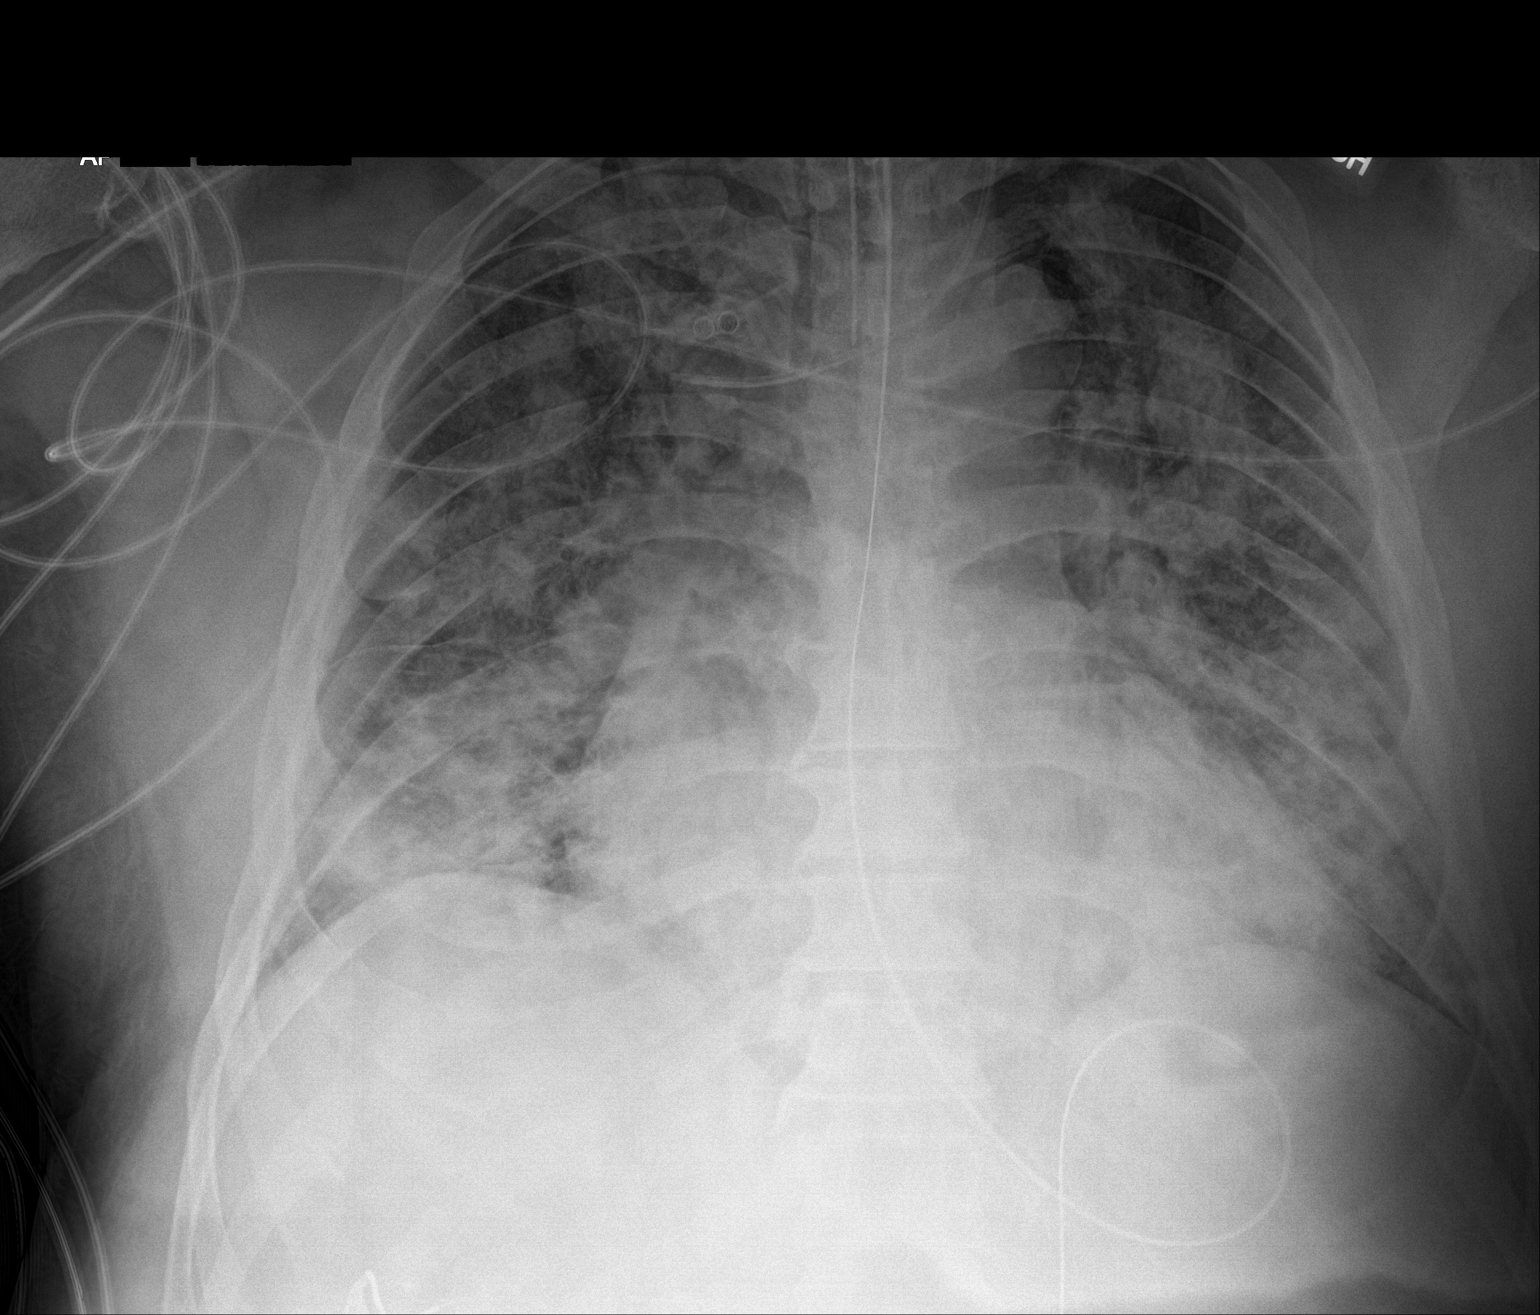

[1 of 1 positions shown; findings below may reference images not displayed]

FINDINGS: 8318 hours. Endotracheal tube tip is 4.7 cm above the base of the
carina. Diffuse bilateral airspace disease is similar to prior. The
cardio pericardial silhouette is enlarged. The NG tube passes into
the stomach although the distal tip position is not included on the
film. Left IJ central line tip overlies the innominate vein
confluence. Telemetry leads overlie the chest.
IMPRESSION: No substantial interval change in exam. Diffuse bilateral airspace
disease is similar to prior.

## 2020-12-04 IMAGING — DX DG CHEST 1V PORT
1 series · 1 of 1 positions shown · non-contrast
Comparison: Two days ago

CLINICAL DATA: Respiratory acidosis

EXAM:
PORTABLE CHEST 1 VIEW

[chest]
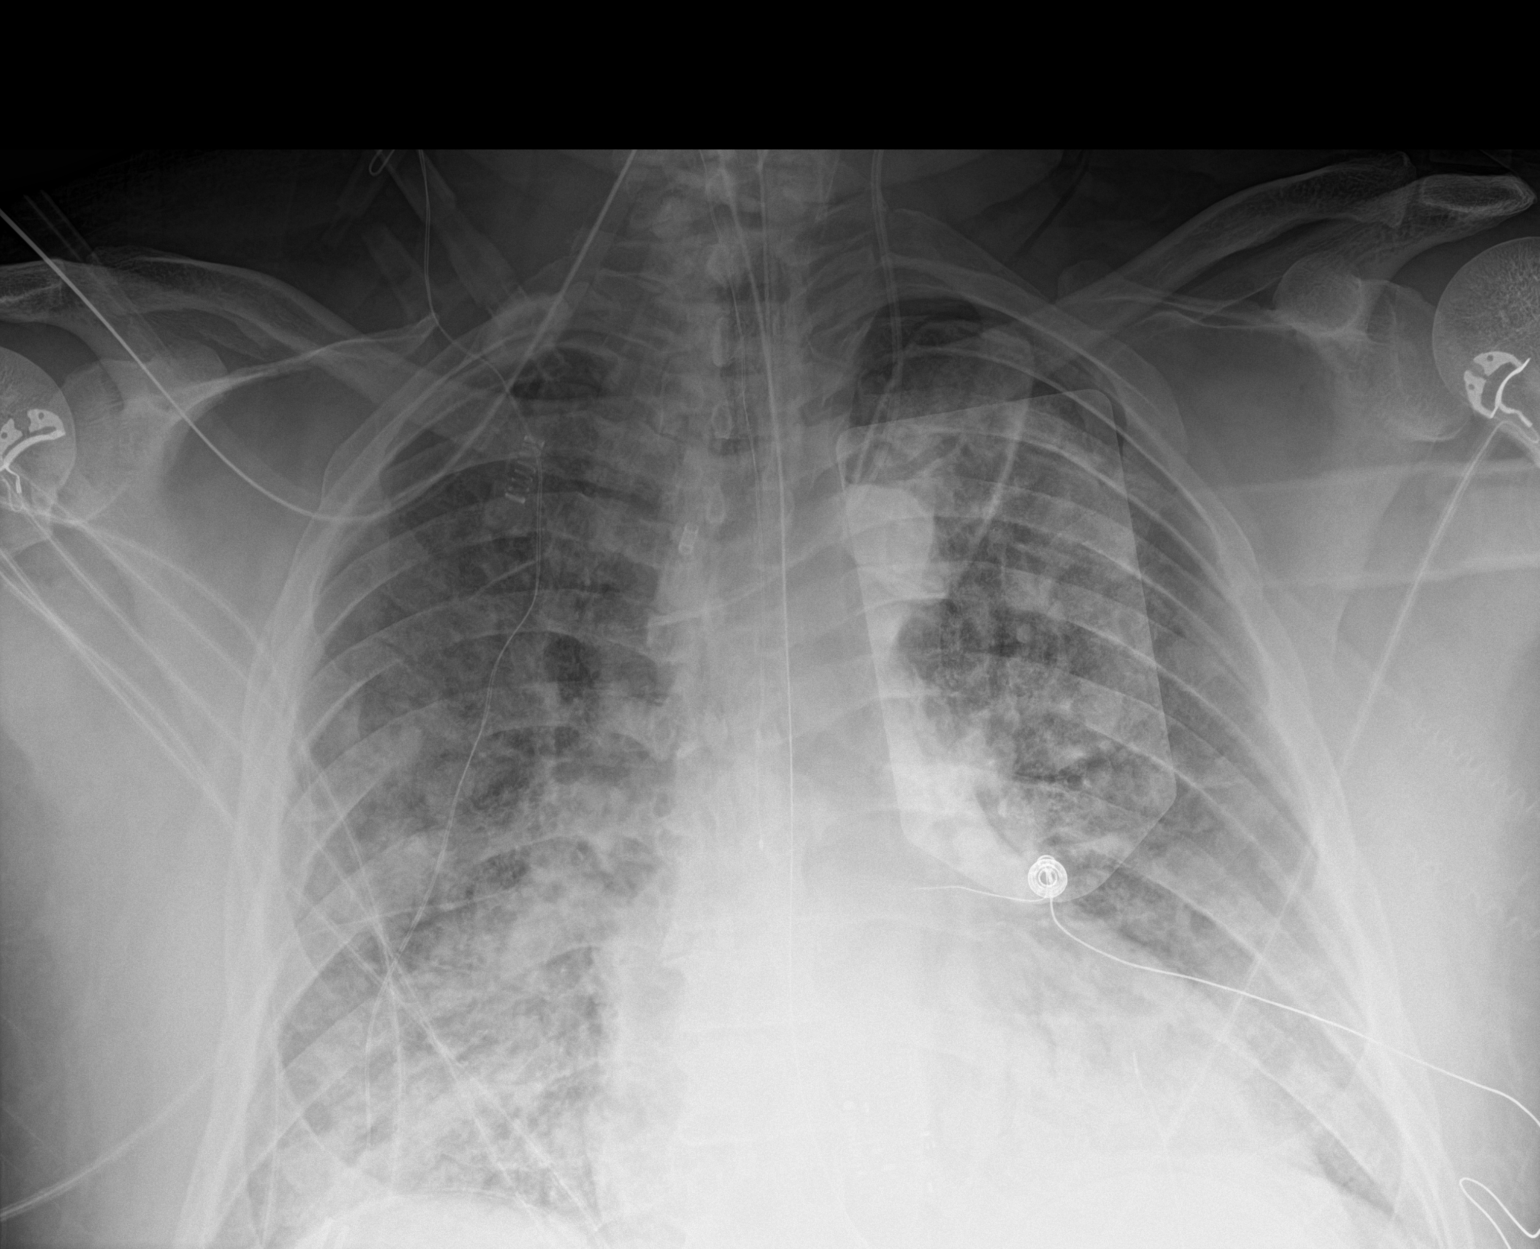

[1 of 1 positions shown; findings below may reference images not displayed]

FINDINGS: The endotracheal tube tip projects at the clavicular heads. The
enteric tube reaches the stomach. Left IJ line with tip at the SVC
origin. Stable heart size which is generous. Extensive airspace
disease that is not progressed. No visible effusion or pneumothorax.
IMPRESSION: Stable hardware positioning and extensive airspace disease.

## 2020-12-15 ENCOUNTER — Encounter: Payer: Commercial Managed Care - PPO | Admitting: Internal Medicine
